# Patient Record
Sex: Male | Born: 1975 | Race: White | Hispanic: No | Marital: Married | Smoking: Never smoker
Health system: Southern US, Community
[De-identification: ages and names within clinical notes are randomized; demographics above are authoritative.]

## PROBLEM LIST (undated history)

## (undated) DIAGNOSIS — I10 Essential (primary) hypertension: Secondary | ICD-10-CM

## (undated) DIAGNOSIS — Z803 Family history of malignant neoplasm of breast: Secondary | ICD-10-CM

## (undated) DIAGNOSIS — C801 Malignant (primary) neoplasm, unspecified: Secondary | ICD-10-CM

## (undated) HISTORY — DX: Malignant (primary) neoplasm, unspecified: C80.1

## (undated) HISTORY — DX: Family history of malignant neoplasm of breast: Z80.3

---

## 2017-03-05 DIAGNOSIS — Z23 Encounter for immunization: Secondary | ICD-10-CM | POA: Diagnosis not present

## 2017-07-24 DIAGNOSIS — M9902 Segmental and somatic dysfunction of thoracic region: Secondary | ICD-10-CM | POA: Diagnosis not present

## 2017-07-24 DIAGNOSIS — M542 Cervicalgia: Secondary | ICD-10-CM | POA: Diagnosis not present

## 2017-07-24 DIAGNOSIS — M9901 Segmental and somatic dysfunction of cervical region: Secondary | ICD-10-CM | POA: Diagnosis not present

## 2017-07-28 DIAGNOSIS — M9902 Segmental and somatic dysfunction of thoracic region: Secondary | ICD-10-CM | POA: Diagnosis not present

## 2017-07-28 DIAGNOSIS — M542 Cervicalgia: Secondary | ICD-10-CM | POA: Diagnosis not present

## 2017-07-28 DIAGNOSIS — M9901 Segmental and somatic dysfunction of cervical region: Secondary | ICD-10-CM | POA: Diagnosis not present

## 2017-07-31 DIAGNOSIS — M542 Cervicalgia: Secondary | ICD-10-CM | POA: Diagnosis not present

## 2017-07-31 DIAGNOSIS — M9902 Segmental and somatic dysfunction of thoracic region: Secondary | ICD-10-CM | POA: Diagnosis not present

## 2017-07-31 DIAGNOSIS — M9901 Segmental and somatic dysfunction of cervical region: Secondary | ICD-10-CM | POA: Diagnosis not present

## 2017-11-05 DIAGNOSIS — Z0000 Encounter for general adult medical examination without abnormal findings: Secondary | ICD-10-CM | POA: Diagnosis not present

## 2017-11-05 DIAGNOSIS — Z23 Encounter for immunization: Secondary | ICD-10-CM | POA: Diagnosis not present

## 2017-11-12 DIAGNOSIS — Z0000 Encounter for general adult medical examination without abnormal findings: Secondary | ICD-10-CM | POA: Diagnosis not present

## 2018-03-16 DIAGNOSIS — Z23 Encounter for immunization: Secondary | ICD-10-CM | POA: Diagnosis not present

## 2018-04-02 DIAGNOSIS — R198 Other specified symptoms and signs involving the digestive system and abdomen: Secondary | ICD-10-CM | POA: Diagnosis not present

## 2018-04-02 DIAGNOSIS — K649 Unspecified hemorrhoids: Secondary | ICD-10-CM | POA: Diagnosis not present

## 2018-04-04 ENCOUNTER — Encounter (HOSPITAL_COMMUNITY): Payer: Self-pay | Admitting: Internal Medicine

## 2018-04-04 ENCOUNTER — Inpatient Hospital Stay (HOSPITAL_COMMUNITY)
Admission: EM | Admit: 2018-04-04 | Discharge: 2018-04-11 | DRG: 330 | Disposition: A | Discharge status: Home / Self Care | Payer: 59 | Attending: General Surgery | Admitting: General Surgery

## 2018-04-04 ENCOUNTER — Other Ambulatory Visit: Payer: Self-pay

## 2018-04-04 DIAGNOSIS — K922 Gastrointestinal hemorrhage, unspecified: Secondary | ICD-10-CM | POA: Diagnosis present

## 2018-04-04 DIAGNOSIS — C187 Malignant neoplasm of sigmoid colon: Secondary | ICD-10-CM | POA: Diagnosis not present

## 2018-04-04 DIAGNOSIS — C188 Malignant neoplasm of overlapping sites of colon: Secondary | ICD-10-CM | POA: Diagnosis not present

## 2018-04-04 DIAGNOSIS — K594 Anal spasm: Secondary | ICD-10-CM | POA: Diagnosis present

## 2018-04-04 DIAGNOSIS — Z79899 Other long term (current) drug therapy: Secondary | ICD-10-CM | POA: Diagnosis not present

## 2018-04-04 DIAGNOSIS — D649 Anemia, unspecified: Secondary | ICD-10-CM | POA: Diagnosis not present

## 2018-04-04 DIAGNOSIS — C186 Malignant neoplasm of descending colon: Secondary | ICD-10-CM | POA: Diagnosis not present

## 2018-04-04 DIAGNOSIS — C19 Malignant neoplasm of rectosigmoid junction: Principal | ICD-10-CM | POA: Diagnosis present

## 2018-04-04 DIAGNOSIS — D490 Neoplasm of unspecified behavior of digestive system: Secondary | ICD-10-CM | POA: Diagnosis not present

## 2018-04-04 DIAGNOSIS — D62 Acute posthemorrhagic anemia: Secondary | ICD-10-CM | POA: Diagnosis not present

## 2018-04-04 DIAGNOSIS — E876 Hypokalemia: Secondary | ICD-10-CM

## 2018-04-04 DIAGNOSIS — F419 Anxiety disorder, unspecified: Secondary | ICD-10-CM | POA: Diagnosis present

## 2018-04-04 DIAGNOSIS — K921 Melena: Secondary | ICD-10-CM | POA: Diagnosis not present

## 2018-04-04 DIAGNOSIS — R55 Syncope and collapse: Secondary | ICD-10-CM | POA: Diagnosis not present

## 2018-04-04 DIAGNOSIS — K625 Hemorrhage of anus and rectum: Secondary | ICD-10-CM | POA: Diagnosis not present

## 2018-04-04 DIAGNOSIS — K648 Other hemorrhoids: Secondary | ICD-10-CM | POA: Diagnosis present

## 2018-04-04 DIAGNOSIS — D123 Benign neoplasm of transverse colon: Secondary | ICD-10-CM | POA: Diagnosis not present

## 2018-04-04 DIAGNOSIS — K5669 Other partial intestinal obstruction: Secondary | ICD-10-CM | POA: Diagnosis not present

## 2018-04-04 LAB — CBC
HCT: 26.4 % — ABNORMAL LOW (ref 39.0–52.0)
Hemoglobin: 8.2 g/dL — ABNORMAL LOW (ref 13.0–17.0)
MCH: 25.7 pg — ABNORMAL LOW (ref 26.0–34.0)
MCHC: 31.1 g/dL (ref 30.0–36.0)
MCV: 82.8 fL (ref 80.0–100.0)
NRBC: 0 % (ref 0.0–0.2)
PLATELETS: 202 10*3/uL (ref 150–400)
RBC: 3.19 MIL/uL — ABNORMAL LOW (ref 4.22–5.81)
RDW: 12.4 % (ref 11.5–15.5)
WBC: 11.8 10*3/uL — ABNORMAL HIGH (ref 4.0–10.5)

## 2018-04-04 LAB — COMPREHENSIVE METABOLIC PANEL
ALK PHOS: 47 U/L (ref 38–126)
ALT: 14 U/L (ref 0–44)
ANION GAP: 9 (ref 5–15)
AST: 18 U/L (ref 15–41)
Albumin: 3.9 g/dL (ref 3.5–5.0)
BILIRUBIN TOTAL: 0.4 mg/dL (ref 0.3–1.2)
BUN: 15 mg/dL (ref 6–20)
CALCIUM: 8.5 mg/dL — AB (ref 8.9–10.3)
CO2: 23 mmol/L (ref 22–32)
Chloride: 103 mmol/L (ref 98–111)
Creatinine, Ser: 1.17 mg/dL (ref 0.61–1.24)
Glucose, Bld: 123 mg/dL — ABNORMAL HIGH (ref 70–99)
Potassium: 3.4 mmol/L — ABNORMAL LOW (ref 3.5–5.1)
Sodium: 135 mmol/L (ref 135–145)
TOTAL PROTEIN: 6.8 g/dL (ref 6.5–8.1)

## 2018-04-04 LAB — ABO/RH: ABO/RH(D): A POS

## 2018-04-04 LAB — I-STAT CHEM 8, ED
BUN: 13 mg/dL (ref 6–20)
CALCIUM ION: 1.13 mmol/L — AB (ref 1.15–1.40)
CHLORIDE: 103 mmol/L (ref 98–111)
CREATININE: 1.2 mg/dL (ref 0.61–1.24)
GLUCOSE: 121 mg/dL — AB (ref 70–99)
HCT: 24 % — ABNORMAL LOW (ref 39.0–52.0)
Hemoglobin: 8.2 g/dL — ABNORMAL LOW (ref 13.0–17.0)
Potassium: 3.5 mmol/L (ref 3.5–5.1)
Sodium: 138 mmol/L (ref 135–145)
TCO2: 23 mmol/L (ref 22–32)

## 2018-04-04 LAB — PREPARE RBC (CROSSMATCH)

## 2018-04-04 LAB — RETICULOCYTES
IMMATURE RETIC FRACT: 29.3 % — AB (ref 2.3–15.9)
RBC.: 3.11 MIL/uL — AB (ref 4.22–5.81)
RETIC CT PCT: 2.7 % (ref 0.4–3.1)
Retic Count, Absolute: 84.3 10*3/uL (ref 19.0–186.0)

## 2018-04-04 LAB — TROPONIN I

## 2018-04-04 MED ORDER — SODIUM CHLORIDE 0.9 % IV SOLN
10.0000 mL/h | Freq: Once | INTRAVENOUS | Status: AC
  Administered 2018-04-04: 10 mL/h via INTRAVENOUS

## 2018-04-04 MED ORDER — SODIUM CHLORIDE 0.9% IV SOLUTION: Freq: Once | INTRAVENOUS | Status: DC

## 2018-04-04 MED ORDER — ACETAMINOPHEN 650 MG RE SUPP: 650.0000 mg | Freq: Four times a day (QID) | RECTAL | Status: DC | PRN

## 2018-04-04 MED ORDER — HYDROCODONE-ACETAMINOPHEN 5-325 MG PO TABS
1.0000 | ORAL_TABLET | ORAL | Status: DC | PRN
  Administered 2018-04-06 (×2): 2 via ORAL
  Filled 2018-04-04 (×2): qty 2

## 2018-04-04 MED ORDER — POTASSIUM CHLORIDE CRYS ER 20 MEQ PO TBCR
40.0000 meq | EXTENDED_RELEASE_TABLET | Freq: Once | ORAL | Status: AC
  Administered 2018-04-04: 40 meq via ORAL
  Filled 2018-04-04: qty 2

## 2018-04-04 MED ORDER — ACETAMINOPHEN 325 MG PO TABS: 650.0000 mg | ORAL_TABLET | Freq: Four times a day (QID) | ORAL | Status: DC | PRN

## 2018-04-04 MED ORDER — ONDANSETRON HCL 4 MG/2ML IJ SOLN: 4.0000 mg | Freq: Four times a day (QID) | INTRAMUSCULAR | Status: DC | PRN

## 2018-04-04 MED ORDER — SODIUM CHLORIDE 0.9 % IV BOLUS
1000.0000 mL | Freq: Once | INTRAVENOUS | Status: AC
  Administered 2018-04-04: 1000 mL via INTRAVENOUS

## 2018-04-04 MED ORDER — ONDANSETRON HCL 4 MG PO TABS: 4.0000 mg | ORAL_TABLET | Freq: Four times a day (QID) | ORAL | Status: DC | PRN

## 2018-04-04 NOTE — ED Provider Notes (Signed)
[facility] DEPT Provider Note   CSN: [id number] Arrival date & time: 04/04/18  1923     History   Chief Complaint Chief Complaint  Patient presents with  . Rectal Bleeding    HPI Charles Rowland is a 42 y.o. male.  HPI Patient has been passing bright red blood per rectum for the past 3 days.  Had a witnessed syncopal episode on the toilet today while passing blood.  Last roughly 30 seconds.  No trauma.  Patient currently is denying any abdominal pain.  States he has episodic lightheadedness especially with standing.  Has never seen a gastroenterologist.  Patient Active Problem List   Diagnosis Date Noted  . Rectal bleed 04/05/2018  . Bright red blood per rectum 04/04/2018  . Lower GI bleed 04/04/2018          Home Medications    Prior to Admission medications   Medication Sig Start Date End Date Taking? Authorizing Provider  hydrocortisone 2.5 % cream Apply 1 application topically as needed for irritation. 04/02/18  Yes [provider]  ibuprofen (ADVIL,MOTRIN) 200 MG tablet Take 400 mg by mouth every 6 (six) hours as needed for moderate pain.   Yes [provider]  Lactobacillus (PROBIOTIC ACIDOPHILUS PO) Take 1 tablet by mouth daily.   Yes [provider]  psyllium (REGULOID) 0.52 g capsule Take 0.52 g by mouth daily.   Yes [provider]    Family History Family History  Problem Relation Age of Onset  . Lung cancer Other   . CAD Neg Hx   . Stroke Neg Hx   . Hypertension Neg Hx     Social History Social History   Tobacco Use  . Smoking status: Never Smoker  . Smokeless tobacco: Never Used  Substance Use Topics  . Alcohol use: Yes    Comment: occasional  . Drug use: Never     Allergies   Patient has no known allergies.   Review of Systems Review of Systems  Constitutional: Positive for fatigue. Negative for chills and fever.  HENT: Negative for trouble swallowing.   Eyes:  Negative for visual disturbance.  Respiratory: Negative for cough, shortness of breath and wheezing.   Cardiovascular: Negative for chest pain, palpitations and leg swelling.  Gastrointestinal: Positive for anal bleeding and blood in stool. Negative for abdominal pain, constipation, diarrhea, nausea and vomiting.  Genitourinary: Negative for dysuria, flank pain and frequency.  Musculoskeletal: Negative for back pain, myalgias, neck pain and neck stiffness.  Skin: Positive for pallor. Negative for rash and wound.  Neurological: Positive for syncope and light-headedness. Negative for weakness, numbness and headaches.  All other systems reviewed and are negative.    Physical Exam Updated Vital Signs BP (!) 149/89 (BP Location: Left Arm)   Pulse (!) 105   Temp 98.5 F (36.9 C) (Oral)   Resp 16   Ht 5\' 10"  (1.778 m)   Wt 88.7 kg   SpO2 95%   BMI 28.06 kg/m   Physical Exam  Constitutional: He is oriented to person, place, and time. He appears well-developed and well-nourished. No distress.  HENT:  Head: Normocephalic and atraumatic.  Mouth/Throat: Oropharynx is clear and moist. No oropharyngeal exudate.  Pale mucous membranes  Eyes: Pupils are equal, round, and reactive to light. EOM are normal.  Neck: Normal range of motion. Neck supple. No JVD present.  Cardiovascular: Normal rate and regular rhythm. Exam reveals no gallop and no friction rub.  No murmur heard. Pulmonary/Chest:  Effort normal and breath sounds normal.  Abdominal: Soft. Bowel sounds are normal. There is no tenderness. There is no rebound and no guarding.  Genitourinary:  Genitourinary Comments: No obvious fissures or bleeding hemorrhoids.  Rectal vault is clear.  Musculoskeletal: Normal range of motion. He exhibits no edema or tenderness.  Lymphadenopathy:    He has no cervical adenopathy.  Neurological: He is alert and oriented to person, place, and time.  Moving all extremities without focal deficit.  Sensation  intact.  Skin: Skin is warm and dry. Capillary refill takes less than 2 seconds. No rash noted. No erythema. There is pallor.  Psychiatric: He has a normal mood and affect. His behavior is normal.  Nursing note and vitals reviewed.    ED Treatments / Results  Labs (all labs ordered are listed, but only abnormal results are displayed) Labs Reviewed  COMPREHENSIVE METABOLIC PANEL - Abnormal; Notable for the following components:      Result Value   Potassium 3.4 (*)    Glucose, Bld 123 (*)    Calcium 8.5 (*)    All other components within normal limits  CBC - Abnormal; Notable for the following components:   WBC 11.8 (*)    RBC 3.19 (*)    Hemoglobin 8.2 (*)    HCT 26.4 (*)    MCH 25.7 (*)    All other components within normal limits  FERRITIN - Abnormal; Notable for the following components:   Ferritin 6 (*)    All other components within normal limits  IRON AND TIBC - Abnormal; Notable for the following components:   Iron 21 (*)    Saturation Ratios 6 (*)    All other components within normal limits  RETICULOCYTES - Abnormal; Notable for the following components:   RBC. 3.11 (*)    Immature Retic Fract 29.3 (*)    All other components within normal limits  COMPREHENSIVE METABOLIC PANEL - Abnormal; Notable for the following components:   Calcium 8.2 (*)    Total Protein 6.0 (*)    Albumin 3.4 (*)    All other components within normal limits  CBC - Abnormal; Notable for the following components:   RBC 3.21 (*)    Hemoglobin 8.6 (*)    HCT 27.2 (*)    All other components within normal limits  CBC - Abnormal; Notable for the following components:   RBC 3.37 (*)    Hemoglobin 9.1 (*)    HCT 28.5 (*)    All other components within normal limits  CBC - Abnormal; Notable for the following components:   RBC 3.46 (*)    Hemoglobin 9.2 (*)    HCT 29.1 (*)    All other components within normal limits  CBC - Abnormal; Notable for the following components:   RBC 3.48 (*)      Hemoglobin 9.4 (*)    HCT 29.5 (*)    All other components within normal limits  CBC - Abnormal; Notable for the following components:   RBC 3.28 (*)    Hemoglobin 8.8 (*)    HCT 27.4 (*)    All other components within normal limits  CBC - Abnormal; Notable for the following components:   RBC 3.52 (*)    Hemoglobin 9.4 (*)    HCT 29.6 (*)    All other components within normal limits  CBC - Abnormal; Notable for the following components:   RBC 3.40 (*)    Hemoglobin 9.1 (*)    HCT  28.9 (*)    All other components within normal limits  CBC - Abnormal; Notable for the following components:   RBC 3.32 (*)    Hemoglobin 8.9 (*)    HCT 28.4 (*)    All other components within normal limits  BASIC METABOLIC PANEL - Abnormal; Notable for the following components:   Glucose, Bld 116 (*)    Calcium 8.8 (*)    All other components within normal limits  I-STAT CHEM 8, ED - Abnormal; Notable for the following components:   Glucose, Bld 121 (*)    Calcium, Ion 1.13 (*)    Hemoglobin 8.2 (*)    HCT 24.0 (*)    All other components within normal limits  MRSA PCR SCREENING  FOLATE  TROPONIN I  VITAMIN B12  TROPONIN I  TROPONIN I  MAGNESIUM  PHOSPHORUS  TSH  HIV ANTIBODY (ROUTINE TESTING W REFLEX)  TROPONIN I  CEA  HEMOGLOBIN A1C  CBC  CREATININE, SERUM  TYPE AND SCREEN  PREPARE RBC (CROSSMATCH)  ABO/RH  SURGICAL PATHOLOGY  SURGICAL PATHOLOGY    EKG EKG Interpretation  Date/Time:  Saturday April 04 2018 20:48:06 EDT Ventricular Rate:  89 PR Interval:    QRS Duration: 111 QT Interval:  370 QTC Calculation: 451 R Axis:   66 Text Interpretation:  Sinus rhythm No previous ECGs available Confirmed by Theotis Burrow [phone]) on 04/05/2018 5:04:09 PM   Radiology Ct Chest W Contrast  Result Date: 04/06/2018 CLINICAL DATA:  42 year old male with newly diagnosed rectal mass at colonoscopy today. Evaluate for metastatic disease. EXAM: CT CHEST, ABDOMEN, AND PELVIS WITH  CONTRAST TECHNIQUE: Multidetector CT imaging of the chest, abdomen and pelvis was performed following the standard protocol during bolus administration of intravenous contrast. CONTRAST:  160mL OMNIPAQUE IOHEXOL 300 MG/ML  SOLN COMPARISON:  None. FINDINGS: CT CHEST FINDINGS Cardiovascular: No significant vascular findings. Normal heart size. No pericardial effusion. Mediastinum/Nodes: No enlarged mediastinal, hilar, or axillary lymph nodes. Thyroid gland, trachea, and esophagus demonstrate no significant findings. Lungs/Pleura: Lungs are clear. No pulmonary nodules or masses. No pleural effusion or pneumothorax. Musculoskeletal: No chest wall mass or suspicious bone lesions identified. CT ABDOMEN PELVIS FINDINGS Hepatobiliary: The liver and gallbladder are unremarkable. No focal hepatic lesions identified. No biliary dilatation. Pancreas: Unremarkable Spleen: Unremarkable Adrenals/Urinary Tract: The kidneys, adrenal glands and bladder are unremarkable except for bilateral renal cysts. Stomach/Bowel: Apparent circumferential wall thickening of the sigmoid colon (series 2: Image [id number]) may represent this patient's known colonic mass. No adjacent enlarged lymph nodes. No other bowel abnormalities are identified. Vascular/Lymphatic: No significant vascular findings are present. No enlarged abdominal or pelvic lymph nodes. Reproductive: Prostate is unremarkable. Other: No ascites, pneumoperitoneum, focal collection or peritoneal/omental nodularity. Musculoskeletal: No acute or suspicious bony abnormalities. IMPRESSION: 1. Apparent circumferential wall thickening of the sigmoid colon which may represent this patient's known colonic mass. No evidence of enlarged lymph nodes or metastatic disease. 2. No other significant abnormalities. Electronically Signed   By: Margarette Canada M.D.   On: 04/06/2018 20:24   Ct Abdomen Pelvis W Contrast  Result Date: 04/06/2018 CLINICAL DATA:  42 year old male with newly diagnosed rectal  mass at colonoscopy today. Evaluate for metastatic disease. EXAM: CT CHEST, ABDOMEN, AND PELVIS WITH CONTRAST TECHNIQUE: Multidetector CT imaging of the chest, abdomen and pelvis was performed following the standard protocol during bolus administration of intravenous contrast. CONTRAST:  124mL OMNIPAQUE IOHEXOL 300 MG/ML  SOLN COMPARISON:  None. FINDINGS: CT CHEST FINDINGS Cardiovascular: No significant vascular findings. Normal heart  size. No pericardial effusion. Mediastinum/Nodes: No enlarged mediastinal, hilar, or axillary lymph nodes. Thyroid gland, trachea, and esophagus demonstrate no significant findings. Lungs/Pleura: Lungs are clear. No pulmonary nodules or masses. No pleural effusion or pneumothorax. Musculoskeletal: No chest wall mass or suspicious bone lesions identified. CT ABDOMEN PELVIS FINDINGS Hepatobiliary: The liver and gallbladder are unremarkable. No focal hepatic lesions identified. No biliary dilatation. Pancreas: Unremarkable Spleen: Unremarkable Adrenals/Urinary Tract: The kidneys, adrenal glands and bladder are unremarkable except for bilateral renal cysts. Stomach/Bowel: Apparent circumferential wall thickening of the sigmoid colon (series 2: Image [id number]) may represent this patient's known colonic mass. No adjacent enlarged lymph nodes. No other bowel abnormalities are identified. Vascular/Lymphatic: No significant vascular findings are present. No enlarged abdominal or pelvic lymph nodes. Reproductive: Prostate is unremarkable. Other: No ascites, pneumoperitoneum, focal collection or peritoneal/omental nodularity. Musculoskeletal: No acute or suspicious bony abnormalities. IMPRESSION: 1. Apparent circumferential wall thickening of the sigmoid colon which may represent this patient's known colonic mass. No evidence of enlarged lymph nodes or metastatic disease. 2. No other significant abnormalities. Electronically Signed   By: Margarette Canada M.D.   On: 04/06/2018 20:24     Procedures Procedures (including critical care time)  Medications Ordered in ED Medications  0.9 %  sodium chloride infusion (Manually program via Guardrails IV Fluids) ( Intravenous MAR Unhold 04/08/18 1443)  ondansetron (ZOFRAN) tablet 4 mg ( Oral MAR Unhold 04/08/18 1443)    Or  ondansetron (ZOFRAN) injection 4 mg ( Intravenous MAR Unhold 04/08/18 1443)  hydrALAZINE (APRESOLINE) injection 10 mg ( Intravenous MAR Unhold 04/08/18 1443)  sodium chloride 0.9 % injection (has no administration in time range)  dextrose 5 % and 0.9 % NaCl with KCl 20 mEq/L infusion ( Intravenous New Bag/Given 04/07/18 2221)  naloxone (NARCAN) injection 0.4 mg (has no administration in time range)    And  sodium chloride flush (NS) 0.9 % injection 9 mL (has no administration in time range)  ondansetron (ZOFRAN) injection 4 mg (has no administration in time range)  diphenhydrAMINE (BENADRYL) injection 12.5 mg (has no administration in time range)    Or  diphenhydrAMINE (BENADRYL) 12.5 MG/5ML elixir 12.5 mg (has no administration in time range)  morphine 2 mg/mL PCA injection ( Intravenous Received 04/08/18 1425)  enoxaparin (LOVENOX) injection 40 mg (has no administration in time range)  alvimopan (ENTEREG) capsule 12 mg (has no administration in time range)  HYDROmorphone (DILAUDID) 1 MG/ML injection (has no administration in time range)  HYDROmorphone (DILAUDID) 1 MG/ML injection (has no administration in time range)  hydrALAZINE (APRESOLINE) 20 MG/ML injection (has no administration in time range)  sodium chloride 0.9 % bolus 1,000 mL (0 mLs Intravenous Stopped 04/04/18 2027)  0.9 %  sodium chloride infusion (0 mL/hr Intravenous Stopped 04/05/18 0224)  potassium chloride SA (K-DUR,KLOR-CON) CR tablet 40 mEq (40 mEq Oral Given 04/04/18 2158)  polyethylene glycol-electrolytes (NuLYTELY/GoLYTELY) solution 4,000 mL (4,000 mLs Oral Given 04/05/18 1650)  ferumoxytol (FERAHEME) 510 mg in sodium chloride 0.9 % 100 mL  IVPB (510 mg Intravenous New Bag/Given 04/05/18 2036)  iohexol (OMNIPAQUE) 300 MG/ML solution 100 mL (100 mLs Intravenous Contrast Given 04/06/18 1813)  ALPRAZolam (XANAX) tablet 0.25 mg (0.25 mg Oral Given 04/06/18 2221)  Chlorhexidine Gluconate Cloth 2 % PADS 6 each (6 each Topical Given 04/07/18 2002)  cefoTEtan (CEFOTAN) 2 g in sodium chloride 0.9 % 100 mL IVPB ( Intravenous Anesthesia Volume Adjustment 04/08/18 1300)  alvimopan (ENTEREG) capsule 12 mg (12 mg Oral Given 04/08/18 0912)  gabapentin (NEURONTIN) capsule  300 mg (300 mg Oral Given 04/08/18 0912)  acetaminophen (TYLENOL) tablet 1,000 mg (1,000 mg Oral Given 04/08/18 0913)  hydrALAZINE (APRESOLINE) injection 10 mg (10 mg Intravenous Given 04/08/18 1404)    CRITICAL CARE Performed by: Julianne Rice Total critical care time: 35 minutes Critical care time was exclusive of separately billable procedures and treating other patients. Critical care was necessary to treat or prevent imminent or life-threatening deterioration. Critical care was time spent personally by me on the following activities: development of treatment plan with patient and/or surrogate as well as nursing, discussions with consultants, evaluation of patient's response to treatment, examination of patient, obtaining history from patient or surrogate, ordering and performing treatments and interventions, ordering and review of laboratory studies, ordering and review of radiographic studies, pulse oximetry and re-evaluation of patient's condition. Initial Impression / Assessment and Plan / ED Course  I have reviewed the triage vital signs and the nursing notes.  Pertinent labs & imaging results that were available during my care of the patient were reviewed by me and considered in my medical decision making (see chart for details).     Patient states that when he had his hemoglobin checked 3 days ago it was 11.  It is 8.2 today.  Will consult gastroenterology and have  hospitalist admit.  Go ahead and transfuse 2 units of packed red blood cells given he is symptomatic.    Final Clinical Impressions(s) / ED Diagnoses   Final diagnoses:  Lower GI bleed    ED Discharge Orders    None       Julianne Rice, MD 04/08/18 1550

## 2018-04-04 NOTE — H&P (Signed)
Charles Rowland DZH:[id number] DOB: [birth year] DOA: 04/04/2018     PCP: Dineen Kid, MD   Outpatient Specialists:  NONE   Patient arrived to ER on 04/04/18 at 1923  Patient coming from: home Lives   With family    Chief Complaint:  Chief Complaint  Patient presents with  . Rectal Bleeding    HPI: Charles Rowland is a 42 y.o. male with no significant medical history      Presented with BRBPR for the past 3 days he have had a  Ws seen by PCP 3 days ago hg was 11 now 8.2 He woke up this AM had bloody BM at 4 AM had another in 20 min slightly better then 30 min later he went again and he had an episode of Syncope today while on the commode. He was diaphoretic no chest pain no shortness of breath. That was his last BM around 5:30 AM  Even yesterday he has been feeling dizzy and somewhat worse shortness of breath with dyspnea.  HE took Ibuprofen today 400 mg MG twice due to a headache He has been taking Metamucil No abdominal pain  While in ER: HG 8.2  The following Work up has been ordered so far:  Orders Placed This Encounter  Procedures  . Comprehensive metabolic panel  . CBC  . Diet NPO time specified  . Place X2 Large Bore IV's  . Initiate Carrier Fluid Protocol  . Complete patient signature process for consent form  . Practitioner attestation of consent  . Consult to gastroenterology  . Consult to hospitalist  . Consult to gastroenterology  . Pulse oximetry, continuous  . POC occult blood, ED  . I-stat chem 8, ed  . ED EKG  . Type and screen [place]  . Prepare RBC  . ABO/Rh    Following Medications were ordered in ER: Medications  0.9 %  sodium chloride infusion (has no administration in time range)  sodium chloride 0.9 % bolus 1,000 mL (1,000 mLs Intravenous New Bag/Given 04/04/18 1956)    Significant initial  Findings: Abnormal Labs Reviewed  COMPREHENSIVE METABOLIC PANEL - Abnormal; Notable for the following components:   Result Value   Potassium 3.4 (*)    Glucose, Bld 123 (*)    Calcium 8.5 (*)    All other components within normal limits  CBC - Abnormal; Notable for the following components:   WBC 11.8 (*)    RBC 3.19 (*)    Hemoglobin 8.2 (*)    HCT 26.4 (*)    MCH 25.7 (*)    All other components within normal limits  I-STAT CHEM 8, ED - Abnormal; Notable for the following components:   Glucose, Bld 121 (*)    Calcium, Ion 1.13 (*)    Hemoglobin 8.2 (*)    HCT 24.0 (*)    All other components within normal limits     Na 138 K 3.5 BUN 15 Cr    stable,    Lab Results  Component Value Date   CREATININE 1.20 04/04/2018   CREATININE 1.17 04/04/2018      WBC  11.8  HG/HCT  Down   from baseline see below    Component Value Date/Time   HGB 8.2 (L) 04/04/2018 1959   HCT 24.0 (L) 04/04/2018 1959    Troponin (Point of Care Test) No results for input(s): TROPIPOC in the last 72 hours.      UA  not ordered   ECG:  Personally reviewed by me showing: HR : 89 Rhythm:  NSR,  I  no evidence of ischemic changes QTC 451      ED Triage Vitals  Enc Vitals Group     BP 04/04/18 1938 (!) 129/95     Pulse Rate 04/04/18 1938 100     Resp 04/04/18 1938 19     Temp --      Temp src --      SpO2 04/04/18 1936 100 %     Weight --      Height --      Head Circumference --      Peak Flow --      Pain Score 04/04/18 1939 2     Pain Loc --      Pain Edu? --      Excl. in [place]? --   TMAX(24)@       Latest  Blood pressure (!) 149/89, pulse 85, resp. rate 12, SpO2 100 %.  ER Provider Called:     Dr.Brahambhatt They Recommend NPO post midnight, serial CBC Will see in AM   Hospitalist was called for admission for Lower Gi bleed   Review of Systems:    Pertinent positives include:  blood in stool, shortness of breath at rest.   dyspnea on exertion,  Constitutional:  No weight loss, night sweats, Fevers, chills, fatigue, weight loss  HEENT:  No headaches, Difficulty  swallowing,Tooth/dental problems,Sore throat,  No sneezing, itching, ear ache, nasal congestion, post nasal drip,  Cardio-vascular:  No chest pain, Orthopnea, PND, anasarca, dizziness, palpitations.no Bilateral lower extremity swelling  GI:  No heartburn, indigestion, abdominal pain, nausea, vomiting, diarrhea, change in bowel habits, loss of appetite, melena,hematemesis Resp:  no  No excess mucus, no productive cough, No non-productive cough, No coughing up of blood.No change in color of mucus.No wheezing. Skin:  no rash or lesions. No jaundice GU:  no dysuria, change in color of urine, no urgency or frequency. No straining to urinate.  No flank pain.  Musculoskeletal:  No joint pain or no joint swelling. No decreased range of motion. No back pain.  Psych:  No change in mood or affect. No depression or anxiety. No memory loss.  Neuro: no localizing neurological complaints, no tingling, no weakness, no double vision, no gait abnormality, no slurred speech, no confusion  All systems reviewed and apart from [place] all are negative  Past Medical History:  History reviewed. No pertinent past medical history.    History reviewed. No pertinent surgical history.  Social History:  Ambulatory   Independently      reports that he has never smoked. He has never used smokeless tobacco. He reports that he drinks alcohol. His drug history is not on file.     Family History:   Family History  Problem Relation Age of Onset  . Lung cancer Other   . CAD Neg Hx   . Stroke Neg Hx   . Hypertension Neg Hx     Allergies: No Known Allergies   Prior to Admission medications   Medication Sig Start Date End Date Taking? Authorizing Provider  hydrocortisone 2.5 % cream Apply 1 application topically as needed for irritation. 04/02/18  Yes [provider]  ibuprofen (ADVIL,MOTRIN) 200 MG tablet Take 400 mg by mouth every 6 (six) hours as needed for moderate pain.   Yes [provider]  Lactobacillus (PROBIOTIC ACIDOPHILUS PO) Take 1 tablet by mouth daily.   Yes [provider]  psyllium (  REGULOID) 0.52 g capsule Take 0.52 g by mouth daily.   Yes [provider]   Physical Exam: Blood pressure (!) 149/89, pulse 85, resp. rate 12, SpO2 100 %. 1. General:  in No Acute distress  well  -appearing 2. Psychological: Alert and   Oriented 3. Head/ENT:     Dry Mucous Membranes                          Head Non traumatic, neck supple                          Normal   Dentition 4. SKIN:  pale decreased Skin turgor,  Skin clean Dry and intact no rash 5. Heart: Regular rate and rhythm no  Murmur, no Rub or gallop 6. Lungs:  Clear to auscultation bilaterally, no wheezes or crackles   7. Abdomen: Soft,  non-tender, Non distended bowel sounds present 8. Lower extremities: no clubbing, cyanosis, or  edema 9. Neurologically Grossly intact, moving all 4 extremities equally  10. MSK: Normal range of motion   LABS:     Recent Labs  Lab 04/04/18 1955 04/04/18 1959  WBC 11.8*  --   HGB 8.2* 8.2*  HCT 26.4* 24.0*  MCV 82.8  --   PLT 202  --    Basic Metabolic Panel: Recent Labs  Lab 04/04/18 1955 04/04/18 1959  NA 135 138  K 3.4* 3.5  CL 103 103  CO2 23  --   GLUCOSE 123* 121*  BUN 15 13  CREATININE 1.17 1.20  CALCIUM 8.5*  --       Recent Labs  Lab 04/04/18 1955  AST 18  ALT 14  ALKPHOS 47  BILITOT 0.4  PROT 6.8  ALBUMIN 3.9   No results for input(s): LIPASE, AMYLASE in the last 168 hours. No results for input(s): AMMONIA in the last 168 hours.    HbA1C: No results for input(s): HGBA1C in the last 72 hours. CBG: No results for input(s): GLUCAP in the last 168 hours.    Urine analysis: No results found for: COLORURINE, APPEARANCEUR, LABSPEC, PHURINE, GLUCOSEU, HGBUR, BILIRUBINUR, KETONESUR, PROTEINUR, UROBILINOGEN, NITRITE, LEUKOCYTESUR     Cultures: No results found for: [place], [place], [place], REPTSTATUS     Radiological Exams on Admission: No results found.  Chart has been reviewed    Assessment/Plan  42 y.o. male with no significant medical history   Admitted for  Lower GI bleed  Present on Admission: . Bright red blood per rectum/Lower GI bleed - - Suspect Lower Gi source No hx of PUD,  melena,  BUN elevation to  suggest otherwise  - Admit  For further management given:  hemodynamic instability, gross rectal bleeding, rebleeding   -  most likely Diverticular source -  painless bleeding making colitis less likely        -   hemodynamic instability present      - Admit to stepdown given above    -  ER  Provider spoke to gastroenterology ( [facility],  ) they will see patient in a.m. appreciate their consult   - serial CBC.    - Monitor for any recurrence,  evidence of hemodynamic instability or significant blood loss -  type and screen,  - Transfuse as needed for hemoglobin below 7 or <9 if evidence of significant  bleeding  - Establish at least 2 PIV and fluid resuscitate   - clear  liquids for tonight keep nothing by mouth post midnight,  -  monitor for Recurrent significant  Bleeding of red blood and hemodynamic instability in which case Bleeding scan and IR consult would be indicated.    Symptomatic anemia - transfuse 2 units PRBC  Other plan as per orders.  DVT prophylaxis:  SCD     Code Status:  FULL CODE as per patient   I had personally discussed CODE STATUS with patient and family  Family Communication:   Family    at  Bedside  plan of care was discussed with  Wife,  Disposition Plan:         To home once workup is complete and patient is stable                                  Consults called: [facility]   Admission status:    inpatient     Expect 2 midnight stay secondary to severity of patient's current illness including    Severe lab abnormalities including  Low hg   That are currently affecting medical management.  I expect  patient to be hospitalized for  2 midnights requiring inpatient medical care.  Patient is at high risk for adverse outcome (such as loss of life or disability) if not treated.  Indication for inpatient stay as follows:      inability to maintain oral hydration    Need fo  intervention Need for  IV fluids, blood transfusion and careful monitoring   Level of care  SDU tele indefinitely please discontinue once patient no longer qualifies        Charles Rowland 04/04/2018, 9:53 PM    [facility]  Pager [phone]   after 2 AM please page floor coverage PA If 7AM-7PM, please contact the day team taking care of the patient  [email or web address]  Password [email or web address]

## 2018-04-04 NOTE — ED Notes (Signed)
Bed: WA02 Expected date:  Expected time:  Means of arrival:  Comments: Rectal bleeding, syncope

## 2018-04-04 NOTE — ED Triage Notes (Signed)
Pt to ED by [facility] with complaints of Rectal Bleeding since last Thursday. Patient has had 6 bowel movements, 4 of which were blood diarrhea and one solid with blood. Pt was diagnosed with external hemorrhoid over the summer, but was saw by his NP yesterday and did a rectal exam.  Pt since has had continued bleeding and had a witnessed syncope episode that lasted 30 seconds today. Last bowel movement with the syncope episode was at 1730. Per [facility] pt was tachy on arrival, but has subsided with 200 ml NS. Pt is A&O x4

## 2018-04-04 NOTE — ED Notes (Signed)
Pt BP dropped, placed patient in Trendelenburg

## 2018-04-05 ENCOUNTER — Encounter (HOSPITAL_COMMUNITY): Payer: Self-pay

## 2018-04-05 DIAGNOSIS — K625 Hemorrhage of anus and rectum: Secondary | ICD-10-CM | POA: Diagnosis present

## 2018-04-05 LAB — CBC
HCT: 27.2 % — ABNORMAL LOW (ref 39.0–52.0)
HEMATOCRIT: 28.5 % — AB (ref 39.0–52.0)
HEMATOCRIT: 29.1 % — AB (ref 39.0–52.0)
HEMATOCRIT: 29.5 % — AB (ref 39.0–52.0)
HEMOGLOBIN: 8.6 g/dL — AB (ref 13.0–17.0)
HEMOGLOBIN: 9.1 g/dL — AB (ref 13.0–17.0)
HEMOGLOBIN: 9.2 g/dL — AB (ref 13.0–17.0)
HEMOGLOBIN: 9.4 g/dL — AB (ref 13.0–17.0)
MCH: 26.8 pg (ref 26.0–34.0)
MCH: 27 pg (ref 26.0–34.0)
MCH: 26.6 pg (ref 26.0–34.0)
MCH: 27 pg (ref 26.0–34.0)
MCHC: 31.6 g/dL (ref 30.0–36.0)
MCHC: 31.9 g/dL (ref 30.0–36.0)
MCHC: 31.6 g/dL (ref 30.0–36.0)
MCHC: 31.9 g/dL (ref 30.0–36.0)
MCV: 84.7 fL (ref 80.0–100.0)
MCV: 84.6 fL (ref 80.0–100.0)
MCV: 84.1 fL (ref 80.0–100.0)
MCV: 84.8 fL (ref 80.0–100.0)
NRBC: 0 % (ref 0.0–0.2)
NRBC: 0 % (ref 0.0–0.2)
NRBC: 0 % (ref 0.0–0.2)
NRBC: 0 % (ref 0.0–0.2)
PLATELETS: 154 10*3/uL (ref 150–400)
Platelets: 177 10*3/uL (ref 150–400)
Platelets: 197 10*3/uL (ref 150–400)
Platelets: 189 10*3/uL (ref 150–400)
RBC: 3.21 MIL/uL — ABNORMAL LOW (ref 4.22–5.81)
RBC: 3.37 MIL/uL — AB (ref 4.22–5.81)
RBC: 3.46 MIL/uL — ABNORMAL LOW (ref 4.22–5.81)
RBC: 3.48 MIL/uL — AB (ref 4.22–5.81)
RDW: 13 % (ref 11.5–15.5)
RDW: 13 % (ref 11.5–15.5)
RDW: 13 % (ref 11.5–15.5)
RDW: 13.2 % (ref 11.5–15.5)
WBC: 5 10*3/uL (ref 4.0–10.5)
WBC: 4.6 10*3/uL (ref 4.0–10.5)
WBC: 5.2 10*3/uL (ref 4.0–10.5)
WBC: 5.4 10*3/uL (ref 4.0–10.5)

## 2018-04-05 LAB — IRON AND TIBC
IRON: 21 ug/dL — AB (ref 45–182)
Saturation Ratios: 6 % — ABNORMAL LOW (ref 17.9–39.5)
TIBC: 367 ug/dL (ref 250–450)
UIBC: 346 ug/dL

## 2018-04-05 LAB — COMPREHENSIVE METABOLIC PANEL
ALBUMIN: 3.4 g/dL — AB (ref 3.5–5.0)
ALK PHOS: 42 U/L (ref 38–126)
ALT: 13 U/L (ref 0–44)
AST: 15 U/L (ref 15–41)
Anion gap: 5 (ref 5–15)
BILIRUBIN TOTAL: 0.6 mg/dL (ref 0.3–1.2)
BUN: 14 mg/dL (ref 6–20)
CALCIUM: 8.2 mg/dL — AB (ref 8.9–10.3)
CO2: 23 mmol/L (ref 22–32)
Chloride: 109 mmol/L (ref 98–111)
Creatinine, Ser: 1.09 mg/dL (ref 0.61–1.24)
GLUCOSE: 95 mg/dL (ref 70–99)
POTASSIUM: 3.8 mmol/L (ref 3.5–5.1)
Sodium: 137 mmol/L (ref 135–145)
Total Protein: 6 g/dL — ABNORMAL LOW (ref 6.5–8.1)

## 2018-04-05 LAB — FOLATE: Folate: 15.1 ng/mL (ref >5.9)

## 2018-04-05 LAB — PHOSPHORUS: PHOSPHORUS: 2.8 mg/dL (ref 2.5–4.6)

## 2018-04-05 LAB — TROPONIN I: Troponin I: <0.03 ng/mL (ref <0.03)

## 2018-04-05 LAB — VITAMIN B12: VITAMIN B 12: 225 pg/mL (ref 180–914)

## 2018-04-05 LAB — FERRITIN: Ferritin: 6 ng/mL — ABNORMAL LOW (ref 24–336)

## 2018-04-05 LAB — MAGNESIUM: MAGNESIUM: 2 mg/dL (ref 1.7–2.4)

## 2018-04-05 LAB — TSH: TSH: 3.129 u[IU]/mL (ref 0.350–4.500)

## 2018-04-05 LAB — HIV ANTIBODY (ROUTINE TESTING W REFLEX): HIV SCREEN 4TH GENERATION: NONREACTIVE

## 2018-04-05 MED ORDER — HYDRALAZINE HCL 20 MG/ML IJ SOLN
10.0000 mg | Freq: Four times a day (QID) | INTRAMUSCULAR | Status: DC | PRN
  Administered 2018-04-05 – 2018-04-07 (×3): 10 mg via INTRAVENOUS
  Filled 2018-04-05 (×3): qty 1

## 2018-04-05 MED ORDER — SODIUM CHLORIDE 0.9 % IV SOLN
INTRAVENOUS | Status: DC
  Administered 2018-04-05 – 2018-04-06 (×3): via INTRAVENOUS

## 2018-04-05 MED ORDER — SODIUM CHLORIDE 0.9 % IV SOLN
510.0000 mg | Freq: Once | INTRAVENOUS | Status: AC
  Administered 2018-04-05: 510 mg via INTRAVENOUS
  Filled 2018-04-05: qty 17

## 2018-04-05 MED ORDER — PEG 3350-KCL-NA BICARB-NACL 420 G PO SOLR: 4000.0000 mL | Freq: Once | ORAL | Status: DC

## 2018-04-05 MED ORDER — PEG 3350-KCL-NA BICARB-NACL 420 G PO SOLR
4000.0000 mL | Freq: Once | ORAL | Status: AC
  Administered 2018-04-05: 4000 mL via ORAL

## 2018-04-05 NOTE — ED Notes (Signed)
Patient denies any needs at this time and continues to rest.

## 2018-04-05 NOTE — Consult Note (Signed)
Referring Provider:  Dr. Lita Mains Primary Care Physician:  Via, Lennette Bihari, MD Primary Gastroenterologist: Althia Forts  Reason for Consultation: Rectal bleeding  HPI: Charles Rowland is a 42 y.o. male with no significant past medical history presented to the emergency room with chief complaint of rectal bleeding with syncopal episode.  GI is consulted for further evaluation.  Patient has been having intermittent rectal bleeding since 2014 when he was diagnosed with internal hemorrhoids.   Around 4 days ago he started noticing large amount of bright red blood per rectum.  Yesterday he had an episode of syncope after bloody bowel movement.  He was found to have a drop in hemoglobin from 11 to 8.6.  Patient was complaining of generalized abdominal discomfort but denied any abdominal pain.  Denies any nausea vomiting.  Denies any weight loss.  Appetite is good.No previous colonoscopy.  No family history of colon cancer.  He has been having intermittent loose stools for last few years.  History reviewed. No pertinent past medical history.  History reviewed. No pertinent surgical history.  Prior to Admission medications   Medication Sig Start Date End Date Taking? Authorizing Provider  hydrocortisone 2.5 % cream Apply 1 application topically as needed for irritation. 04/02/18  Yes [provider]  ibuprofen (ADVIL,MOTRIN) 200 MG tablet Take 400 mg by mouth every 6 (six) hours as needed for moderate pain.   Yes [provider]  Lactobacillus (PROBIOTIC ACIDOPHILUS PO) Take 1 tablet by mouth daily.   Yes [provider]  psyllium (REGULOID) 0.52 g capsule Take 0.52 g by mouth daily.   Yes [provider]    Scheduled Meds: . sodium chloride   Intravenous Once   Continuous Infusions: PRN Meds:.acetaminophen **OR** acetaminophen, HYDROcodone-acetaminophen, ondansetron **OR** ondansetron (ZOFRAN) IV  Allergies as of 04/04/2018  . (No Known Allergies)    Family  History  Problem Relation Age of Onset  . Lung cancer Other   . CAD Neg Hx   . Stroke Neg Hx   . Hypertension Neg Hx     Social History   Socioeconomic History  . Marital status: Married    Spouse name: Not on file  . Number of children: Not on file  . Years of education: Not on file  . Highest education level: Not on file  Occupational History  . Not on file  Social Needs  . Financial resource strain: Not on file  . Food insecurity:    Worry: Not on file    Inability: Not on file  . Transportation needs:    Medical: Not on file    Non-medical: Not on file  Tobacco Use  . Smoking status: Never Smoker  . Smokeless tobacco: Never Used  Substance and Sexual Activity  . Alcohol use: Yes    Comment: occasional  . Drug use: Not on file  . Sexual activity: Not on file  Lifestyle  . Physical activity:    Days per week: Not on file    Minutes per session: Not on file  . Stress: Not on file  Relationships  . Social connections:    Talks on phone: Not on file    Gets together: Not on file    Attends religious service: Not on file    Active member of club or organization: Not on file    Attends meetings of clubs or organizations: Not on file    Relationship status: Not on file  . Intimate partner violence:    Fear of current or ex  partner: Not on file    Emotionally abused: Not on file    Physically abused: Not on file    Forced sexual activity: Not on file  Other Topics Concern  . Not on file  Social History Narrative  . Not on file    Review of Systems: Review of Systems  Constitutional: Negative for chills, fever, malaise/fatigue and weight loss.  HENT: Negative for hearing loss and tinnitus.   Eyes: Negative for blurred vision and double vision.  Respiratory: Negative for cough and hemoptysis.   Cardiovascular: Negative for chest pain and palpitations.  Gastrointestinal: Positive for blood in stool. Negative for abdominal pain, heartburn, nausea and vomiting.   Genitourinary: Negative for dysuria and urgency.  Musculoskeletal: Negative for myalgias and neck pain.  Skin: Negative for itching and rash.  Neurological: Positive for dizziness. Negative for speech change and seizures.  Endo/Heme/Allergies: Does not bruise/bleed easily.  Psychiatric/Behavioral: Negative for hallucinations and suicidal ideas.    Physical Exam: Vital signs: Vitals:   04/05/18 0700 04/05/18 0730  BP: 126/73 (!) 132/91  Pulse: 74 86  Resp: 12 15  Temp:    SpO2: 97% 96%     Physical Exam  Constitutional: He is oriented to person, place, and time. He appears well-developed and well-nourished. No distress.  HENT:  Head: Normocephalic and atraumatic.  Mouth/Throat: Oropharynx is clear and moist. No oropharyngeal exudate.  Eyes: EOM are normal. No scleral icterus.  Neck: Normal range of motion. Neck supple.  Cardiovascular: Normal rate and regular rhythm.  Pulmonary/Chest: Effort normal and breath sounds normal.  Abdominal: Soft. Bowel sounds are normal. He exhibits no distension. There is no tenderness. There is no rebound.  Musculoskeletal: Normal range of motion. He exhibits no edema.  Neurological: He is alert and oriented to person, place, and time.  Skin: Skin is warm. No erythema.  Psychiatric: He has a normal mood and affect. Judgment and thought content normal.  Nursing note and vitals reviewed.   GI:  Lab Results: Recent Labs    04/04/18 1955 04/04/18 1959 04/05/18 0613 04/05/18 1004  WBC 11.8*  --  5.0 4.6  HGB 8.2* 8.2* 8.6* 9.1*  HCT 26.4* 24.0* 27.2* 28.5*  PLT 202  --  154 177   BMET Recent Labs    04/04/18 1955 04/04/18 1959 04/05/18 0613  NA 135 138 137  K 3.4* 3.5 3.8  CL 103 103 109  CO2 23  --  23  GLUCOSE 123* 121* 95  BUN 15 13 14   CREATININE 1.17 1.20 1.09  CALCIUM 8.5*  --  8.2*   LFT Recent Labs    04/05/18 0613  PROT 6.0*  ALBUMIN 3.4*  AST 15  ALT 13  ALKPHOS 42  BILITOT 0.6   PT/INR No results for  input(s): LABPROT, INR in the last 72 hours.   Studies/Results: No results found.  Impression/Plan: -Rectal bleeding.  Most likely hemorrhoidal.  Patient with intermittent loose stools for 2 years.  Possibility of inflammatory bowel disease cannot be ruled out. -Acute blood loss anemia.  Recommendations ---------------------------- -Colonoscopy for tomorrow. -Continue supportive care for now.  Okay to have clear liquids diet today.  N.p.o. past midnight.  Risks (bleeding, infection, bowel perforation that could require surgery, sedation-related changes in cardiopulmonary systems), benefits (identification and possible treatment of source of symptoms, exclusion of certain causes of symptoms), and alternatives (watchful waiting, radiographic imaging studies, empiric medical treatment)  were explained to patient in detail and patient wishes to proceed.  LOS: 1 day   Otis Brace  MD, FACP 04/05/2018, 10:24 AM  Contact #  [phone]

## 2018-04-05 NOTE — Progress Notes (Signed)
PROGRESS NOTE    Charles Rowland  ZJI:[id number] DOB: [birth year] DOA: 04/04/2018 PCP: Dineen Kid, MD     Brief Narrative:  42 year old man without past medical history of significance admitted from home on 11/2 due to rectal bleeding and a syncopal episode while on the commode.  His hemoglobin had dropped from 11-8.2 in the space of 3 days.  Admission was requested for further evaluation and management.   Assessment & Plan:   Active Problems:   Bright red blood per rectum   Lower GI bleed   Rectal bleed   Rectal bleeding -Etiology remains unclear. -No family history of colon cancer or inflammatory bowel disease. -He does have a history of internal hemorrhoids. 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 -Seen by GI today who was planned for colonoscopy in a.m. -Was transfused 2 units of PRBCs yesterday. -Will receive a dose of IV Feraheme today to replete iron stores and will need to continue oral iron supplementation after discharge. -Further recommendations after colonoscopy.   DVT prophylaxis: SCDs Code Status: Full code Family Communication: Wife at bedside updated on plan of care and all questions answered Disposition Plan: Colonoscopy in a.m.  Consultants:   GI  Procedures:   None  Antimicrobials:  Anti-infectives (From admission, onward)   None       Subjective: Lying in bed, no current complaints, no bleeding since this a.m. in the emergency department.  Objective: Vitals:   04/05/18 1200 04/05/18 1300 04/05/18 1400 04/05/18 1518  BP: (!) 145/89 (!) 155/98 (!) 145/92 (!) 145/95  Pulse: 84 91 96 83  Resp: 18 18 13    Temp:    98.5 F (36.9 C)  TempSrc:    Oral  SpO2: 98% 98% 97% 99%    Intake/Output Summary (Last 24 hours) at 04/05/2018 1706 Last data filed at 04/05/2018 1023 Gross per 24 hour  Intake 2185 ml  Output 700 ml  Net 1485 ml   There were no vitals filed for this  visit.  Examination:  General exam: Alert, awake, oriented x 3 Respiratory system: Clear to auscultation. Respiratory effort normal. Cardiovascular system:RRR. No murmurs, rubs, gallops. Gastrointestinal system: Abdomen is nondistended, soft and nontender. No organomegaly or masses felt. Normal bowel sounds heard. Central nervous system: Alert and oriented. No focal neurological deficits. Extremities: No C/C/E, +pedal pulses Skin: No rashes, lesions or ulcers Psychiatry: Judgement and insight appear normal. Mood & affect appropriate.     Data Reviewed: I have personally reviewed following labs and imaging studies  CBC: Recent Labs  Lab 04/04/18 1955 04/04/18 1959 04/05/18 0613 04/05/18 1004 04/05/18 1635  WBC 11.8*  --  5.0 4.6 5.2  HGB 8.2* 8.2* 8.6* 9.1* 9.2*  HCT 26.4* 24.0* 27.2* 28.5* 29.1*  MCV 82.8  --  84.7 84.6 84.1  PLT 202  --  154 177 175   Basic Metabolic Panel: Recent Labs  Lab 04/04/18 1955 04/04/18 1959 04/05/18 0613  NA 135 138 137  K 3.4* 3.5 3.8  CL 103 103 109  CO2 23  --  23  GLUCOSE 123* 121* 95  BUN 15 13 14   CREATININE 1.17 1.20 1.09  CALCIUM 8.5*  --  8.2*  MG  --   --  2.0  PHOS  --   --  2.8   GFR: CrCl cannot be calculated (Unknown ideal weight.). Liver Function Tests: Recent Labs  Lab 04/04/18 1955 04/05/18 0613  AST 18 15  ALT 14  13  ALKPHOS 47 42  BILITOT 0.4 0.6  PROT 6.8 6.0*  ALBUMIN 3.9 3.4*   No results for input(s): LIPASE, AMYLASE in the last 168 hours. No results for input(s): AMMONIA in the last 168 hours. Coagulation Profile: No results for input(s): INR, PROTIME in the last 168 hours. Cardiac Enzymes: Recent Labs  Lab 04/04/18 1950 04/05/18 0008 04/05/18 0613 04/05/18 1200  TROPONINI <0.03 <0.03 <0.03 <0.03   BNP (last 3 results) No results for input(s): PROBNP in the last 8760 hours. HbA1C: No results for input(s): HGBA1C in the last 72 hours. CBG: No results for input(s): GLUCAP in the last 168  hours. Lipid Profile: No results for input(s): CHOL, HDL, LDLCALC, TRIG, CHOLHDL, LDLDIRECT in the last 72 hours. Thyroid Function Tests: Recent Labs    04/05/18 0613  TSH 3.129   Anemia Panel: Recent Labs    04/04/18 1950 04/05/18 1201  VITAMINB12  --  225  FOLATE  --  15.1  FERRITIN  --  6*  TIBC  --  367  IRON  --  21*  RETICCTPCT 2.7  --    Urine analysis: No results found for: COLORURINE, APPEARANCEUR, LABSPEC, PHURINE, GLUCOSEU, HGBUR, BILIRUBINUR, KETONESUR, PROTEINUR, UROBILINOGEN, NITRITE, LEUKOCYTESUR Sepsis Labs: @LABRCNTIP (procalcitonin:4,lacticidven:4)  )No results found for this or any previous visit (from the past 240 hour(s)).       Radiology Studies: No results found.      Scheduled Meds: . sodium chloride   Intravenous Once   Continuous Infusions: . ferumoxytol       LOS: 1 day    Time spent: 25 minutes.     Lelon Frohlich, MD [facility] Pager [phone]  If 7PM-7AM, please contact night-coverage [email or web address] Password [email or web address] 04/05/2018, 5:06 PM

## 2018-04-05 NOTE — H&P (View-Only) (Signed)
Referring Provider:  Dr. Lita Mains Primary Care Physician:  Via, Lennette Bihari, MD Primary Gastroenterologist: Althia Forts  Reason for Consultation: Rectal bleeding  HPI: Charles Rowland is a 42 y.o. male with no significant past medical history presented to the emergency room with chief complaint of rectal bleeding with syncopal episode.  GI is consulted for further evaluation.  Patient has been having intermittent rectal bleeding since 2014 when he was diagnosed with internal hemorrhoids.   Around 4 days ago he started noticing large amount of bright red blood per rectum.  Yesterday he had an episode of syncope after bloody bowel movement.  He was found to have a drop in hemoglobin from 11 to 8.6.  Patient was complaining of generalized abdominal discomfort but denied any abdominal pain.  Denies any nausea vomiting.  Denies any weight loss.  Appetite is good.No previous colonoscopy.  No family history of colon cancer.  He has been having intermittent loose stools for last few years.  History reviewed. No pertinent past medical history.  History reviewed. No pertinent surgical history.  Prior to Admission medications   Medication Sig Start Date End Date Taking? Authorizing Provider  hydrocortisone 2.5 % cream Apply 1 application topically as needed for irritation. 04/02/18  Yes [provider]  ibuprofen (ADVIL,MOTRIN) 200 MG tablet Take 400 mg by mouth every 6 (six) hours as needed for moderate pain.   Yes [provider]  Lactobacillus (PROBIOTIC ACIDOPHILUS PO) Take 1 tablet by mouth daily.   Yes [provider]  psyllium (REGULOID) 0.52 g capsule Take 0.52 g by mouth daily.   Yes [provider]    Scheduled Meds: . sodium chloride   Intravenous Once   Continuous Infusions: PRN Meds:.acetaminophen **OR** acetaminophen, HYDROcodone-acetaminophen, ondansetron **OR** ondansetron (ZOFRAN) IV  Allergies as of 04/04/2018  . (No Known Allergies)    Family  History  Problem Relation Age of Onset  . Lung cancer Other   . CAD Neg Hx   . Stroke Neg Hx   . Hypertension Neg Hx     Social History   Socioeconomic History  . Marital status: Married    Spouse name: Not on file  . Number of children: Not on file  . Years of education: Not on file  . Highest education level: Not on file  Occupational History  . Not on file  Social Needs  . Financial resource strain: Not on file  . Food insecurity:    Worry: Not on file    Inability: Not on file  . Transportation needs:    Medical: Not on file    Non-medical: Not on file  Tobacco Use  . Smoking status: Never Smoker  . Smokeless tobacco: Never Used  Substance and Sexual Activity  . Alcohol use: Yes    Comment: occasional  . Drug use: Not on file  . Sexual activity: Not on file  Lifestyle  . Physical activity:    Days per week: Not on file    Minutes per session: Not on file  . Stress: Not on file  Relationships  . Social connections:    Talks on phone: Not on file    Gets together: Not on file    Attends religious service: Not on file    Active member of club or organization: Not on file    Attends meetings of clubs or organizations: Not on file    Relationship status: Not on file  . Intimate partner violence:    Fear of current or ex  partner: Not on file    Emotionally abused: Not on file    Physically abused: Not on file    Forced sexual activity: Not on file  Other Topics Concern  . Not on file  Social History Narrative  . Not on file    Review of Systems: Review of Systems  Constitutional: Negative for chills, fever, malaise/fatigue and weight loss.  HENT: Negative for hearing loss and tinnitus.   Eyes: Negative for blurred vision and double vision.  Respiratory: Negative for cough and hemoptysis.   Cardiovascular: Negative for chest pain and palpitations.  Gastrointestinal: Positive for blood in stool. Negative for abdominal pain, heartburn, nausea and vomiting.   Genitourinary: Negative for dysuria and urgency.  Musculoskeletal: Negative for myalgias and neck pain.  Skin: Negative for itching and rash.  Neurological: Positive for dizziness. Negative for speech change and seizures.  Endo/Heme/Allergies: Does not bruise/bleed easily.  Psychiatric/Behavioral: Negative for hallucinations and suicidal ideas.    Physical Exam: Vital signs: Vitals:   04/05/18 0700 04/05/18 0730  BP: 126/73 (!) 132/91  Pulse: 74 86  Resp: 12 15  Temp:    SpO2: 97% 96%     Physical Exam  Constitutional: He is oriented to person, place, and time. He appears well-developed and well-nourished. No distress.  HENT:  Head: Normocephalic and atraumatic.  Mouth/Throat: Oropharynx is clear and moist. No oropharyngeal exudate.  Eyes: EOM are normal. No scleral icterus.  Neck: Normal range of motion. Neck supple.  Cardiovascular: Normal rate and regular rhythm.  Pulmonary/Chest: Effort normal and breath sounds normal.  Abdominal: Soft. Bowel sounds are normal. He exhibits no distension. There is no tenderness. There is no rebound.  Musculoskeletal: Normal range of motion. He exhibits no edema.  Neurological: He is alert and oriented to person, place, and time.  Skin: Skin is warm. No erythema.  Psychiatric: He has a normal mood and affect. Judgment and thought content normal.  Nursing note and vitals reviewed.   GI:  Lab Results: Recent Labs    04/04/18 1955 04/04/18 1959 04/05/18 0613 04/05/18 1004  WBC 11.8*  --  5.0 4.6  HGB 8.2* 8.2* 8.6* 9.1*  HCT 26.4* 24.0* 27.2* 28.5*  PLT 202  --  154 177   BMET Recent Labs    04/04/18 1955 04/04/18 1959 04/05/18 0613  NA 135 138 137  K 3.4* 3.5 3.8  CL 103 103 109  CO2 23  --  23  GLUCOSE 123* 121* 95  BUN 15 13 14   CREATININE 1.17 1.20 1.09  CALCIUM 8.5*  --  8.2*   LFT Recent Labs    04/05/18 0613  PROT 6.0*  ALBUMIN 3.4*  AST 15  ALT 13  ALKPHOS 42  BILITOT 0.6   PT/INR No results for  input(s): LABPROT, INR in the last 72 hours.   Studies/Results: No results found.  Impression/Plan: -Rectal bleeding.  Most likely hemorrhoidal.  Patient with intermittent loose stools for 2 years.  Possibility of inflammatory bowel disease cannot be ruled out. -Acute blood loss anemia.  Recommendations ---------------------------- -Colonoscopy for tomorrow. -Continue supportive care for now.  Okay to have clear liquids diet today.  N.p.o. past midnight.  Risks (bleeding, infection, bowel perforation that could require surgery, sedation-related changes in cardiopulmonary systems), benefits (identification and possible treatment of source of symptoms, exclusion of certain causes of symptoms), and alternatives (watchful waiting, radiographic imaging studies, empiric medical treatment)  were explained to patient in detail and patient wishes to proceed.  LOS: 1 day   Otis Brace  MD, FACP 04/05/2018, 10:24 AM  Contact #  [phone]

## 2018-04-05 NOTE — ED Notes (Signed)
He remains awake, alert and in no distress.

## 2018-04-05 NOTE — ED Notes (Signed)
He arouses easily from sleep. He is comfortable and in no distress. His wife remains at his bedside. They enquire as to the arrival time of the gastroenterologist; and I apologize that I cannot give them a precise time.

## 2018-04-05 NOTE — ED Notes (Signed)
Bed: WA15 Expected date:  Expected time:  Means of arrival:  Comments: 

## 2018-04-06 ENCOUNTER — Inpatient Hospital Stay (HOSPITAL_COMMUNITY): Payer: 59 | Admitting: Certified Registered Nurse Anesthetist

## 2018-04-06 ENCOUNTER — Encounter (HOSPITAL_COMMUNITY)
Admission: EM | Disposition: A | Discharge status: Home / Self Care | Payer: Self-pay | Source: Home / Self Care | Attending: General Surgery

## 2018-04-06 ENCOUNTER — Inpatient Hospital Stay (HOSPITAL_COMMUNITY): Payer: 59

## 2018-04-06 ENCOUNTER — Encounter (HOSPITAL_COMMUNITY): Payer: Self-pay | Admitting: *Deleted

## 2018-04-06 HISTORY — PX: BIOPSY: SHX5522

## 2018-04-06 HISTORY — PX: SUBMUCOSAL INJECTION: SHX5543

## 2018-04-06 HISTORY — PX: COLONOSCOPY WITH PROPOFOL: SHX5780

## 2018-04-06 HISTORY — PX: POLYPECTOMY: SHX5525

## 2018-04-06 LAB — BPAM RBC
BLOOD PRODUCT EXPIRATION DATE: 201911272359
Blood Product Expiration Date: 201911272359
ISSUE DATE / TIME: 201911022359
ISSUE DATE / TIME: 201911022108
UNIT TYPE AND RH: 6200
UNIT TYPE AND RH: 6200

## 2018-04-06 LAB — TYPE AND SCREEN
ABO/RH(D): A POS
ANTIBODY SCREEN: NEGATIVE
UNIT DIVISION: 0
Unit division: 0

## 2018-04-06 LAB — CBC
HEMATOCRIT: 27.4 % — AB (ref 39.0–52.0)
HEMATOCRIT: 29.6 % — AB (ref 39.0–52.0)
Hemoglobin: 8.8 g/dL — ABNORMAL LOW (ref 13.0–17.0)
Hemoglobin: 9.4 g/dL — ABNORMAL LOW (ref 13.0–17.0)
MCH: 26.7 pg (ref 26.0–34.0)
MCH: 26.8 pg (ref 26.0–34.0)
MCHC: 31.8 g/dL (ref 30.0–36.0)
MCHC: 32.1 g/dL (ref 30.0–36.0)
MCV: 83.5 fL (ref 80.0–100.0)
MCV: 84.1 fL (ref 80.0–100.0)
NRBC: 0 % (ref 0.0–0.2)
NRBC: 0 % (ref 0.0–0.2)
PLATELETS: 198 10*3/uL (ref 150–400)
Platelets: 189 10*3/uL (ref 150–400)
RBC: 3.28 MIL/uL — ABNORMAL LOW (ref 4.22–5.81)
RBC: 3.52 MIL/uL — ABNORMAL LOW (ref 4.22–5.81)
RDW: 12.9 % (ref 11.5–15.5)
RDW: 13.2 % (ref 11.5–15.5)
WBC: 5.5 10*3/uL (ref 4.0–10.5)
WBC: 5.9 10*3/uL (ref 4.0–10.5)

## 2018-04-06 SURGERY — COLONOSCOPY WITH PROPOFOL
Anesthesia: Monitor Anesthesia Care

## 2018-04-06 MED ORDER — SODIUM CHLORIDE 0.9 % IV SOLN: INTRAVENOUS | Status: DC

## 2018-04-06 MED ORDER — IOHEXOL 300 MG/ML  SOLN
100.0000 mL | Freq: Once | INTRAMUSCULAR | Status: AC | PRN
  Administered 2018-04-06: 100 mL via INTRAVENOUS

## 2018-04-06 MED ORDER — SPOT INK MARKER SYRINGE KIT
PACK | SUBMUCOSAL | Status: AC
  Filled 2018-04-06: qty 5

## 2018-04-06 MED ORDER — SPOT INK MARKER SYRINGE KIT
PACK | SUBMUCOSAL | Status: DC | PRN
  Administered 2018-04-06: 4.5 mL via SUBMUCOSAL

## 2018-04-06 MED ORDER — PROPOFOL 10 MG/ML IV BOLUS
INTRAVENOUS | Status: AC
  Filled 2018-04-06: qty 40

## 2018-04-06 MED ORDER — IOHEXOL 300 MG/ML  SOLN: 15.0000 mL | Freq: Once | INTRAMUSCULAR | Status: DC | PRN

## 2018-04-06 MED ORDER — LACTATED RINGERS IV SOLN
INTRAVENOUS | Status: DC | PRN
  Administered 2018-04-06: 12:00:00 via INTRAVENOUS

## 2018-04-06 MED ORDER — PROPOFOL 10 MG/ML IV BOLUS
INTRAVENOUS | Status: DC | PRN
  Administered 2018-04-06: 30 mg via INTRAVENOUS
  Administered 2018-04-06: 20 mg via INTRAVENOUS
  Administered 2018-04-06: 30 mg via INTRAVENOUS
  Administered 2018-04-06: 50 mg via INTRAVENOUS

## 2018-04-06 MED ORDER — SODIUM CHLORIDE 0.9 % IJ SOLN
INTRAMUSCULAR | Status: AC
  Filled 2018-04-06: qty 50

## 2018-04-06 MED ORDER — ALPRAZOLAM 0.25 MG PO TABS
0.2500 mg | ORAL_TABLET | Freq: Once | ORAL | Status: AC
  Administered 2018-04-06: 0.25 mg via ORAL
  Filled 2018-04-06: qty 1

## 2018-04-06 MED ORDER — PROPOFOL 500 MG/50ML IV EMUL
INTRAVENOUS | Status: DC | PRN
  Administered 2018-04-06: 100 ug/kg/min via INTRAVENOUS

## 2018-04-06 MED ORDER — PROPOFOL 10 MG/ML IV BOLUS
INTRAVENOUS | Status: AC
  Filled 2018-04-06: qty 20

## 2018-04-06 SURGICAL SUPPLY — 22 items

## 2018-04-06 NOTE — Consult Note (Signed)
Reason for Consult:colon mass Referring Physician: Dr. Elvina Mattes Charles Rowland is an 42 y.o. male.  HPI:  The patient is a 42 year old white male who presents with rectal bleeding for the last couple weeks.  He denies any rectal pain.  He denies any nausea or vomiting.  He denies any weight loss.  He has no family history of colon cancer.  A colonoscopy was performed today that showed a mass in the upper rectum between 20 and 25 cm from the anal verge as well as a small polyp in the transverse colon.  Both of these were biopsied.  The lesion in the upper rectum is very worrisome for cancer.  It did not appear to be obstructing.  History reviewed. No pertinent past medical history.  History reviewed. No pertinent surgical history.  Family History  Problem Relation Age of Onset  . Lung cancer Other   . CAD Neg Hx   . Stroke Neg Hx   . Hypertension Neg Hx     Social History:  reports that he has never smoked. He has never used smokeless tobacco. He reports that he drinks alcohol. He reports that he does not use drugs.  Allergies: No Known Allergies  Medications: I have reviewed the patient's current medications.  Results for orders placed or performed during the hospital encounter of 04/04/18 (from the past 48 hour(s))  Reticulocytes     Status: Abnormal   Collection Time: 04/04/18  7:50 PM  Result Value Ref Range   Retic Ct Pct 2.7 0.4 - 3.1 %   RBC. 3.11 (L) 4.22 - 5.81 MIL/uL   Retic Count, Absolute 84.3 19.0 - 186.0 K/uL   Immature Retic Fract 29.3 (H) 2.3 - 15.9 %    Comment: Performed at [facility], [place] [address], [place], [place] [address]  Troponin I     Status: None   Collection Time: 04/04/18  7:50 PM  Result Value Ref Range   Troponin I <0.03 <0.03 ng/mL    Comment: Performed at [facility], [place] [address], [place], [place] [address]  ABO/Rh     Status: None   Collection Time: 04/04/18  7:52 PM  Result Value Ref Range   ABO/RH(D)      A POS Performed at [facility], [place] [address], [place], [place] [address]   Comprehensive metabolic panel     Status: Abnormal   Collection Time: 04/04/18  7:55 PM  Result Value Ref Range   Sodium 135 135 - 145 mmol/L   Potassium 3.4 (L) 3.5 - 5.1 mmol/L   Chloride 103 98 - 111 mmol/L   CO2 23 22 - 32 mmol/L   Glucose, Bld 123 (H) 70 - 99 mg/dL   BUN 15 6 - 20 mg/dL   Creatinine, Ser 1.17 0.61 - 1.24 mg/dL   Calcium 8.5 (L) 8.9 - 10.3 mg/dL   Total Protein 6.8 6.5 - 8.1 g/dL   Albumin 3.9 3.5 - 5.0 g/dL   AST 18 15 - 41 U/L   ALT 14 0 - 44 U/L   Alkaline Phosphatase 47 38 - 126 U/L   Total Bilirubin 0.4 0.3 - 1.2 mg/dL   GFR calc non Af Amer >60 >60 mL/min   GFR calc Af Amer >60 >60 mL/min    Comment: (NOTE) The eGFR has been calculated using the CKD EPI equation. This calculation has not been validated in all clinical situations. eGFR's persistently <60 mL/min signify possible Chronic Kidney Disease.  Anion gap 9 5 - 15    Comment: Performed at [facility], [place] [address] [place], [place] [address]  CBC     Status: Abnormal   Collection Time: 04/04/18  7:55 PM  Result Value Ref Range   WBC 11.8 (H) 4.0 - 10.5 K/uL   RBC 3.19 (L) 4.22 - 5.81 MIL/uL   Hemoglobin 8.2 (L) 13.0 - 17.0 g/dL   HCT 26.4 (L) 39.0 - 52.0 %   MCV 82.8 80.0 - 100.0 fL   MCH 25.7 (L) 26.0 - 34.0 pg   MCHC 31.1 30.0 - 36.0 g/dL   RDW 12.4 11.5 - 15.5 %   Platelets 202 150 - 400 K/uL   nRBC 0.0 0.0 - 0.2 %    Comment: Performed at [facility], [place] [address] [place], [place] [address]  Type and screen [place]     Status: None   Collection Time: 04/04/18  7:55 PM  Result Value Ref Range   ABO/RH(D) A POS    Antibody Screen NEG    Sample Expiration 04/07/2018    Unit Number [id number]    Blood Component Type RED CELLS,LR    Unit division 00    Status of Unit ISSUED,FINAL    Transfusion  Status OK TO TRANSFUSE    Crossmatch Result Compatible    Unit Number [id number]    Blood Component Type RED CELLS,LR    Unit division 00    Status of Unit ISSUED,FINAL    Transfusion Status OK TO TRANSFUSE    Crossmatch Result      Compatible Performed at [facility], [place] [address], [place],  [address]   HIV antibody (Routine Testing)     Status: None   Collection Time: 04/04/18  7:56 PM  Result Value Ref Range   HIV Screen 4th Generation wRfx Non Reactive Non Reactive    Comment: (NOTE) Performed At: [facility] [place], [place] [id number] Rush Farmer MD HU:[id number]   I-stat chem 8, ed     Status: Abnormal   Collection Time: 04/04/18  7:59 PM  Result Value Ref Range   Sodium 138 135 - 145 mmol/L   Potassium 3.5 3.5 - 5.1 mmol/L   Chloride 103 98 - 111 mmol/L   BUN 13 6 - 20 mg/dL   Creatinine, Ser 1.20 0.61 - 1.24 mg/dL   Glucose, Bld 121 (H) 70 - 99 mg/dL   Calcium, Ion 1.13 (L) 1.15 - 1.40 mmol/L   TCO2 23 22 - 32 mmol/L   Hemoglobin 8.2 (L) 13.0 - 17.0 g/dL   HCT 24.0 (L) 39.0 - 52.0 %  Prepare RBC     Status: None   Collection Time: 04/04/18  8:08 PM  Result Value Ref Range   Order Confirmation      ORDER PROCESSED BY BLOOD BANK Performed at [facility], [place] [address] [place], [place] [address]   Troponin I (q 6hr x 3)     Status: None   Collection Time: 04/05/18 12:08 AM  Result Value Ref Range   Troponin I <0.03 <0.03 ng/mL    Comment: Performed at [facility], [place] [address], [place], [place] [address]  Troponin I (q 6hr x 3)     Status: None   Collection Time: 04/05/18  6:13 AM  Result Value Ref Range   Troponin I <0.03 <0.03 ng/mL    Comment: Performed at [facility], [place] [address] [place], [place] [address]  Magnesium     Status: None   Collection Time: 04/05/18  6:13 AM  Result Value Ref Range   Magnesium 2.0 1.7 - 2.4 mg/dL     Comment: Performed at [facility], [place] [address], [place], [place] [address]  Phosphorus     Status: None   Collection Time: 04/05/18  6:13 AM  Result Value Ref Range   Phosphorus 2.8 2.5 - 4.6 mg/dL    Comment: Performed at [facility], [place] [address], [place], [place] [address]  TSH     Status: None   Collection Time: 04/05/18  6:13 AM  Result Value Ref Range   TSH 3.129 0.350 - 4.500 uIU/mL    Comment: Performed by a 3rd Generation assay with a functional sensitivity of <=0.01 uIU/mL. Performed at [facility], [place] [address] [place], [place] [address]   Comprehensive metabolic panel     Status: Abnormal   Collection Time: 04/05/18  6:13 AM  Result Value Ref Range   Sodium 137 135 - 145 mmol/L   Potassium 3.8 3.5 - 5.1 mmol/L   Chloride 109 98 - 111 mmol/L   CO2 23 22 - 32 mmol/L   Glucose, Bld 95 70 - 99 mg/dL   BUN 14 6 - 20 mg/dL   Creatinine, Ser 1.09 0.61 - 1.24 mg/dL   Calcium 8.2 (L) 8.9 - 10.3 mg/dL   Total Protein 6.0 (L) 6.5 - 8.1 g/dL   Albumin 3.4 (L) 3.5 - 5.0 g/dL   AST 15 15 - 41 U/L   ALT 13 0 - 44 U/L   Alkaline Phosphatase 42 38 - 126 U/L   Total Bilirubin 0.6 0.3 - 1.2 mg/dL   GFR calc non Af Amer >60 >60 mL/min   GFR calc Af Amer >60 >60 mL/min    Comment: (NOTE) The eGFR has been calculated using the CKD EPI equation. This calculation has not been validated in all clinical situations. eGFR's persistently <60 mL/min signify possible Chronic Kidney Disease.    Anion gap 5 5 - 15    Comment: Performed at [facility], [place] [address] [place], [place] [address]  CBC     Status: Abnormal   Collection Time: 04/05/18  6:13 AM  Result Value Ref Range   WBC 5.0 4.0 - 10.5 K/uL   RBC 3.21 (L) 4.22 - 5.81 MIL/uL   Hemoglobin 8.6 (L) 13.0 - 17.0 g/dL   HCT 27.2 (L) 39.0 - 52.0 %   MCV 84.7 80.0 - 100.0 fL   MCH 26.8 26.0 - 34.0 pg   MCHC 31.6 30.0 - 36.0 g/dL   RDW 13.0 11.5 -  15.5 %   Platelets 154 150 - 400 K/uL   nRBC 0.0 0.0 - 0.2 %    Comment: Performed at [facility], [place] [address], [place], [place] [address]  CBC     Status: Abnormal   Collection Time: 04/05/18 10:04 AM  Result Value Ref Range   WBC 4.6 4.0 - 10.5 K/uL   RBC 3.37 (L) 4.22 - 5.81 MIL/uL   Hemoglobin 9.1 (L) 13.0 - 17.0 g/dL   HCT 28.5 (L) 39.0 - 52.0 %   MCV 84.6 80.0 - 100.0 fL   MCH 27.0 26.0 - 34.0 pg   MCHC 31.9 30.0 - 36.0 g/dL   RDW 13.0 11.5 - 15.5 %   Platelets 177 150 - 400 K/uL  nRBC 0.0 0.0 - 0.2 %    Comment: Performed at [facility], [place] [address] [place], [place] [address]  Troponin I (q 6hr x 3)     Status: None   Collection Time: 04/05/18 12:00 PM  Result Value Ref Range   Troponin I <0.03 <0.03 ng/mL    Comment: Performed at [facility], [place] [address] [place], [place] [address]  Ferritin     Status: Abnormal   Collection Time: 04/05/18 12:01 PM  Result Value Ref Range   Ferritin 6 (L) 24 - 336 ng/mL    Comment: Performed at [facility], [place] [address] [place], [place] [address]  Folate     Status: None   Collection Time: 04/05/18 12:01 PM  Result Value Ref Range   Folate 15.1 >5.9 ng/mL    Comment: Performed at [facility], [place] [address], [place], [place] [address]  Iron and TIBC     Status: Abnormal   Collection Time: 04/05/18 12:01 PM  Result Value Ref Range   Iron 21 (L) 45 - 182 ug/dL   TIBC 367 250 - 450 ug/dL   Saturation Ratios 6 (L) 17.9 - 39.5 %   UIBC 346 ug/dL    Comment: Performed at [facility], [place] [address], [place], [place] [address]  Vitamin B12     Status: None   Collection Time: 04/05/18 12:01 PM  Result Value Ref Range   Vitamin B-12 225 180 - 914 pg/mL    Comment: (NOTE) This assay is not validated for testing neonatal or myeloproliferative syndrome specimens for Vitamin B12 levels. Performed at [facility], [place] [address] [place], [place] [address]   CBC     Status: Abnormal   Collection Time: 04/05/18  4:35 PM  Result Value Ref Range   WBC 5.2 4.0 - 10.5 K/uL   RBC 3.46 (L) 4.22 - 5.81 MIL/uL   Hemoglobin 9.2 (L) 13.0 - 17.0 g/dL   HCT 29.1 (L) 39.0 - 52.0 %   MCV 84.1 80.0 - 100.0 fL   MCH 26.6 26.0 - 34.0 pg   MCHC 31.6 30.0 - 36.0 g/dL   RDW 13.0 11.5 - 15.5 %   Platelets 197 150 - 400 K/uL   nRBC 0.0 0.0 - 0.2 %    Comment: Performed at [facility], [place] [address] [place], [place] [address]  CBC     Status: Abnormal   Collection Time: 04/05/18  6:19 PM  Result Value Ref Range   WBC 5.4 4.0 - 10.5 K/uL   RBC 3.48 (L) 4.22 - 5.81 MIL/uL   Hemoglobin 9.4 (L) 13.0 - 17.0 g/dL   HCT 29.5 (L) 39.0 - 52.0 %   MCV 84.8 80.0 - 100.0 fL   MCH 27.0 26.0 - 34.0 pg   MCHC 31.9 30.0 - 36.0 g/dL   RDW 13.2 11.5 - 15.5 %   Platelets 189 150 - 400 K/uL   nRBC 0.0 0.0 - 0.2 %    Comment: Performed at [facility], [place] [address], [place], [place] [address]  CBC     Status: Abnormal   Collection Time: 04/06/18 12:45 AM  Result Value Ref Range   WBC 5.9 4.0 - 10.5 K/uL   RBC 3.28 (L) 4.22 - 5.81 MIL/uL   Hemoglobin 8.8 (L) 13.0 - 17.0 g/dL   HCT 27.4 (L) 39.0 - 52.0 %   MCV 83.5 80.0 -  100.0 fL   MCH 26.8 26.0 - 34.0 pg   MCHC 32.1 30.0 - 36.0 g/dL   RDW 12.9 11.5 - 15.5 %   Platelets 189 150 - 400 K/uL   nRBC 0.0 0.0 - 0.2 %    Comment: Performed at [facility], [place] [address] [place], [place] [address]  CBC     Status: Abnormal   Collection Time: 04/06/18 10:07 AM  Result Value Ref Range   WBC 5.5 4.0 - 10.5 K/uL   RBC 3.52 (L) 4.22 - 5.81 MIL/uL   Hemoglobin 9.4 (L) 13.0 - 17.0 g/dL   HCT 29.6 (L) 39.0 - 52.0 %   MCV 84.1 80.0 - 100.0 fL   MCH 26.7 26.0 - 34.0 pg   MCHC 31.8 30.0 - 36.0 g/dL   RDW 13.2 11.5 - 15.5 %   Platelets 198 150 - 400 K/uL   nRBC 0.0 0.0 - 0.2 %    Comment:  Performed at [facility], [place] [address], [place], [place] [address]    No results found.  Review of Systems  Constitutional: Negative.   HENT: Negative.   Eyes: Negative.   Respiratory: Negative.   Cardiovascular: Negative.   Gastrointestinal: Positive for blood in stool.  Genitourinary: Negative.   Musculoskeletal: Positive for back pain.  Skin: Negative.   Neurological: Positive for dizziness.  Endo/Heme/Allergies: Negative.   Psychiatric/Behavioral: Negative.    Blood pressure (!) 139/99, pulse 90, temperature 97.8 F (36.6 C), temperature source Oral, resp. rate 20, SpO2 99 %. Physical Exam  Constitutional: He is oriented to person, place, and time. He appears well-developed and well-nourished. No distress.  HENT:  Head: Normocephalic and atraumatic.  Mouth/Throat: No oropharyngeal exudate.  Eyes: Pupils are equal, round, and reactive to light. Conjunctivae and EOM are normal.  Neck: Normal range of motion. Neck supple.  Cardiovascular: Normal rate, regular rhythm and normal heart sounds.  Respiratory: Effort normal and breath sounds normal. No stridor. No respiratory distress.  GI: Soft. Bowel sounds are normal. There is no tenderness.  Musculoskeletal: Normal range of motion. He exhibits no edema or tenderness.  Neurological: He is alert and oriented to person, place, and time. Coordination normal.  Skin: Skin is warm and dry. No rash noted.  Psychiatric: He has a normal mood and affect. His behavior is normal. Thought content normal.    Assessment/Plan:  the patient appears to have a moderate sized mass in the upper rectum that has an appearance that is worrisome for colon cancer.  It does not appear to be obstructing.  At this point we will complete his workup with a CEA, a CT scan of the abdomen and pelvis.  We would like to know the results of the transverse colon lesion as well prior to operating.  I suspect that he will require a low anterior  resection to remove the lesion in the upper rectum.  I have discussed with him in detail the risks and benefits of the operation as well as some of the technical aspects including the risk of leak and he understands and wishes to proceed.  We will obtain the results of these preoperative studies and then begin surgical planning for later this week  Autumn Messing III 04/06/2018, 3:33 PM

## 2018-04-06 NOTE — Progress Notes (Signed)
PT Cancellation Note  Patient Details Name: Charles Rowland MRN: [id number] DOB: 10-29-1975   Cancelled Treatment:    Reason Eval/Treat Not Completed: PT screened, no needs identified, will sign off. Spoke with pt who denied need for PT services at this time. Will sign off at pt's request. Please reorder if mobility needs change. Thanks.    Weston Anna, PT Acute Rehabilitation Services Pager: [phone] Office: [phone]

## 2018-04-06 NOTE — Progress Notes (Signed)
PROGRESS NOTE    Charles Rowland  MOQ:[id number] DOB: [birth year] DOA: 04/04/2018 PCP: Dineen Kid, MD     Brief Narrative:  42 year old man without past medical history of significance admitted from home on 11/2 due to rectal bleeding and a syncopal episode while on the commode.  His hemoglobin had dropped from 11-8.2 in the space of 3 days.  Admission was requested for further evaluation and management.   Assessment & Plan:   Active Problems:   Bright red blood per rectum   Lower GI bleed   Rectal bleed   Rectal bleeding -Etiology remains unclear. -No family history of colon cancer or inflammatory bowel disease. -He does have a history of internal hemorrhoids. -His ferritin is 6 which points to iron deficiency anemia presumably from chronic blood loss. -Colonoscopy has been planned for today. -Was transfused 2 units of PRBCs on 11/3. -Received a dose of IV Feraheme on 11/3.  Should be on iron supplementation upon discharge. -Further recommendations after colonoscopy.   DVT prophylaxis: SCDs Code Status: Full code Family Communication: Wife at bedside updated on plan of care and all questions answered Disposition Plan: Colonoscopy today, further recommendations to follow  Consultants:   GI  Procedures:   Colonoscopy scheduled for 11/4.  Antimicrobials:  Anti-infectives (From admission, onward)   None       Subjective: Lying in bed, no complaints, only noticed blood with his first bowel movement with colonoscopy prep, subsequent bowel movements were brown and then watery.  Denies abdominal pain.  Objective: Vitals:   04/05/18 2341 04/05/18 2342 04/06/18 0434 04/06/18 0807  BP: (!) 164/122 (!) 159/103 (!) 155/101 (!) 152/106  Pulse: (!) 113 98 89 91  Resp:   16   Temp:   98.7 F (37.1 C)   TempSrc:   Oral   SpO2:   96%     Intake/Output Summary (Last 24 hours) at 04/06/2018 1035 Last data filed at 04/06/2018 1000 Gross per 24 hour  Intake 2008.04 ml    Output 0 ml  Net 2008.04 ml   There were no vitals filed for this visit.  Examination:  General exam: Alert, awake, oriented x 3 Respiratory system: Clear to auscultation. Respiratory effort normal. Cardiovascular system:RRR. No murmurs, rubs, gallops. Gastrointestinal system: Abdomen is nondistended, soft and nontender. No organomegaly or masses felt. Normal bowel sounds heard. Central nervous system: Alert and oriented. No focal neurological deficits. Extremities: No C/C/E, +pedal pulses Skin: No rashes, lesions or ulcers Psychiatry: Judgement and insight appear normal. Mood & affect appropriate.      Data Reviewed: I have personally reviewed following labs and imaging studies  CBC: Recent Labs  Lab 04/05/18 1004 04/05/18 1635 04/05/18 1819 04/06/18 0045 04/06/18 1007  WBC 4.6 5.2 5.4 5.9 5.5  HGB 9.1* 9.2* 9.4* 8.8* 9.4*  HCT 28.5* 29.1* 29.5* 27.4* 29.6*  MCV 84.6 84.1 84.8 83.5 84.1  PLT 177 197 189 189 354   Basic Metabolic Panel: Recent Labs  Lab 04/04/18 1955 04/04/18 1959 04/05/18 0613  NA 135 138 137  K 3.4* 3.5 3.8  CL 103 103 109  CO2 23  --  23  GLUCOSE 123* 121* 95  BUN 15 13 14   CREATININE 1.17 1.20 1.09  CALCIUM 8.5*  --  8.2*  MG  --   --  2.0  PHOS  --   --  2.8   GFR: CrCl cannot be calculated (Unknown ideal weight.). Liver Function Tests: Recent Labs  Lab 04/04/18 1955 04/05/18 0613  AST  18 15  ALT 14 13  ALKPHOS 47 42  BILITOT 0.4 0.6  PROT 6.8 6.0*  ALBUMIN 3.9 3.4*   No results for input(s): LIPASE, AMYLASE in the last 168 hours. No results for input(s): AMMONIA in the last 168 hours. Coagulation Profile: No results for input(s): INR, PROTIME in the last 168 hours. Cardiac Enzymes: Recent Labs  Lab 04/04/18 1950 04/05/18 0008 04/05/18 0613 04/05/18 1200  TROPONINI <0.03 <0.03 <0.03 <0.03   BNP (last 3 results) No results for input(s): PROBNP in the last 8760 hours. HbA1C: No results for input(s): HGBA1C in the  last 72 hours. CBG: No results for input(s): GLUCAP in the last 168 hours. Lipid Profile: No results for input(s): CHOL, HDL, LDLCALC, TRIG, CHOLHDL, LDLDIRECT in the last 72 hours. Thyroid Function Tests: Recent Labs    04/05/18 0613  TSH 3.129   Anemia Panel: Recent Labs    04/04/18 1950 04/05/18 1201  VITAMINB12  --  225  FOLATE  --  15.1  FERRITIN  --  6*  TIBC  --  367  IRON  --  21*  RETICCTPCT 2.7  --    Urine analysis: No results found for: COLORURINE, APPEARANCEUR, LABSPEC, PHURINE, GLUCOSEU, HGBUR, BILIRUBINUR, KETONESUR, PROTEINUR, UROBILINOGEN, NITRITE, LEUKOCYTESUR Sepsis Labs: @LABRCNTIP (procalcitonin:4,lacticidven:4)  )No results found for this or any previous visit (from the past 240 hour(s)).       Radiology Studies: No results found.      Scheduled Meds: . sodium chloride   Intravenous Once   Continuous Infusions: . sodium chloride 75 mL/hr at 04/06/18 0637     LOS: 2 days    Time spent: 25 minutes.     Lelon Frohlich, MD [facility] Pager [phone]  If 7PM-7AM, please contact night-coverage [email or web address] Password [email or web address] 04/06/2018, 10:35 AM

## 2018-04-06 NOTE — Op Note (Signed)
[facility] Patient Name: Charles Rowland Procedure Date: 04/06/2018 MRN: [id number] Attending MD: Ronnette Juniper , MD Date of Birth: 17-Nov-1975 CSN: [id number] Age: 42 Admit Type: Inpatient Procedure:                Colonoscopy Indications:              This is the patient's first colonoscopy, Rectal                            bleeding Providers:                Ronnette Juniper, MD, Elmer Ramp. Tilden Dome, RN, William Dalton, Technician Referring MD:              Medicines:                Monitored Anesthesia Care Complications:            No immediate complications. Estimated blood loss:                            Minimal. Estimated Blood Loss:     Estimated blood loss was minimal. Procedure:                Pre-Anesthesia Assessment:                           - Prior to the procedure, a History and Physical                            was performed, and patient medications and                            allergies were reviewed. The patient's tolerance of                            previous anesthesia was also reviewed. The risks                            and benefits of the procedure and the sedation                            options and risks were discussed with the patient.                            All questions were answered, and informed consent                            was obtained. Prior Anticoagulants: The patient has                            taken no previous anticoagulant or antiplatelet                            agents. ASA Grade Assessment: I - A  normal, healthy                            patient. After reviewing the risks and benefits,                            the patient was deemed in satisfactory condition to                            undergo the procedure.                           After obtaining informed consent, the colonoscope                            was passed under direct vision. Throughout the    procedure, the patient's blood pressure, pulse, and                            oxygen saturations were monitored continuously. The                            PCF-H190DL ([id number] Olympus peds colonoscope was                            introduced through the anus and advanced to the the                            terminal ileum. The colonoscopy was performed                            without difficulty. The patient tolerated the                            procedure well. The quality of the bowel                            preparation was adequate to identify polyps 6 mm                            and larger in size. Findings:      The perianal and digital rectal examinations were normal.      Non-bleeding internal hemorrhoids were found during retroflexion. The       hemorrhoids were small.      A frond-like/villous, infiltrative and polypoid partially obstructing       large mass was found from 20 to 25 cm proximal to the anus. The mass was       partially circumferential (involving two-thirds of the lumen       circumference). The mass measured five cm in length. No bleeding was       present. This was biopsied with a hot snare for histology. Area was       tattooed with an injection of 5 mL of Niger ink at the proximal and       distal margins.      A 6 mm polyp  was found in the transverse colon. The polyp was sessile.       The polyp was removed with a hot snare. Resection and retrieval were       complete.      The terminal ileum appeared normal. Impression:               - Non-bleeding internal hemorrhoids.                           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 Biopsied. Tattooed.                           - One 6 mm polyp in the transverse colon, removed                            with a hot snare. Resected and retrieved.                           - The examined portion of the ileum was normal. Moderate  Sedation:      Patient did not receive moderate sedation for this procedure, but       instead received monitored anesthesia care. Recommendation:           - Await pathology results.                           - Refer to a surgeon today.                           - Perform a CT scan (computed tomography) of chest                            with contrast, abdomen with contrast and pelvis                            with contrast at appointment to be scheduled.                           - CEA level.                           - Repeat colonoscopy in 1 year post surgery,                            depeding on clinical course. Procedure Code(s):        --- Professional ---                           [phone], Colonoscopy, flexible; with removal of                            tumor(s), polyp(s), or other lesion(s) by snare  technique                           [id number], Colonoscopy, flexible; with directed                            submucosal injection(s), any substance Diagnosis Code(s):        --- Professional ---                           C18.8, Malignant neoplasm of overlapping sites of                            colon                           K56.690, Other partial intestinal obstruction                           D12.3, Benign neoplasm of transverse colon (hepatic                            flexure or splenic flexure)                           K64.8, Other hemorrhoids                           K62.5, Hemorrhage of anus and rectum CPT copyright 2018 [employer]. All rights reserved. The codes documented in this report are preliminary and upon coder review may  be revised to meet current compliance requirements. Ronnette Juniper, MD 04/06/2018 12:43:29 PM This report has been signed electronically. Number of Addenda: 0

## 2018-04-06 NOTE — Brief Op Note (Signed)
04/04/2018 - 04/06/2018  12:43 PM  PATIENT:  Carloyn Manner  42 y.o. male  PRE-OPERATIVE DIAGNOSIS:  Rectal bleeding  POST-OPERATIVE DIAGNOSIS:  transverse polyp, colon mass at 20-25cm  PROCEDURE:  Procedure(s): COLONOSCOPY WITH PROPOFOL (N/A) POLYPECTOMY BIOPSY SCLEROTHERAPY  SURGEON:  Surgeon(s) and Role:    Ronnette Juniper, MD - Primary  PHYSICIAN ASSISTANT:   ASSISTANTS:Karen Hinson, RN,Guillaume  Leighton Roach , Tech  ANESTHESIA:   MAC  EBL:  minimal   BLOOD ADMINISTERED:none  DRAINS: none   LOCAL MEDICATIONS USED:  NONE  SPECIMEN:  Biopsy / Limited Resection  DISPOSITION OF SPECIMEN:  PATHOLOGY  COUNTS:  YES  TOURNIQUET:  * No tourniquets in log *  DICTATION: .Dragon Dictation  PLAN OF CARE: Admit to inpatient   PATIENT DISPOSITION:  PACU - hemodynamically stable.   Delay start of Pharmacological VTE agent (>24hrs) due to surgical blood loss or risk of bleeding: no

## 2018-04-06 NOTE — Anesthesia Postprocedure Evaluation (Signed)
Anesthesia Post Note  Patient: Charles Rowland  Procedure(s) Performed: COLONOSCOPY WITH PROPOFOL (N/A ) POLYPECTOMY BIOPSY SCLEROTHERAPY     Patient location during evaluation: Endoscopy Anesthesia Type: MAC Level of consciousness: awake and alert Pain management: pain level controlled Vital Signs Assessment: post-procedure vital signs reviewed and stable Respiratory status: spontaneous breathing, nonlabored ventilation, respiratory function stable and patient connected to nasal cannula oxygen Cardiovascular status: stable and blood pressure returned to baseline Postop Assessment: no apparent nausea or vomiting Anesthetic complications: no Comments: No antiemetics given due to MAC procedure, and no patient complaint of nausea/vomiting.     Last Vitals:  Vitals:   04/06/18 1241 04/06/18 1250  BP: 117/79 132/69  Pulse: 95 83  Resp: 12 18  Temp: 36.6 C   SpO2: 100% 100%    Last Pain:  Vitals:   04/06/18 1250  TempSrc:   PainSc: 0-No pain                 Catalina Gravel

## 2018-04-06 NOTE — Transfer of Care (Signed)
Immediate Anesthesia Transfer of Care Note  Patient: Charles Rowland  Procedure(s) Performed: COLONOSCOPY WITH PROPOFOL (N/A ) POLYPECTOMY BIOPSY SCLEROTHERAPY  Patient Location: Endoscopy Unit  Anesthesia Type:MAC  Level of Consciousness: drowsy and patient cooperative  Airway & Oxygen Therapy: Patient Spontanous Breathing and Patient connected to face mask oxygen  Post-op Assessment: Report given to RN and Post -op Vital signs reviewed and stable  Post vital signs: Reviewed and stable  Last Vitals:  Vitals Value Taken Time  BP 117/79 04/06/2018 12:42 PM  Temp    Pulse 92 04/06/2018 12:43 PM  Resp 17 04/06/2018 12:43 PM  SpO2 100 % 04/06/2018 12:43 PM  Vitals shown include unvalidated device data.  Last Pain:  Vitals:   04/06/18 1126  TempSrc: Oral  PainSc: 0-No pain         Complications: No apparent anesthesia complications

## 2018-04-06 NOTE — H&P (View-Only) (Signed)
Reason for Consult:colon mass Referring Physician: Dr. Elvina Mattes Fassnacht is an 42 y.o. male.  HPI:  The patient is a 42 year old white male who presents with rectal bleeding for the last couple weeks.  He denies any rectal pain.  He denies any nausea or vomiting.  He denies any weight loss.  He has no family history of colon cancer.  A colonoscopy was performed today that showed a mass in the upper rectum between 20 and 25 cm from the anal verge as well as a small polyp in the transverse colon.  Both of these were biopsied.  The lesion in the upper rectum is very worrisome for cancer.  It did not appear to be obstructing.  History reviewed. No pertinent past medical history.  History reviewed. No pertinent surgical history.  Family History  Problem Relation Age of Onset  . Lung cancer Other   . CAD Neg Hx   . Stroke Neg Hx   . Hypertension Neg Hx     Social History:  reports that he has never smoked. He has never used smokeless tobacco. He reports that he drinks alcohol. He reports that he does not use drugs.  Allergies: No Known Allergies  Medications: I have reviewed the patient's current medications.  Results for orders placed or performed during the hospital encounter of 04/04/18 (from the past 48 hour(s))  Reticulocytes     Status: Abnormal   Collection Time: 04/04/18  7:50 PM  Result Value Ref Range   Retic Ct Pct 2.7 0.4 - 3.1 %   RBC. 3.11 (L) 4.22 - 5.81 MIL/uL   Retic Count, Absolute 84.3 19.0 - 186.0 K/uL   Immature Retic Fract 29.3 (H) 2.3 - 15.9 %    Comment: Performed at [facility], [place] [address], [place], [place] [address]  Troponin I     Status: None   Collection Time: 04/04/18  7:50 PM  Result Value Ref Range   Troponin I <0.03 <0.03 ng/mL    Comment: Performed at [facility], [place] [address], [place], [place] [address]  ABO/Rh     Status: None   Collection Time: 04/04/18  7:52 PM  Result Value Ref Range   ABO/RH(D)      A POS Performed at [facility], [place] [address], [place], [place] [address]   Comprehensive metabolic panel     Status: Abnormal   Collection Time: 04/04/18  7:55 PM  Result Value Ref Range   Sodium 135 135 - 145 mmol/L   Potassium 3.4 (L) 3.5 - 5.1 mmol/L   Chloride 103 98 - 111 mmol/L   CO2 23 22 - 32 mmol/L   Glucose, Bld 123 (H) 70 - 99 mg/dL   BUN 15 6 - 20 mg/dL   Creatinine, Ser 1.17 0.61 - 1.24 mg/dL   Calcium 8.5 (L) 8.9 - 10.3 mg/dL   Total Protein 6.8 6.5 - 8.1 g/dL   Albumin 3.9 3.5 - 5.0 g/dL   AST 18 15 - 41 U/L   ALT 14 0 - 44 U/L   Alkaline Phosphatase 47 38 - 126 U/L   Total Bilirubin 0.4 0.3 - 1.2 mg/dL   GFR calc non Af Amer >60 >60 mL/min   GFR calc Af Amer >60 >60 mL/min    Comment: (NOTE) The eGFR has been calculated using the CKD EPI equation. This calculation has not been validated in all clinical situations. eGFR's persistently <60 mL/min signify possible Chronic Kidney Disease.  Anion gap 9 5 - 15    Comment: Performed at [facility], [place] [address], [place], [place] [address]  CBC     Status: Abnormal   Collection Time: 04/04/18  7:55 PM  Result Value Ref Range   WBC 11.8 (H) 4.0 - 10.5 K/uL   RBC 3.19 (L) 4.22 - 5.81 MIL/uL   Hemoglobin 8.2 (L) 13.0 - 17.0 g/dL   HCT 26.4 (L) 39.0 - 52.0 %   MCV 82.8 80.0 - 100.0 fL   MCH 25.7 (L) 26.0 - 34.0 pg   MCHC 31.1 30.0 - 36.0 g/dL   RDW 12.4 11.5 - 15.5 %   Platelets 202 150 - 400 K/uL   nRBC 0.0 0.0 - 0.2 %    Comment: Performed at [facility], [place] [address], [place], [place] [address]  Type and screen [place]     Status: None   Collection Time: 04/04/18  7:55 PM  Result Value Ref Range   ABO/RH(D) A POS    Antibody Screen NEG    Sample Expiration 04/07/2018    Unit Number [id number]    Blood Component Type RED CELLS,LR    Unit division 00    Status of Unit ISSUED,FINAL    Transfusion  Status OK TO TRANSFUSE    Crossmatch Result Compatible    Unit Number [id number]    Blood Component Type RED CELLS,LR    Unit division 00    Status of Unit ISSUED,FINAL    Transfusion Status OK TO TRANSFUSE    Crossmatch Result      Compatible Performed at [facility], [place] [address], [place], [place] [address]   HIV antibody (Routine Testing)     Status: None   Collection Time: 04/04/18  7:56 PM  Result Value Ref Range   HIV Screen 4th Generation wRfx Non Reactive Non Reactive    Comment: (NOTE) Performed At: [facility] [place], [place] [id number] Rush Farmer MD OV:[id number]   I-stat chem 8, ed     Status: Abnormal   Collection Time: 04/04/18  7:59 PM  Result Value Ref Range   Sodium 138 135 - 145 mmol/L   Potassium 3.5 3.5 - 5.1 mmol/L   Chloride 103 98 - 111 mmol/L   BUN 13 6 - 20 mg/dL   Creatinine, Ser 1.20 0.61 - 1.24 mg/dL   Glucose, Bld 121 (H) 70 - 99 mg/dL   Calcium, Ion 1.13 (L) 1.15 - 1.40 mmol/L   TCO2 23 22 - 32 mmol/L   Hemoglobin 8.2 (L) 13.0 - 17.0 g/dL   HCT 24.0 (L) 39.0 - 52.0 %  Prepare RBC     Status: None   Collection Time: 04/04/18  8:08 PM  Result Value Ref Range   Order Confirmation      ORDER PROCESSED BY BLOOD BANK Performed at [facility], [place] [address] [place], [place] [address]   Troponin I (q 6hr x 3)     Status: None   Collection Time: 04/05/18 12:08 AM  Result Value Ref Range   Troponin I <0.03 <0.03 ng/mL    Comment: Performed at [facility], [place] [address], [place], [place] [address]  Troponin I (q 6hr x 3)     Status: None   Collection Time: 04/05/18  6:13 AM  Result Value Ref Range   Troponin I <0.03 <0.03 ng/mL    Comment: Performed at [facility], [place] [address], [place], [place] [address]  Magnesium     Status: None   Collection Time: 04/05/18  6:13 AM  Result Value Ref Range   Magnesium 2.0 1.7 - 2.4 mg/dL     Comment: Performed at [facility], [place] [address], [place], [place] [address]  Phosphorus     Status: None   Collection Time: 04/05/18  6:13 AM  Result Value Ref Range   Phosphorus 2.8 2.5 - 4.6 mg/dL    Comment: Performed at [facility], [place] [address] [place], [place] [address]  TSH     Status: None   Collection Time: 04/05/18  6:13 AM  Result Value Ref Range   TSH 3.129 0.350 - 4.500 uIU/mL    Comment: Performed by a 3rd Generation assay with a functional sensitivity of <=0.01 uIU/mL. Performed at [facility], [place] [address], [place], [place] [address]   Comprehensive metabolic panel     Status: Abnormal   Collection Time: 04/05/18  6:13 AM  Result Value Ref Range   Sodium 137 135 - 145 mmol/L   Potassium 3.8 3.5 - 5.1 mmol/L   Chloride 109 98 - 111 mmol/L   CO2 23 22 - 32 mmol/L   Glucose, Bld 95 70 - 99 mg/dL   BUN 14 6 - 20 mg/dL   Creatinine, Ser 1.09 0.61 - 1.24 mg/dL   Calcium 8.2 (L) 8.9 - 10.3 mg/dL   Total Protein 6.0 (L) 6.5 - 8.1 g/dL   Albumin 3.4 (L) 3.5 - 5.0 g/dL   AST 15 15 - 41 U/L   ALT 13 0 - 44 U/L   Alkaline Phosphatase 42 38 - 126 U/L   Total Bilirubin 0.6 0.3 - 1.2 mg/dL   GFR calc non Af Amer >60 >60 mL/min   GFR calc Af Amer >60 >60 mL/min    Comment: (NOTE) The eGFR has been calculated using the CKD EPI equation. This calculation has not been validated in all clinical situations. eGFR's persistently <60 mL/min signify possible Chronic Kidney Disease.    Anion gap 5 5 - 15    Comment: Performed at [facility], [place] [address], [place], [place] [address]  CBC     Status: Abnormal   Collection Time: 04/05/18  6:13 AM  Result Value Ref Range   WBC 5.0 4.0 - 10.5 K/uL   RBC 3.21 (L) 4.22 - 5.81 MIL/uL   Hemoglobin 8.6 (L) 13.0 - 17.0 g/dL   HCT 27.2 (L) 39.0 - 52.0 %   MCV 84.7 80.0 - 100.0 fL   MCH 26.8 26.0 - 34.0 pg   MCHC 31.6 30.0 - 36.0 g/dL   RDW 13.0 11.5 -  15.5 %   Platelets 154 150 - 400 K/uL   nRBC 0.0 0.0 - 0.2 %    Comment: Performed at [facility], [place] [address] [place], [place] [address]  CBC     Status: Abnormal   Collection Time: 04/05/18 10:04 AM  Result Value Ref Range   WBC 4.6 4.0 - 10.5 K/uL   RBC 3.37 (L) 4.22 - 5.81 MIL/uL   Hemoglobin 9.1 (L) 13.0 - 17.0 g/dL   HCT 28.5 (L) 39.0 - 52.0 %   MCV 84.6 80.0 - 100.0 fL   MCH 27.0 26.0 - 34.0 pg   MCHC 31.9 30.0 - 36.0 g/dL   RDW 13.0 11.5 - 15.5 %   Platelets 177 150 - 400 K/uL  nRBC 0.0 0.0 - 0.2 %    Comment: Performed at [facility], [place] [address], [place], [place] [address]  Troponin I (q 6hr x 3)     Status: None   Collection Time: 04/05/18 12:00 PM  Result Value Ref Range   Troponin I <0.03 <0.03 ng/mL    Comment: Performed at [facility], [place] [address], [place], [place] [address]  Ferritin     Status: Abnormal   Collection Time: 04/05/18 12:01 PM  Result Value Ref Range   Ferritin 6 (L) 24 - 336 ng/mL    Comment: Performed at [facility], [place] [address], [place], [place] [address]  Folate     Status: None   Collection Time: 04/05/18 12:01 PM  Result Value Ref Range   Folate 15.1 >5.9 ng/mL    Comment: Performed at [facility], [place] [address] [place], [place] [address]  Iron and TIBC     Status: Abnormal   Collection Time: 04/05/18 12:01 PM  Result Value Ref Range   Iron 21 (L) 45 - 182 ug/dL   TIBC 367 250 - 450 ug/dL   Saturation Ratios 6 (L) 17.9 - 39.5 %   UIBC 346 ug/dL    Comment: Performed at [facility], [place] [address], [place], [place] [address]  Vitamin B12     Status: None   Collection Time: 04/05/18 12:01 PM  Result Value Ref Range   Vitamin B-12 225 180 - 914 pg/mL    Comment: (NOTE) This assay is not validated for testing neonatal or myeloproliferative syndrome specimens for Vitamin B12 levels. Performed at [facility], [place] [address] [place], [place] [address]   CBC     Status: Abnormal   Collection Time: 04/05/18  4:35 PM  Result Value Ref Range   WBC 5.2 4.0 - 10.5 K/uL   RBC 3.46 (L) 4.22 - 5.81 MIL/uL   Hemoglobin 9.2 (L) 13.0 - 17.0 g/dL   HCT 29.1 (L) 39.0 - 52.0 %   MCV 84.1 80.0 - 100.0 fL   MCH 26.6 26.0 - 34.0 pg   MCHC 31.6 30.0 - 36.0 g/dL   RDW 13.0 11.5 - 15.5 %   Platelets 197 150 - 400 K/uL   nRBC 0.0 0.0 - 0.2 %    Comment: Performed at [facility], [place] [address] [place], [place] [address]  CBC     Status: Abnormal   Collection Time: 04/05/18  6:19 PM  Result Value Ref Range   WBC 5.4 4.0 - 10.5 K/uL   RBC 3.48 (L) 4.22 - 5.81 MIL/uL   Hemoglobin 9.4 (L) 13.0 - 17.0 g/dL   HCT 29.5 (L) 39.0 - 52.0 %   MCV 84.8 80.0 - 100.0 fL   MCH 27.0 26.0 - 34.0 pg   MCHC 31.9 30.0 - 36.0 g/dL   RDW 13.2 11.5 - 15.5 %   Platelets 189 150 - 400 K/uL   nRBC 0.0 0.0 - 0.2 %    Comment: Performed at [facility], [place] [address], [place], [place] [address]  CBC     Status: Abnormal   Collection Time: 04/06/18 12:45 AM  Result Value Ref Range   WBC 5.9 4.0 - 10.5 K/uL   RBC 3.28 (L) 4.22 - 5.81 MIL/uL   Hemoglobin 8.8 (L) 13.0 - 17.0 g/dL   HCT 27.4 (L) 39.0 - 52.0 %   MCV 83.5 80.0 -  100.0 fL   MCH 26.8 26.0 - 34.0 pg   MCHC 32.1 30.0 - 36.0 g/dL   RDW 12.9 11.5 - 15.5 %   Platelets 189 150 - 400 K/uL   nRBC 0.0 0.0 - 0.2 %    Comment: Performed at [facility], [place] [address], [place], [place] [address]  CBC     Status: Abnormal   Collection Time: 04/06/18 10:07 AM  Result Value Ref Range   WBC 5.5 4.0 - 10.5 K/uL   RBC 3.52 (L) 4.22 - 5.81 MIL/uL   Hemoglobin 9.4 (L) 13.0 - 17.0 g/dL   HCT 29.6 (L) 39.0 - 52.0 %   MCV 84.1 80.0 - 100.0 fL   MCH 26.7 26.0 - 34.0 pg   MCHC 31.8 30.0 - 36.0 g/dL   RDW 13.2 11.5 - 15.5 %   Platelets 198 150 - 400 K/uL   nRBC 0.0 0.0 - 0.2 %    Comment:  Performed at [facility], [place] [address], [place], [place] [address]    No results found.  Review of Systems  Constitutional: Negative.   HENT: Negative.   Eyes: Negative.   Respiratory: Negative.   Cardiovascular: Negative.   Gastrointestinal: Positive for blood in stool.  Genitourinary: Negative.   Musculoskeletal: Positive for back pain.  Skin: Negative.   Neurological: Positive for dizziness.  Endo/Heme/Allergies: Negative.   Psychiatric/Behavioral: Negative.    Blood pressure (!) 139/99, pulse 90, temperature 97.8 F (36.6 C), temperature source Oral, resp. rate 20, SpO2 99 %. Physical Exam  Constitutional: He is oriented to person, place, and time. He appears well-developed and well-nourished. No distress.  HENT:  Head: Normocephalic and atraumatic.  Mouth/Throat: No oropharyngeal exudate.  Eyes: Pupils are equal, round, and reactive to light. Conjunctivae and EOM are normal.  Neck: Normal range of motion. Neck supple.  Cardiovascular: Normal rate, regular rhythm and normal heart sounds.  Respiratory: Effort normal and breath sounds normal. No stridor. No respiratory distress.  GI: Soft. Bowel sounds are normal. There is no tenderness.  Musculoskeletal: Normal range of motion. He exhibits no edema or tenderness.  Neurological: He is alert and oriented to person, place, and time. Coordination normal.  Skin: Skin is warm and dry. No rash noted.  Psychiatric: He has a normal mood and affect. His behavior is normal. Thought content normal.    Assessment/Plan:  the patient appears to have a moderate sized mass in the upper rectum that has an appearance that is worrisome for colon cancer.  It does not appear to be obstructing.  At this point we will complete his workup with a CEA, a CT scan of the abdomen and pelvis.  We would like to know the results of the transverse colon lesion as well prior to operating.  I suspect that he will require a low anterior  resection to remove the lesion in the upper rectum.  I have discussed with him in detail the risks and benefits of the operation as well as some of the technical aspects including the risk of leak and he understands and wishes to proceed.  We will obtain the results of these preoperative studies and then begin surgical planning for later this week  Autumn Messing III 04/06/2018, 3:33 PM

## 2018-04-06 NOTE — Anesthesia Preprocedure Evaluation (Addendum)
Anesthesia Evaluation  Patient identified by MRN, date of birth, ID band Patient awake    Reviewed: Allergy & Precautions, NPO status , Patient's Chart, lab work & pertinent test results  History of Anesthesia Complications Negative for: history of anesthetic complications  Airway Mallampati: I  TM Distance: >3 FB Neck ROM: Full    Dental  (+) Dental Advisory Given, Teeth Intact   Pulmonary neg pulmonary ROS,    breath sounds clear to auscultation       Cardiovascular negative cardio ROS   Rhythm:Regular Rate:Normal     Neuro/Psych negative neurological ROS  negative psych ROS   GI/Hepatic Neg liver ROS,  GIB    Endo/Other  negative endocrine ROS  Renal/GU negative Renal ROS     Musculoskeletal negative musculoskeletal ROS (+)   Abdominal   Peds  Hematology  (+) anemia ,   Anesthesia Other Findings   Reproductive/Obstetrics                            Anesthesia Physical Anesthesia Plan  ASA: I  Anesthesia Plan: MAC   Post-op Pain Management:    Induction: Intravenous  PONV Risk Score and Plan: 1 and Propofol infusion and Treatment may vary due to age or medical condition  Airway Management Planned: Nasal Cannula and Natural Airway  Additional Equipment: None  Intra-op Plan:   Post-operative Plan:   Informed Consent: I have reviewed the patients History and Physical, chart, labs and discussed the procedure including the risks, benefits and alternatives for the proposed anesthesia with the patient or authorized representative who has indicated his/her understanding and acceptance.     Plan Discussed with: CRNA and Anesthesiologist  Anesthesia Plan Comments:        Anesthesia Quick Evaluation

## 2018-04-06 NOTE — Interval H&P Note (Signed)
History and Physical Interval Note: 41/male with rectal bleeding, anemia from blood loss and syncope, history of intermittent loose stools for 2 years,  for a colonoscopy today.  04/06/2018 11:33 AM  Carloyn Manner  has presented today for colonoscopy, with the diagnosis of Rectal bleeding  The various methods of treatment have been discussed with the patient and family. After consideration of risks, benefits and other options for treatment, the patient has consented to  Procedure(s): COLONOSCOPY WITH PROPOFOL (N/A) as a surgical intervention .  The patient's history has been reviewed, patient examined, no change in status, stable for surgery.  I have reviewed the patient's chart and labs.  Questions were answered to the patient's satisfaction.     Ronnette Juniper

## 2018-04-06 NOTE — Progress Notes (Signed)
Pt wife requests Dr. only give updates and scan results when she is present.

## 2018-04-06 NOTE — Op Note (Addendum)
Colonoscopy showed a frond-like/villous, infiltrative and polypoid partially obstructing large mass highly suspicious for malignancy, about 5 cm in size, 20 to 25 cm proximal to the anus.  The mass was partially circumferential involving two thirds of the luminal circumference.  Biopsies were taken with hot snare and the area was tattooed with 5 mL of [place] ink at the proximal and distal margins.  A 6 mm polyp was noted in transverse colon, removed by snare polypectomy. Small internal hemorrhoids were noted.   Recommendations: Clear liquid diet. Await pathology results. Surgical evaluation for resection of mass. CT of chest/abdomen/pelvis with contrast to rule out metastases. CEA level.   Ronnette Juniper, MD.

## 2018-04-07 DIAGNOSIS — K625 Hemorrhage of anus and rectum: Secondary | ICD-10-CM

## 2018-04-07 DIAGNOSIS — K922 Gastrointestinal hemorrhage, unspecified: Secondary | ICD-10-CM

## 2018-04-07 DIAGNOSIS — C187 Malignant neoplasm of sigmoid colon: Secondary | ICD-10-CM

## 2018-04-07 LAB — CBC
HEMATOCRIT: 28.9 % — AB (ref 39.0–52.0)
HEMOGLOBIN: 9.1 g/dL — AB (ref 13.0–17.0)
MCH: 26.8 pg (ref 26.0–34.0)
MCHC: 31.5 g/dL (ref 30.0–36.0)
MCV: 85 fL (ref 80.0–100.0)
NRBC: 0 % (ref 0.0–0.2)
Platelets: 208 10*3/uL (ref 150–400)
RBC: 3.4 MIL/uL — ABNORMAL LOW (ref 4.22–5.81)
RDW: 13.3 % (ref 11.5–15.5)
WBC: 8.1 10*3/uL (ref 4.0–10.5)

## 2018-04-07 LAB — MRSA PCR SCREENING: MRSA by PCR: NEGATIVE

## 2018-04-07 MED ORDER — GABAPENTIN 300 MG PO CAPS
300.0000 mg | ORAL_CAPSULE | ORAL | Status: AC
  Administered 2018-04-08: 300 mg via ORAL
  Filled 2018-04-07: qty 1

## 2018-04-07 MED ORDER — CHLORHEXIDINE GLUCONATE CLOTH 2 % EX PADS
6.0000 | MEDICATED_PAD | Freq: Once | CUTANEOUS | Status: AC
  Administered 2018-04-07: 6 via TOPICAL

## 2018-04-07 MED ORDER — CHLORHEXIDINE GLUCONATE CLOTH 2 % EX PADS: 6.0000 | MEDICATED_PAD | Freq: Once | CUTANEOUS | Status: DC

## 2018-04-07 MED ORDER — ACETAMINOPHEN 500 MG PO TABS
1000.0000 mg | ORAL_TABLET | ORAL | Status: AC
  Administered 2018-04-08: 1000 mg via ORAL
  Filled 2018-04-07: qty 2

## 2018-04-07 MED ORDER — ALVIMOPAN 12 MG PO CAPS: 12.0000 mg | ORAL_CAPSULE | ORAL | Status: DC

## 2018-04-07 MED ORDER — KCL IN DEXTROSE-NACL 20-5-0.9 MEQ/L-%-% IV SOLN
INTRAVENOUS | Status: DC
  Administered 2018-04-07 – 2018-04-11 (×6): via INTRAVENOUS
  Filled 2018-04-07 (×10): qty 1000

## 2018-04-07 MED ORDER — SODIUM CHLORIDE 0.9 % IV SOLN
2.0000 g | INTRAVENOUS | Status: AC
  Administered 2018-04-08: 2 g via INTRAVENOUS
  Filled 2018-04-07 (×2): qty 2

## 2018-04-07 MED ORDER — ALVIMOPAN 12 MG PO CAPS
12.0000 mg | ORAL_CAPSULE | ORAL | Status: AC
  Administered 2018-04-08: 12 mg via ORAL
  Filled 2018-04-07: qty 1

## 2018-04-07 NOTE — Progress Notes (Signed)
PROGRESS NOTE    Charles Rowland  GGY:[id number] DOB: [birth year] DOA: 04/04/2018 PCP: Dineen Kid, MD      Brief Narrative:  [name] is a 42 y.o. M with no significant PMHx who presents with several days of fairly heavy bright red blood per rectum.  In ER, Hgb was down to 8.2 g/dL from 11 three days prior, and patient had bright red bloody BM.  Admitted and planned for colonoscopy.     Assessment & Plan:  Suspected colorectal cancer Biopsy pending.  5cm lesion, about 20 cm from anal verge, complete resection likely, neo-adj not needed.  CT C/A/P shows no mets.  CEA pending. -General surgery are involved, plan to take to OR tomorrow pending biopsy results -Gastroenterology are involved, appreciate expert cares -Follow CEA    Acute blood loss anemia Transfused 2 units.  No further bloody BM.  Tolerated diet today. Hgb stable from yesterday, 9.1 g/dL.     MDM and disposition: The below labs and imaging reports were reviewed and summarized above.  Medication management as above.  The patient was admitted with acute blood loss anemia. He has what is likely colorectal cancer.  Biopsy results are pending, regardless of if this is villous adenoma or adenoCA, will need partial colectomy.  Complete staging after that.        DVT prophylaxis: SCDs Code Status: FULL Family Communication: None present    Consultants:   [facility]  Gastroenterology  Procedures:   Colonoscopy Colonoscopy showed a frond-like/villous, infiltrative and polypoid partially obstructing large mass highly suspicious for malignancy, about 5 cm in size, 20 to 25 cm proximal to the anus.  The mass was partially circumferential involving two thirds of the luminal circumference.  Biopsies were taken with hot snare and the area was tattooed with 5 mL of [place] ink at the proximal and distal margins.  A 6 mm polyp was noted in transverse colon, removed by snare polypectomy. Small internal  hemorrhoids were noted.   Recommendations: Clear liquid diet. Await pathology results. Surgical evaluation for resection of mass. CT of chest/abdomen/pelvis with contrast to rule out metastases. CEA level.   Antimicrobials:   None    Subjective: Feeling well.  No hematochezia.  No confusion.  Mental state is much better than it was.  Some crampy lower abdominal pain, mild headache, minimal.  Objective: Vitals:   04/06/18 1505 04/06/18 1848 04/06/18 2220 04/07/18 0644  BP: (!) 139/99 (!) 145/101 (!) 141/86 112/74  Pulse: 90 (!) 115 (!) 121 87  Resp: 20  18 18   Temp: 97.8 F (36.6 C)  99.2 F (37.3 C) 98 F (36.7 C)  TempSrc: Oral  Oral Oral  SpO2: 99%  96% 98%    Intake/Output Summary (Last 24 hours) at 04/07/2018 1258 Last data filed at 04/07/2018 0600 Gross per 24 hour  Intake 1730.05 ml  Output -  Net 1730.05 ml   There were no vitals filed for this visit.  Examination: General appearance:  adult male, alert and in no acute distress.   HEENT: Anicteric, conjunctiva pink, lids and lashes normal. No nasal deformity, discharge, epistaxis.  Lips moist.   Skin: Warm and dry.  no jaundice.  No suspicious rashes or lesions. Cardiac: RRR, nl S1-S2, no murmurs appreciated.  Capillary refill is brisk.    Respiratory: Normal respiratory rate and rhythm.  CTAB without rales or wheezes. Abdomen: Abdomen soft.  No TTP. No ascites, distension, hepatosplenomegaly.   MSK: No deformities or effusions. Neuro: Awake and alert.  EOMI, moves all extremities. Speech fluent.    Psych: Sensorium intact and responding to questions, attention normal. Affect normal.  Judgment and insight appear normal.    Data Reviewed: I have personally reviewed following labs and imaging studies:  CBC: Recent Labs  Lab 04/05/18 1635 04/05/18 1819 04/06/18 0045 04/06/18 1007 04/07/18 0334  WBC 5.2 5.4 5.9 5.5 8.1  HGB 9.2* 9.4* 8.8* 9.4* 9.1*  HCT 29.1* 29.5* 27.4* 29.6* 28.9*  MCV 84.1  84.8 83.5 84.1 85.0  PLT 197 189 189 198 371   Basic Metabolic Panel: Recent Labs  Lab 04/04/18 1955 04/04/18 1959 04/05/18 0613  NA 135 138 137  K 3.4* 3.5 3.8  CL 103 103 109  CO2 23  --  23  GLUCOSE 123* 121* 95  BUN 15 13 14   CREATININE 1.17 1.20 1.09  CALCIUM 8.5*  --  8.2*  MG  --   --  2.0  PHOS  --   --  2.8   GFR: CrCl cannot be calculated (Unknown ideal weight.). Liver Function Tests: Recent Labs  Lab 04/04/18 1955 04/05/18 0613  AST 18 15  ALT 14 13  ALKPHOS 47 42  BILITOT 0.4 0.6  PROT 6.8 6.0*  ALBUMIN 3.9 3.4*   No results for input(s): LIPASE, AMYLASE in the last 168 hours. No results for input(s): AMMONIA in the last 168 hours. Coagulation Profile: No results for input(s): INR, PROTIME in the last 168 hours. Cardiac Enzymes: Recent Labs  Lab 04/04/18 1950 04/05/18 0008 04/05/18 0613 04/05/18 1200  TROPONINI <0.03 <0.03 <0.03 <0.03   BNP (last 3 results) No results for input(s): PROBNP in the last 8760 hours. HbA1C: No results for input(s): HGBA1C in the last 72 hours. CBG: No results for input(s): GLUCAP in the last 168 hours. Lipid Profile: No results for input(s): CHOL, HDL, LDLCALC, TRIG, CHOLHDL, LDLDIRECT in the last 72 hours. Thyroid Function Tests: Recent Labs    04/05/18 0613  TSH 3.129   Anemia Panel: Recent Labs    04/04/18 1950 04/05/18 1201  VITAMINB12  --  225  FOLATE  --  15.1  FERRITIN  --  6*  TIBC  --  367  IRON  --  21*  RETICCTPCT 2.7  --    Urine analysis: No results found for: COLORURINE, APPEARANCEUR, LABSPEC, PHURINE, GLUCOSEU, HGBUR, BILIRUBINUR, KETONESUR, PROTEINUR, UROBILINOGEN, NITRITE, LEUKOCYTESUR Sepsis Labs: @LABRCNTIP (procalcitonin:4,lacticacidven:4)  )No results found for this or any previous visit (from the past 240 hour(s)).       Radiology Studies: Ct Chest W Contrast  Result Date: 04/06/2018 CLINICAL DATA:  42 year old male with newly diagnosed rectal mass at colonoscopy today.  Evaluate for metastatic disease. EXAM: CT CHEST, ABDOMEN, AND PELVIS WITH CONTRAST TECHNIQUE: Multidetector CT imaging of the chest, abdomen and pelvis was performed following the standard protocol during bolus administration of intravenous contrast. CONTRAST:  157mL OMNIPAQUE IOHEXOL 300 MG/ML  SOLN COMPARISON:  None. FINDINGS: CT CHEST FINDINGS Cardiovascular: No significant vascular findings. Normal heart size. No pericardial effusion. Mediastinum/Nodes: No enlarged mediastinal, hilar, or axillary lymph nodes. Thyroid gland, trachea, and esophagus demonstrate no significant findings. Lungs/Pleura: Lungs are clear. No pulmonary nodules or masses. No pleural effusion or pneumothorax. Musculoskeletal: No chest wall mass or suspicious bone lesions identified. CT ABDOMEN PELVIS FINDINGS Hepatobiliary: The liver and gallbladder are unremarkable. No focal hepatic lesions identified. No biliary dilatation. Pancreas: Unremarkable Spleen: Unremarkable Adrenals/Urinary Tract: The kidneys, adrenal glands and bladder are unremarkable except for bilateral renal cysts. Stomach/Bowel: Apparent circumferential wall  thickening of the sigmoid colon (series 2: Image [id number]) may represent this patient's known colonic mass. No adjacent enlarged lymph nodes. No other bowel abnormalities are identified. Vascular/Lymphatic: No significant vascular findings are present. No enlarged abdominal or pelvic lymph nodes. Reproductive: Prostate is unremarkable. Other: No ascites, pneumoperitoneum, focal collection or peritoneal/omental nodularity. Musculoskeletal: No acute or suspicious bony abnormalities. IMPRESSION: 1. Apparent circumferential wall thickening of the sigmoid colon which may represent this patient's known colonic mass. No evidence of enlarged lymph nodes or metastatic disease. 2. No other significant abnormalities. Electronically Signed   By: Margarette Canada M.D.   On: 04/06/2018 20:24   Ct Abdomen Pelvis W Contrast  Result  Date: 04/06/2018 CLINICAL DATA:  42 year old male with newly diagnosed rectal mass at colonoscopy today. Evaluate for metastatic disease. EXAM: CT CHEST, ABDOMEN, AND PELVIS WITH CONTRAST TECHNIQUE: Multidetector CT imaging of the chest, abdomen and pelvis was performed following the standard protocol during bolus administration of intravenous contrast. CONTRAST:  167mL OMNIPAQUE IOHEXOL 300 MG/ML  SOLN COMPARISON:  None. FINDINGS: CT CHEST FINDINGS Cardiovascular: No significant vascular findings. Normal heart size. No pericardial effusion. Mediastinum/Nodes: No enlarged mediastinal, hilar, or axillary lymph nodes. Thyroid gland, trachea, and esophagus demonstrate no significant findings. Lungs/Pleura: Lungs are clear. No pulmonary nodules or masses. No pleural effusion or pneumothorax. Musculoskeletal: No chest wall mass or suspicious bone lesions identified. CT ABDOMEN PELVIS FINDINGS Hepatobiliary: The liver and gallbladder are unremarkable. No focal hepatic lesions identified. No biliary dilatation. Pancreas: Unremarkable Spleen: Unremarkable Adrenals/Urinary Tract: The kidneys, adrenal glands and bladder are unremarkable except for bilateral renal cysts. Stomach/Bowel: Apparent circumferential wall thickening of the sigmoid colon (series 2: Image [id number]) may represent this patient's known colonic mass. No adjacent enlarged lymph nodes. No other bowel abnormalities are identified. Vascular/Lymphatic: No significant vascular findings are present. No enlarged abdominal or pelvic lymph nodes. Reproductive: Prostate is unremarkable. Other: No ascites, pneumoperitoneum, focal collection or peritoneal/omental nodularity. Musculoskeletal: No acute or suspicious bony abnormalities. IMPRESSION: 1. Apparent circumferential wall thickening of the sigmoid colon which may represent this patient's known colonic mass. No evidence of enlarged lymph nodes or metastatic disease. 2. No other significant abnormalities.  Electronically Signed   By: Margarette Canada M.D.   On: 04/06/2018 20:24        Scheduled Meds: . sodium chloride   Intravenous Once   Continuous Infusions: . sodium chloride 75 mL/hr at 04/06/18 2354     LOS: 3 days    Time spent: 25 minutes    Edwin Dada, MD [facility] 04/07/2018, 12:58 PM     Pager [phone] --- please page though AMION:  [email or web address] Password [email or web address] If 7PM-7AM, please contact night-coverage

## 2018-04-07 NOTE — Progress Notes (Addendum)
[facility] Progress Note  1 Day Post-Op  Subjective: Patient doing well overall this morning. He reports some persistent anxiety related to his current condition and pending pathology. He reports having some "spasming rectal pain" yesterday that is improved today. He also reports some gas pains and bloating that have improved since yesterday. Otherwise, he reports no pain/discomfort. No NV. His appetite is good and he is tolerating PO clear liquid diet without issue. He has not had a BM since yesterday and has not noticed any rectal bleeding. He denies syncopal episodes, dizziness, chest pain or SOB.  Wife was with him in the room. Intermittently emotional about situation.  Objective: Vital signs in last 24 hours: Temp:  [97.8 F (36.6 C)-99.2 F (37.3 C)] 98 F (36.7 C) (11/05 0644) Pulse Rate:  [83-121] 87 (11/05 0644) Resp:  [12-21] 18 (11/05 0644) BP: (112-183)/(69-107) 112/74 (11/05 0644) SpO2:  [96 %-100 %] 98 % (11/05 0644) Last BM Date: 04/06/18  Intake/Output from previous day: 11/04 0701 - 11/05 0700 In: 2622.4 [P.O.:530; I.V.:2092.4] Out: -  Intake/Output this shift: No intake/output data recorded.  PE: Gen:  Alert, NAD, pleasant, sitting up comfortably in bed. Card:  Regular rate and rhythm Pulm:  Normal effort, CTA, no wheezing  Abd: Soft, non-tender, non-distended, bowel sounds present in all 4 quadrants Skin: warm and dry, no rashes  Psych: A&Ox3   Lab Results:  Recent Labs    04/06/18 1007 04/07/18 0334  WBC 5.5 8.1  HGB 9.4* 9.1*  HCT 29.6* 28.9*  PLT 198 208   BMET Recent Labs    04/04/18 1955 04/04/18 1959 04/05/18 0613  NA 135 138 137  K 3.4* 3.5 3.8  CL 103 103 109  CO2 23  --  23  GLUCOSE 123* 121* 95  BUN 15 13 14   CREATININE 1.17 1.20 1.09  CALCIUM 8.5*  --  8.2*   PT/INR No results for input(s): LABPROT, INR in the last 72 hours. CMP     Component Value Date/Time   NA 137 04/05/2018 0613   K 3.8 04/05/2018 0613    CL 109 04/05/2018 0613   CO2 23 04/05/2018 0613   GLUCOSE 95 04/05/2018 0613   BUN 14 04/05/2018 0613   CREATININE 1.09 04/05/2018 0613   CALCIUM 8.2 (L) 04/05/2018 0613   PROT 6.0 (L) 04/05/2018 0613   ALBUMIN 3.4 (L) 04/05/2018 0613   AST 15 04/05/2018 0613   ALT 13 04/05/2018 0613   ALKPHOS 42 04/05/2018 0613   BILITOT 0.6 04/05/2018 0613   GFRNONAA >60 04/05/2018 0613   GFRAA >60 04/05/2018 0613   Lipase  No results found for: LIPASE     Studies/Results: Ct Chest W Contrast  Result Date: 04/06/2018 CLINICAL DATA:  42 year old male with newly diagnosed rectal mass at colonoscopy today. Evaluate for metastatic disease. EXAM: CT CHEST, ABDOMEN, AND PELVIS WITH CONTRAST TECHNIQUE: Multidetector CT imaging of the chest, abdomen and pelvis was performed following the standard protocol during bolus administration of intravenous contrast. CONTRAST:  128mL OMNIPAQUE IOHEXOL 300 MG/ML  SOLN COMPARISON:  None. FINDINGS: CT CHEST FINDINGS Cardiovascular: No significant vascular findings. Normal heart size. No pericardial effusion. Mediastinum/Nodes: No enlarged mediastinal, hilar, or axillary lymph nodes. Thyroid gland, trachea, and esophagus demonstrate no significant findings. Lungs/Pleura: Lungs are clear. No pulmonary nodules or masses. No pleural effusion or pneumothorax. Musculoskeletal: No chest wall mass or suspicious bone lesions identified. CT ABDOMEN PELVIS FINDINGS Hepatobiliary: The liver and gallbladder are unremarkable. No focal hepatic lesions identified.  No biliary dilatation. Pancreas: Unremarkable Spleen: Unremarkable Adrenals/Urinary Tract: The kidneys, adrenal glands and bladder are unremarkable except for bilateral renal cysts. Stomach/Bowel: Apparent circumferential wall thickening of the sigmoid colon (series 2: Image [id number]) may represent this patient's known colonic mass. No adjacent enlarged lymph nodes. No other bowel abnormalities are identified. Vascular/Lymphatic:  No significant vascular findings are present. No enlarged abdominal or pelvic lymph nodes. Reproductive: Prostate is unremarkable. Other: No ascites, pneumoperitoneum, focal collection or peritoneal/omental nodularity. Musculoskeletal: No acute or suspicious bony abnormalities. IMPRESSION: 1. Apparent circumferential wall thickening of the sigmoid colon which may represent this patient's known colonic mass. No evidence of enlarged lymph nodes or metastatic disease. 2. No other significant abnormalities. Electronically Signed   By: Margarette Canada M.D.   On: 04/06/2018 20:24   Ct Abdomen Pelvis W Contrast  Result Date: 04/06/2018 CLINICAL DATA:  42 year old male with newly diagnosed rectal mass at colonoscopy today. Evaluate for metastatic disease. EXAM: CT CHEST, ABDOMEN, AND PELVIS WITH CONTRAST TECHNIQUE: Multidetector CT imaging of the chest, abdomen and pelvis was performed following the standard protocol during bolus administration of intravenous contrast. CONTRAST:  120mL OMNIPAQUE IOHEXOL 300 MG/ML  SOLN COMPARISON:  None. FINDINGS: CT CHEST FINDINGS Cardiovascular: No significant vascular findings. Normal heart size. No pericardial effusion. Mediastinum/Nodes: No enlarged mediastinal, hilar, or axillary lymph nodes. Thyroid gland, trachea, and esophagus demonstrate no significant findings. Lungs/Pleura: Lungs are clear. No pulmonary nodules or masses. No pleural effusion or pneumothorax. Musculoskeletal: No chest wall mass or suspicious bone lesions identified. CT ABDOMEN PELVIS FINDINGS Hepatobiliary: The liver and gallbladder are unremarkable. No focal hepatic lesions identified. No biliary dilatation. Pancreas: Unremarkable Spleen: Unremarkable Adrenals/Urinary Tract: The kidneys, adrenal glands and bladder are unremarkable except for bilateral renal cysts. Stomach/Bowel: Apparent circumferential wall thickening of the sigmoid colon (series 2: Image [id number]) may represent this patient's known colonic  mass. No adjacent enlarged lymph nodes. No other bowel abnormalities are identified. Vascular/Lymphatic: No significant vascular findings are present. No enlarged abdominal or pelvic lymph nodes. Reproductive: Prostate is unremarkable. Other: No ascites, pneumoperitoneum, focal collection or peritoneal/omental nodularity. Musculoskeletal: No acute or suspicious bony abnormalities. IMPRESSION: 1. Apparent circumferential wall thickening of the sigmoid colon which may represent this patient's known colonic mass. No evidence of enlarged lymph nodes or metastatic disease. 2. No other significant abnormalities. Electronically Signed   By: Margarette Canada M.D.   On: 04/06/2018 20:24    Anti-infectives: Anti-infectives (From admission, onward)   None       Assessment/Plan 1. Hx rectal bleeding / Rectal mass / Transverse colon polyp S/p colonoscopy on 11/4 which shows partially obstructing rectal mass approx. 5cm in size, as well as a 4mm polyp in the transverse colon. Biopsies taken. Awaiting results of pathology.  -  Patient seems well overall. He is anxiously awaiting his pathology results, but otherwise in no distress or discomfort.  - Patient reports intermittent rectal spasms, gas pains and bloating that seem to have resolved/improved since yesterday. This was likely due to his colonoscopy. I advised him to let us know should this pain return or worsen. -Labs are stable. Hgb and Hct remain low, but stable. (Hgb 9.1, HCT 28.9)  -  Patient scheduled for laparoscopic low anterior resection with ERAS tomorrow (04/08/18) with Dr. Marlou Starks.  -  Continue clear liquid diet. NPO 3hrs prior to procedure. No additional bowel prep needed at this time. -  Continue PO xanax PRN for anxiety    LOS: 3 days    Vita Barley, PA-S

## 2018-04-07 NOTE — Progress Notes (Signed)
Subjective: Was seen and examined at bedside. Discussed about CAT scan findings showing no evidence of metastases.  Objective: Vital signs in last 24 hours: Temp:  [97.9 F (36.6 C)-99.2 F (37.3 C)] 97.9 F (36.6 C) (11/05 1346) Pulse Rate:  [87-121] 106 (11/05 1400) Resp:  [18] 18 (11/05 1346) BP: (112-147)/(74-102) 139/91 (11/05 1400) SpO2:  [96 %-98 %] 98 % (11/05 1346) Weight change:  Last BM Date: 04/06/18  PE:not in distress GENERAL:mild pallor ABDOMEN:soft, nondistended, nontender, normoactive bowel sounds EXTREMITIES:no deformity  Lab Results: Results for orders placed or performed during the hospital encounter of 04/04/18 (from the past 48 hour(s))  CBC     Status: Abnormal   Collection Time: 04/05/18  4:35 PM  Result Value Ref Range   WBC 5.2 4.0 - 10.5 K/uL   RBC 3.46 (L) 4.22 - 5.81 MIL/uL   Hemoglobin 9.2 (L) 13.0 - 17.0 g/dL   HCT 29.1 (L) 39.0 - 52.0 %   MCV 84.1 80.0 - 100.0 fL   MCH 26.6 26.0 - 34.0 pg   MCHC 31.6 30.0 - 36.0 g/dL   RDW 13.0 11.5 - 15.5 %   Platelets 197 150 - 400 K/uL   nRBC 0.0 0.0 - 0.2 %    Comment: Performed at [facility], [place] [address], [place], [place] [address]  CBC     Status: Abnormal   Collection Time: 04/05/18  6:19 PM  Result Value Ref Range   WBC 5.4 4.0 - 10.5 K/uL   RBC 3.48 (L) 4.22 - 5.81 MIL/uL   Hemoglobin 9.4 (L) 13.0 - 17.0 g/dL   HCT 29.5 (L) 39.0 - 52.0 %   MCV 84.8 80.0 - 100.0 fL   MCH 27.0 26.0 - 34.0 pg   MCHC 31.9 30.0 - 36.0 g/dL   RDW 13.2 11.5 - 15.5 %   Platelets 189 150 - 400 K/uL   nRBC 0.0 0.0 - 0.2 %    Comment: Performed at [facility], [place] [address], [place], [place] [address]  CBC     Status: Abnormal   Collection Time: 04/06/18 12:45 AM  Result Value Ref Range   WBC 5.9 4.0 - 10.5 K/uL   RBC 3.28 (L) 4.22 - 5.81 MIL/uL   Hemoglobin 8.8 (L) 13.0 - 17.0 g/dL   HCT 27.4 (L) 39.0 - 52.0 %   MCV 83.5 80.0 - 100.0 fL   MCH 26.8 26.0 - 34.0 pg    MCHC 32.1 30.0 - 36.0 g/dL   RDW 12.9 11.5 - 15.5 %   Platelets 189 150 - 400 K/uL   nRBC 0.0 0.0 - 0.2 %    Comment: Performed at [facility], [place] [address] [place], [place] [address]  CBC     Status: Abnormal   Collection Time: 04/06/18 10:07 AM  Result Value Ref Range   WBC 5.5 4.0 - 10.5 K/uL   RBC 3.52 (L) 4.22 - 5.81 MIL/uL   Hemoglobin 9.4 (L) 13.0 - 17.0 g/dL   HCT 29.6 (L) 39.0 - 52.0 %   MCV 84.1 80.0 - 100.0 fL   MCH 26.7 26.0 - 34.0 pg   MCHC 31.8 30.0 - 36.0 g/dL   RDW 13.2 11.5 - 15.5 %   Platelets 198 150 - 400 K/uL   nRBC 0.0 0.0 - 0.2 %    Comment: Performed at [facility], [place] [address], [place], [place] [address]  CBC     Status: Abnormal  Collection Time: 04/07/18  3:34 AM  Result Value Ref Range   WBC 8.1 4.0 - 10.5 K/uL   RBC 3.40 (L) 4.22 - 5.81 MIL/uL   Hemoglobin 9.1 (L) 13.0 - 17.0 g/dL   HCT 28.9 (L) 39.0 - 52.0 %   MCV 85.0 80.0 - 100.0 fL   MCH 26.8 26.0 - 34.0 pg   MCHC 31.5 30.0 - 36.0 g/dL   RDW 13.3 11.5 - 15.5 %   Platelets 208 150 - 400 K/uL   nRBC 0.0 0.0 - 0.2 %    Comment: Performed at [facility], [place] [address] [place], [place] [address]    Studies/Results: Ct Chest W Contrast  Result Date: 04/06/2018 CLINICAL DATA:  42 year old male with newly diagnosed rectal mass at colonoscopy today. Evaluate for metastatic disease. EXAM: CT CHEST, ABDOMEN, AND PELVIS WITH CONTRAST TECHNIQUE: Multidetector CT imaging of the chest, abdomen and pelvis was performed following the standard protocol during bolus administration of intravenous contrast. CONTRAST:  165mL OMNIPAQUE IOHEXOL 300 MG/ML  SOLN COMPARISON:  None. FINDINGS: CT CHEST FINDINGS Cardiovascular: No significant vascular findings. Normal heart size. No pericardial effusion. Mediastinum/Nodes: No enlarged mediastinal, hilar, or axillary lymph nodes. Thyroid gland, trachea, and esophagus demonstrate no significant findings.  Lungs/Pleura: Lungs are clear. No pulmonary nodules or masses. No pleural effusion or pneumothorax. Musculoskeletal: No chest wall mass or suspicious bone lesions identified. CT ABDOMEN PELVIS FINDINGS Hepatobiliary: The liver and gallbladder are unremarkable. No focal hepatic lesions identified. No biliary dilatation. Pancreas: Unremarkable Spleen: Unremarkable Adrenals/Urinary Tract: The kidneys, adrenal glands and bladder are unremarkable except for bilateral renal cysts. Stomach/Bowel: Apparent circumferential wall thickening of the sigmoid colon (series 2: Image [id number]) may represent this patient's known colonic mass. No adjacent enlarged lymph nodes. No other bowel abnormalities are identified. Vascular/Lymphatic: No significant vascular findings are present. No enlarged abdominal or pelvic lymph nodes. Reproductive: Prostate is unremarkable. Other: No ascites, pneumoperitoneum, focal collection or peritoneal/omental nodularity. Musculoskeletal: No acute or suspicious bony abnormalities. IMPRESSION: 1. Apparent circumferential wall thickening of the sigmoid colon which may represent this patient's known colonic mass. No evidence of enlarged lymph nodes or metastatic disease. 2. No other significant abnormalities. Electronically Signed   By: Margarette Canada M.D.   On: 04/06/2018 20:24   Ct Abdomen Pelvis W Contrast  Result Date: 04/06/2018 CLINICAL DATA:  42 year old male with newly diagnosed rectal mass at colonoscopy today. Evaluate for metastatic disease. EXAM: CT CHEST, ABDOMEN, AND PELVIS WITH CONTRAST TECHNIQUE: Multidetector CT imaging of the chest, abdomen and pelvis was performed following the standard protocol during bolus administration of intravenous contrast. CONTRAST:  144mL OMNIPAQUE IOHEXOL 300 MG/ML  SOLN COMPARISON:  None. FINDINGS: CT CHEST FINDINGS Cardiovascular: No significant vascular findings. Normal heart size. No pericardial effusion. Mediastinum/Nodes: No enlarged mediastinal, hilar,  or axillary lymph nodes. Thyroid gland, trachea, and esophagus demonstrate no significant findings. Lungs/Pleura: Lungs are clear. No pulmonary nodules or masses. No pleural effusion or pneumothorax. Musculoskeletal: No chest wall mass or suspicious bone lesions identified. CT ABDOMEN PELVIS FINDINGS Hepatobiliary: The liver and gallbladder are unremarkable. No focal hepatic lesions identified. No biliary dilatation. Pancreas: Unremarkable Spleen: Unremarkable Adrenals/Urinary Tract: The kidneys, adrenal glands and bladder are unremarkable except for bilateral renal cysts. Stomach/Bowel: Apparent circumferential wall thickening of the sigmoid colon (series 2: Image [id number]) may represent this patient's known colonic mass. No adjacent enlarged lymph nodes. No other bowel abnormalities are identified. Vascular/Lymphatic: No significant vascular findings are present. No enlarged abdominal or pelvic lymph nodes.  Reproductive: Prostate is unremarkable. Other: No ascites, pneumoperitoneum, focal collection or peritoneal/omental nodularity. Musculoskeletal: No acute or suspicious bony abnormalities. IMPRESSION: 1. Apparent circumferential wall thickening of the sigmoid colon which may represent this patient's known colonic mass. No evidence of enlarged lymph nodes or metastatic disease. 2. No other significant abnormalities. Electronically Signed   By: Margarette Canada M.D.   On: 04/06/2018 20:24    Medications: I have reviewed the patient's current medications.  Assessment: Sigmoid mass-biopsies compatible with adenocarcinoma No evidence of metastases  Plan: Sigmoid colectomy planned for tomorrow morning. Recommend oncology evaluation thereafter. Post surgery, patient needs surveillance colonoscopy in 1 year, then 3 years and then every 5 years thereafter.   Ronnette Juniper 04/07/2018, 4:03 PM   Pager [phone] If no answer or after 5 PM call [phone]

## 2018-04-08 ENCOUNTER — Inpatient Hospital Stay (HOSPITAL_COMMUNITY): Payer: 59 | Admitting: Certified Registered Nurse Anesthetist

## 2018-04-08 ENCOUNTER — Encounter (HOSPITAL_COMMUNITY): Payer: Self-pay | Admitting: Gastroenterology

## 2018-04-08 ENCOUNTER — Encounter (HOSPITAL_COMMUNITY)
Admission: EM | Disposition: A | Discharge status: Home / Self Care | Payer: Self-pay | Source: Home / Self Care | Attending: General Surgery

## 2018-04-08 HISTORY — PX: LAPAROSCOPIC SMALL BOWEL RESECTION: SHX5929

## 2018-04-08 LAB — BASIC METABOLIC PANEL
ANION GAP: 7 (ref 5–15)
BUN: 9 mg/dL (ref 6–20)
CALCIUM: 8.8 mg/dL — AB (ref 8.9–10.3)
CO2: 25 mmol/L (ref 22–32)
Chloride: 108 mmol/L (ref 98–111)
Creatinine, Ser: 1.16 mg/dL (ref 0.61–1.24)
GLUCOSE: 116 mg/dL — AB (ref 70–99)
POTASSIUM: 3.6 mmol/L (ref 3.5–5.1)
SODIUM: 140 mmol/L (ref 135–145)

## 2018-04-08 LAB — CBC
HCT: 28.4 % — ABNORMAL LOW (ref 39.0–52.0)
Hemoglobin: 8.9 g/dL — ABNORMAL LOW (ref 13.0–17.0)
MCH: 26.8 pg (ref 26.0–34.0)
MCHC: 31.3 g/dL (ref 30.0–36.0)
MCV: 85.5 fL (ref 80.0–100.0)
PLATELETS: 231 10*3/uL (ref 150–400)
RBC: 3.32 MIL/uL — AB (ref 4.22–5.81)
RDW: 13.6 % (ref 11.5–15.5)
WBC: 4.5 10*3/uL (ref 4.0–10.5)
nRBC: 0 % (ref 0.0–0.2)

## 2018-04-08 LAB — CEA: CEA: 2.7 ng/mL (ref 0.0–4.7)

## 2018-04-08 LAB — HEMOGLOBIN A1C
HEMOGLOBIN A1C: 4.9 % (ref 4.8–5.6)
Mean Plasma Glucose: 93.93 mg/dL

## 2018-04-08 SURGERY — EXCISION, SMALL INTESTINE, LAPAROSCOPIC
Anesthesia: General | Site: Abdomen

## 2018-04-08 MED ORDER — HYDRALAZINE HCL 20 MG/ML IJ SOLN
INTRAMUSCULAR | Status: AC
  Filled 2018-04-08: qty 1

## 2018-04-08 MED ORDER — ONDANSETRON HCL 4 MG/2ML IJ SOLN
INTRAMUSCULAR | Status: DC | PRN
  Administered 2018-04-08: 4 mg via INTRAVENOUS

## 2018-04-08 MED ORDER — HYDRALAZINE HCL 20 MG/ML IJ SOLN
10.0000 mg | Freq: Once | INTRAMUSCULAR | Status: AC
  Administered 2018-04-08: 10 mg via INTRAVENOUS

## 2018-04-08 MED ORDER — HYDROMORPHONE HCL 1 MG/ML IJ SOLN
0.2500 mg | INTRAMUSCULAR | Status: DC | PRN
  Administered 2018-04-08 (×4): 0.5 mg via INTRAVENOUS

## 2018-04-08 MED ORDER — HYDROMORPHONE HCL 1 MG/ML IJ SOLN
INTRAMUSCULAR | Status: AC
  Filled 2018-04-08: qty 1

## 2018-04-08 MED ORDER — 0.9 % SODIUM CHLORIDE (POUR BTL) OPTIME
TOPICAL | Status: DC | PRN
  Administered 2018-04-08: 2000 mL

## 2018-04-08 MED ORDER — MIDAZOLAM HCL 2 MG/2ML IJ SOLN
INTRAMUSCULAR | Status: AC
  Filled 2018-04-08: qty 2

## 2018-04-08 MED ORDER — PROPOFOL 10 MG/ML IV BOLUS
INTRAVENOUS | Status: DC | PRN
  Administered 2018-04-08: 180 mg via INTRAVENOUS

## 2018-04-08 MED ORDER — DEXAMETHASONE SODIUM PHOSPHATE 10 MG/ML IJ SOLN
INTRAMUSCULAR | Status: DC | PRN
  Administered 2018-04-08: 10 mg via INTRAVENOUS

## 2018-04-08 MED ORDER — BUPIVACAINE-EPINEPHRINE 0.25% -1:200000 IJ SOLN
INTRAMUSCULAR | Status: DC | PRN
  Administered 2018-04-08: 20 mL

## 2018-04-08 MED ORDER — KETAMINE HCL 10 MG/ML IJ SOLN
INTRAMUSCULAR | Status: DC | PRN
  Administered 2018-04-08 (×2): 30 mg via INTRAVENOUS

## 2018-04-08 MED ORDER — ALVIMOPAN 12 MG PO CAPS
12.0000 mg | ORAL_CAPSULE | Freq: Two times a day (BID) | ORAL | Status: DC
  Administered 2018-04-09 – 2018-04-10 (×4): 12 mg via ORAL
  Filled 2018-04-08 (×4): qty 1

## 2018-04-08 MED ORDER — BUPIVACAINE-EPINEPHRINE (PF) 0.25% -1:200000 IJ SOLN
INTRAMUSCULAR | Status: AC
  Filled 2018-04-08: qty 30

## 2018-04-08 MED ORDER — ONDANSETRON HCL 4 MG/2ML IJ SOLN: 4.0000 mg | Freq: Four times a day (QID) | INTRAMUSCULAR | Status: DC | PRN

## 2018-04-08 MED ORDER — MIDAZOLAM HCL 5 MG/5ML IJ SOLN
INTRAMUSCULAR | Status: DC | PRN
  Administered 2018-04-08: 2 mg via INTRAVENOUS

## 2018-04-08 MED ORDER — LACTATED RINGERS IV SOLN
INTRAVENOUS | Status: DC | PRN
  Administered 2018-04-08: 11:00:00 via INTRAVENOUS

## 2018-04-08 MED ORDER — PHENYLEPHRINE 40 MCG/ML (10ML) SYRINGE FOR IV PUSH (FOR BLOOD PRESSURE SUPPORT)
PREFILLED_SYRINGE | INTRAVENOUS | Status: AC
  Filled 2018-04-08: qty 20

## 2018-04-08 MED ORDER — LIDOCAINE 2% (20 MG/ML) 5 ML SYRINGE
INTRAMUSCULAR | Status: AC
  Filled 2018-04-08: qty 5

## 2018-04-08 MED ORDER — PHENYLEPHRINE 40 MCG/ML (10ML) SYRINGE FOR IV PUSH (FOR BLOOD PRESSURE SUPPORT)
PREFILLED_SYRINGE | INTRAVENOUS | Status: DC | PRN
  Administered 2018-04-08: 80 ug via INTRAVENOUS

## 2018-04-08 MED ORDER — LACTATED RINGERS IV SOLN
INTRAVENOUS | Status: DC
  Administered 2018-04-08 (×2): via INTRAVENOUS

## 2018-04-08 MED ORDER — FENTANYL CITRATE (PF) 100 MCG/2ML IJ SOLN
INTRAMUSCULAR | Status: AC
  Filled 2018-04-08: qty 2

## 2018-04-08 MED ORDER — MORPHINE SULFATE 2 MG/ML IV SOLN
INTRAVENOUS | Status: DC
  Administered 2018-04-08: 2 mg via INTRAVENOUS
  Administered 2018-04-08: 13.5 mg via INTRAVENOUS
  Administered 2018-04-08: 12 mg via INTRAVENOUS
  Administered 2018-04-09: 0 mg via INTRAVENOUS
  Administered 2018-04-09 (×2): 1.5 mg via INTRAVENOUS
  Filled 2018-04-08: qty 30

## 2018-04-08 MED ORDER — SODIUM CHLORIDE 0.9% FLUSH: 9.0000 mL | INTRAVENOUS | Status: DC | PRN

## 2018-04-08 MED ORDER — DIPHENHYDRAMINE HCL 12.5 MG/5ML PO ELIX
12.5000 mg | ORAL_SOLUTION | Freq: Four times a day (QID) | ORAL | Status: DC | PRN
  Filled 2018-04-08: qty 5

## 2018-04-08 MED ORDER — LIDOCAINE 2% (20 MG/ML) 5 ML SYRINGE
INTRAMUSCULAR | Status: DC | PRN
  Administered 2018-04-08: 1.5 mg/kg/h via INTRAVENOUS

## 2018-04-08 MED ORDER — NALOXONE HCL 0.4 MG/ML IJ SOLN: 0.4000 mg | INTRAMUSCULAR | Status: DC | PRN

## 2018-04-08 MED ORDER — LIDOCAINE 2% (20 MG/ML) 5 ML SYRINGE
INTRAMUSCULAR | Status: DC | PRN
  Administered 2018-04-08: 60 mg via INTRAVENOUS

## 2018-04-08 MED ORDER — FENTANYL CITRATE (PF) 250 MCG/5ML IJ SOLN
INTRAMUSCULAR | Status: DC | PRN
  Administered 2018-04-08 (×7): 50 ug via INTRAVENOUS

## 2018-04-08 MED ORDER — KETAMINE HCL 10 MG/ML IJ SOLN
INTRAMUSCULAR | Status: AC
  Filled 2018-04-08: qty 1

## 2018-04-08 MED ORDER — FENTANYL CITRATE (PF) 250 MCG/5ML IJ SOLN
INTRAMUSCULAR | Status: AC
  Filled 2018-04-08: qty 5

## 2018-04-08 MED ORDER — ROCURONIUM BROMIDE 10 MG/ML (PF) SYRINGE
PREFILLED_SYRINGE | INTRAVENOUS | Status: DC | PRN
  Administered 2018-04-08: 10 mg via INTRAVENOUS
  Administered 2018-04-08: 70 mg via INTRAVENOUS
  Administered 2018-04-08: 20 mg via INTRAVENOUS

## 2018-04-08 MED ORDER — DIPHENHYDRAMINE HCL 50 MG/ML IJ SOLN: 12.5000 mg | Freq: Four times a day (QID) | INTRAMUSCULAR | Status: DC | PRN

## 2018-04-08 MED ORDER — PROPOFOL 10 MG/ML IV BOLUS
INTRAVENOUS | Status: AC
  Filled 2018-04-08: qty 20

## 2018-04-08 MED ORDER — SUGAMMADEX SODIUM 200 MG/2ML IV SOLN
INTRAVENOUS | Status: DC | PRN
  Administered 2018-04-08: 300 mg via INTRAVENOUS

## 2018-04-08 MED ORDER — ENOXAPARIN SODIUM 40 MG/0.4ML ~~LOC~~ SOLN
40.0000 mg | SUBCUTANEOUS | Status: DC
  Administered 2018-04-09 – 2018-04-11 (×3): 40 mg via SUBCUTANEOUS
  Filled 2018-04-08 (×3): qty 0.4

## 2018-04-08 MED ORDER — SUGAMMADEX SODIUM 200 MG/2ML IV SOLN
INTRAVENOUS | Status: AC
  Filled 2018-04-08: qty 4

## 2018-04-08 SURGICAL SUPPLY — 57 items
APPLIER CLIP 5 13 M/L LIGAMAX5 (MISCELLANEOUS)
APPLIER CLIP ROT 10 11.4 M/L (STAPLE)
BLADE EXTENDED COATED 6.5IN (ELECTRODE) IMPLANT
CABLE HIGH FREQUENCY MONO STRZ (ELECTRODE) ×2 IMPLANT
CELLS DAT CNTRL 66122 CELL SVR (MISCELLANEOUS) IMPLANT
CLIP APPLIE 5 13 M/L LIGAMAX5 (MISCELLANEOUS) IMPLANT
CLIP APPLIE ROT 10 11.4 M/L (STAPLE) IMPLANT
COUNTER NEEDLE 20 DBL MAG RED (NEEDLE) ×1 IMPLANT
COVER MAYO STAND STRL (DRAPES) ×3 IMPLANT
COVER WAND RF STERILE (DRAPES) ×1 IMPLANT
DECANTER SPIKE VIAL GLASS SM (MISCELLANEOUS) ×1 IMPLANT
DERMABOND ADVANCED (GAUZE/BANDAGES/DRESSINGS)
DERMABOND ADVANCED .7 DNX12 (GAUZE/BANDAGES/DRESSINGS) IMPLANT
DRAPE LAPAROSCOPIC ABDOMINAL (DRAPES) ×2 IMPLANT
DRAPE UTILITY XL STRL (DRAPES) ×1 IMPLANT
DRSG OPSITE POSTOP 4X10 (GAUZE/BANDAGES/DRESSINGS) IMPLANT
DRSG OPSITE POSTOP 4X6 (GAUZE/BANDAGES/DRESSINGS) IMPLANT
DRSG OPSITE POSTOP 4X8 (GAUZE/BANDAGES/DRESSINGS) ×1 IMPLANT
ELECT PENCIL ROCKER SW 15FT (MISCELLANEOUS) ×3 IMPLANT
ELECT REM PT RETURN 15FT ADLT (MISCELLANEOUS) ×2 IMPLANT
GAUZE SPONGE 2X2 8PLY STRL LF (GAUZE/BANDAGES/DRESSINGS) IMPLANT
GAUZE SPONGE 4X4 12PLY STRL (GAUZE/BANDAGES/DRESSINGS) IMPLANT
GLOVE BIO SURGEON STRL SZ7.5 (GLOVE) ×4 IMPLANT
GOWN STRL REUS W/TWL XL LVL3 (GOWN DISPOSABLE) ×12 IMPLANT
LEGGING LITHOTOMY PAIR STRL (DRAPES) ×2 IMPLANT
LIGASURE IMPACT 36 18CM CVD LR (INSTRUMENTS) IMPLANT
LUBRICANT JELLY K Y 4OZ (MISCELLANEOUS) ×1 IMPLANT
PACK COLON (CUSTOM PROCEDURE TRAY) ×2 IMPLANT
RELOAD PROXIMATE 75MM BLUE (ENDOMECHANICALS) ×2 IMPLANT
RELOAD STAPLE 75 3.8 BLU REG (ENDOMECHANICALS) IMPLANT
RETRACTOR WND ALEXIS 18 MED (MISCELLANEOUS) IMPLANT
RTRCTR WOUND ALEXIS 18CM MED (MISCELLANEOUS)
SCISSORS LAP 5X35 DISP (ENDOMECHANICALS) ×2 IMPLANT
SET IRRIG TUBING LAPAROSCOPIC (IRRIGATION / IRRIGATOR) ×2 IMPLANT
SHEARS HARMONIC ACE PLUS 36CM (ENDOMECHANICALS) ×2 IMPLANT
SLEEVE XCEL OPT CAN 5 100 (ENDOMECHANICALS) ×4 IMPLANT
SPONGE GAUZE 2X2 STER 10/PKG (GAUZE/BANDAGES/DRESSINGS) ×1
STAPLER PROXIMATE 75MM BLUE (STAPLE) ×1 IMPLANT
STAPLER VISISTAT 35W (STAPLE) IMPLANT
SUT PDS AB 1 CTX 36 (SUTURE) IMPLANT
SUT PDS AB 1 TP1 96 (SUTURE) ×2 IMPLANT
SUT PROLENE 2 0 SH DA (SUTURE) ×2 IMPLANT
SUT SILK 2 0 (SUTURE) ×1
SUT SILK 2 0 SH CR/8 (SUTURE) ×3 IMPLANT
SUT SILK 2-0 18XBRD TIE 12 (SUTURE) ×1 IMPLANT
SUT SILK 3 0 (SUTURE) ×1
SUT SILK 3 0 SH CR/8 (SUTURE) ×2 IMPLANT
SUT SILK 3-0 18XBRD TIE 12 (SUTURE) ×1 IMPLANT
SUT VIC AB 2-0 SH 27 (SUTURE) ×1
SUT VIC AB 2-0 SH 27X BRD (SUTURE) IMPLANT
TAPE CLOTH SURG 4X10 WHT LF (GAUZE/BANDAGES/DRESSINGS) ×1 IMPLANT
TOWEL OR NON WOVEN STRL DISP B (DISPOSABLE) ×1 IMPLANT
TRAY FOLEY MTR SLVR 16FR STAT (SET/KITS/TRAYS/PACK) ×1 IMPLANT
TROCAR BLADELESS OPT 5 100 (ENDOMECHANICALS) ×2 IMPLANT
TROCAR XCEL NON-BLD 11X100MML (ENDOMECHANICALS) IMPLANT
TUBING CONNECTING 10 (TUBING) ×2 IMPLANT
TUBING INSUF HEATED (TUBING) ×2 IMPLANT

## 2018-04-08 NOTE — Anesthesia Procedure Notes (Signed)
Procedure Name: Intubation Date/Time: 04/08/2018 10:50 AM Performed by: Mitzie Na, CRNA Pre-anesthesia Checklist: Patient identified, Emergency Drugs available, Suction available, Patient being monitored and Timeout performed Patient Re-evaluated:Patient Re-evaluated prior to induction Oxygen Delivery Method: Circle system utilized Preoxygenation: Pre-oxygenation with 100% oxygen Induction Type: IV induction Ventilation: Mask ventilation without difficulty and Oral airway inserted - appropriate to patient size Laryngoscope Size: Mac and 4 Grade View: Grade I Tube type: Oral Tube size: 7.5 mm Number of attempts: 1 Airway Equipment and Method: Stylet Placement Confirmation: ETT inserted through vocal cords under direct vision,  positive ETCO2 and breath sounds checked- equal and bilateral Secured at: 24 cm Tube secured with: Tape Dental Injury: Teeth and Oropharynx as per pre-operative assessment

## 2018-04-08 NOTE — Interval H&P Note (Signed)
History and Physical Interval Note:  04/08/2018 9:56 AM  Charles Rowland  has presented today for surgery, with the diagnosis of RECTAL MASS  The various methods of treatment have been discussed with the patient and family. After consideration of risks, benefits and other options for treatment, the patient has consented to  Procedure(s): LAPAROSCOPIC ASSISTED LOW ANTERIOR RESECTION (N/A) as a surgical intervention .  The patient's history has been reviewed, patient examined, no change in status, stable for surgery.  I have reviewed the patient's chart and labs.  Questions were answered to the patient's satisfaction.     Autumn Messing III

## 2018-04-08 NOTE — Op Note (Addendum)
04/04/2018 - 04/08/2018  12:42 PM  PATIENT:  Charles Rowland  42 y.o. male  PRE-OPERATIVE DIAGNOSIS:  RECTOSIGMOID CANCER  POST-OPERATIVE DIAGNOSIS:  RECTOSIGMOID CANCER  PROCEDURE:  Procedure(s): LAPAROSCOPIC ASSISTED SIGMOID COLECTOMY Rigid sigmoidoscopy  SURGEON:  Surgeon(s) and Role:    * Jovita Kussmaul, MD - Primary    * Coralie Keens, MD - Assisting  PHYSICIAN ASSISTANT:   ASSISTANTS: Dr. Ninfa Linden   ANESTHESIA:   general  EBL:  150 mL   BLOOD ADMINISTERED:none  DRAINS: none   LOCAL MEDICATIONS USED:  MARCAINE     SPECIMEN:  Source of Specimen:  sigmoid colon and donut margins  DISPOSITION OF SPECIMEN:  PATHOLOGY  COUNTS:  YES  TOURNIQUET:  * No tourniquets in log *  DICTATION: .Dragon Dictation   After informed consent was obtained the patient was brought to the operating room and placed in the supine position on the operating table.  After adequate induction of general anesthesia the patient was moved into lithotomy position and all pressure points were padded.  The abdomen was then prepped with ChloraPrep, allowed to dry, and draped in usual sterile Rowland and the perineum was prepped with Betadine.  A site was chosen in the right upper quadrant to access the abdominal cavity.  This area was infiltrated with quarter percent Marcaine.  A small stab incision was made with a 15 blade knife.  A 5 mm Optiview port and camera were used to bluntly dissected the layers of the abdominal wall under direct vision until access was gained to the abdominal cavity.  The abdomen was then insufflated with carbon dioxide without difficulty.  The patient was placed in Trendelenburg position and rotated slightly with the left side up.  2 more 5 mm ports were placed on the right side of the abdomen under direct vision.  The cancer was readily identified at the distal sigmoid colon.  The left and sigmoid colon were then mobilized by incising the retroperitoneal attachment along the  white line of T[place]t using the harmonic scalpel.  There were no other abnormalities noted on general inspection of the rest of the abdominal cavity.  Once there appeared to be good mobilization then we made a small lower midline incision with a 10 blade knife.  The incision was carried through the skin and subcutaneous tissue sharply with electrocautery until the linea alba was identified.  The linea alba was also incised with the electrocautery.  The preperitoneal space was probed bluntly with a hemostat until the peritoneum was opened and access was gained to the abdominal cavity.  The rest of the incision was opened under direct vision.  An [place] wound protector was deployed.  The sigmoid colon with the cancer was easily brought out of the abdominal cavity through the incision.  A site was chosen well above and below the area of the palpable cancer where the colon and rectum appeared normal.  The distal margin was taken down onto the rectum.  The rectum and colon were then divided at each of these points with firing of a GIA-75 stapler.  The mesentery to the sigmoid colon was then taken down sharply with the harmonic scalpel.  The main vessel supplying this area were suture-ligated with 2-0 silk stitches.  The ureter was identified and was well away from the dissection area.  Once this was accomplished the rectosigmoid colon with the cancer was removed and was marked with a stitch on the distal staple line.  It was sent to pathology for  further evaluation.  Next the staple line on the proximal segment was removed sharply with electrocautery.  The lumen was sized with the EEA sizers and I chose a 29 mm stapler.  A 2-0 Prolene pursestring stitch was placed around the edge of the proximal colon.  The anvil was placed in the lumen and the pursestring stitch was cinched down around the stem of the anvil.  Next Dr. Ninfa Linden went below and performed a rigid sigmoidoscopy.  He was able to bring a 29 EEA stapler up  through the rectum.  It reached the distal staple line easily.  The spike was brought out along the anterior wall of the rectum.  The spike and anvil were attached to each other and the stapling device was closed into the green zone.  A minute was allowed to pass and then the stapler was fired.  The stapler was then removed without difficulty.  The staple line was reinforced with multiple interrupted 2-0 silk stitches.  The pelvis was then filled with saline.  A soft bowel clamp was placed above the anastomosis.  The rectum was then insufflated and there was good distention of the rectosigmoid area.  There were no bubbles identified.  The wound was then irrigated with copious amounts of saline and the insufflation was removed.  Next all gowns and gloves and new drapes were changed.  The peritoneum was then closed with a running 2-0 Vicryl stitch.  The fascia of the abdominal wall was closed with 2 running #1 double-stranded loop PDS sutures.  The subcutaneous tissue was irrigated with copious amounts of saline and the skin incisions were closed with staples.  Sterile dressings were applied.  The patient tolerated the procedure well.  At the end of the case all needle sponge and instrument counts were correct.  The patient was then awakened and taken to recovery in stable condition.  The assistant was instrumental in the visualization and performance of the case.  PLAN OF CARE: Admit to inpatient   PATIENT DISPOSITION:  PACU - hemodynamically stable.   Delay start of Pharmacological VTE agent (>24hrs) due to surgical blood loss or risk of bleeding: no

## 2018-04-08 NOTE — Progress Notes (Signed)
PROGRESS NOTE    Charles Rowland  EXH:[id number] DOB: [birth year] DOA: 04/04/2018 PCP: Dineen Kid, MD      Brief Narrative:  Charles Rowland is a 42 y.o. M with no significant PMHx who presents with several days of fairly heavy bright red blood per rectum.  In ER, Hgb was down to 8.2 g/dL from 11 three days prior, and patient had bright red bloody BM.  Admitted and planned for colonoscopy.  Colonoscopy subsequently found 5cm villous adenoma, biopsy proven cancer.     Assessment & Plan:  Colorectal cancer Biopsy showed adenocarcinoma.   CT C/A/P shows no mets.  CEA low.   -To OR for LAR today -Oncology follow up to be coordinated by General Surgery and Gastroenterology    Acute blood loss anemia Transfused 2 units and Hgb stable since.  No further bloody BM.           MDM and disposition: The below labs and imaging reports were reviewed and summarized above.  Medication management as above.  The patient was admitted with acute blood loss anemia found to have colorectal cancer.  Colectomy today.  We will sign off at this time, per my discussion with Dr. Marlou Starks, who has graciously accepted patient onto [facility] service.  If the patient were to develop any medical complications post-operatively, we will be happy to see him again.       DVT prophylaxis: SCDs Code Status: FULL Family Communication: None present    Consultants:   Generl Surgery  Gastroenterology  Procedures:   Colonoscopy Colonoscopy showed a frond-like/villous, infiltrative and polypoid partially obstructing large mass highly suspicious for malignancy, about 5 cm in size, 20 to 25 cm proximal to the anus.  The mass was partially circumferential involving two thirds of the luminal circumference.  Biopsies were taken with hot snare and the area was tattooed with 5 mL of [place] ink at the proximal and distal margins.  A 6 mm polyp was noted in transverse colon, removed by snare polypectomy. Small internal  hemorrhoids were noted.   Recommendations: Clear liquid diet. Await pathology results. Surgical evaluation for resection of mass. CT of chest/abdomen/pelvis with contrast to rule out metastases. CEA level.   Antimicrobials:   None    Subjective: Some flatulence yesterday, no blood BM at all.  No fever, abdominal pain, confusion, syncope.    Objective: Vitals:   04/07/18 1400 04/07/18 2157 04/08/18 0650 04/08/18 0940  BP: (!) 139/91 (!) 137/104 123/85 (!) 142/99  Pulse: (!) 106 91 90 87  Resp:  16 18 18   Temp:  98.2 F (36.8 C) 98 F (36.7 C) 98.4 F (36.9 C)  TempSrc:  Oral Oral Oral  SpO2:  98% 98% 98%    Intake/Output Summary (Last 24 hours) at 04/08/2018 0955 Last data filed at 04/08/2018 0857 Gross per 24 hour  Intake 1459.56 ml  Output 3150 ml  Net -1690.44 ml   There were no vitals filed for this visit.  Examination: General appearance: Adult male, sitting on edge of bed, no acute distress, interactive.   HEENT: Anicteric, conjunctival pink, lids and lashes normal.  No nasal deformity, discharge, or epistaxis. Skin: Skin warm and dry, no jaundice, no suspicious rashes or lesions Cardiac: Regular rate and rhythm, no murmurs appreciated.    Respiratory: Normal respiratory rate and rhythm, lungs clear without rales or wheezes. Abdomen: Soft without tenderness to palpation voluntary guarding no distention or hepatospleno megaly.   MSK: No deformities or effusions. Neuro: Awake and alert, moves  all extremities with equal strength and coordination, speech fluent.    Psych: Sensorium intact and responding to questions, attention normal, affect normal.  Judgment and insight appear normal.   Data Reviewed: I have personally reviewed following labs and imaging studies:  CBC: Recent Labs  Lab 04/05/18 1819 04/06/18 0045 04/06/18 1007 04/07/18 0334 04/08/18 0450  WBC 5.4 5.9 5.5 8.1 4.5  HGB 9.4* 8.8* 9.4* 9.1* 8.9*  HCT 29.5* 27.4* 29.6* 28.9* 28.4*  MCV  84.8 83.5 84.1 85.0 85.5  PLT 189 189 198 208 518   Basic Metabolic Panel: Recent Labs  Lab 04/04/18 1955 04/04/18 1959 04/05/18 0613 04/08/18 0450  NA 135 138 137 140  K 3.4* 3.5 3.8 3.6  CL 103 103 109 108  CO2 23  --  23 25  GLUCOSE 123* 121* 95 116*  BUN 15 13 14 9   CREATININE 1.17 1.20 1.09 1.16  CALCIUM 8.5*  --  8.2* 8.8*  MG  --   --  2.0  --   PHOS  --   --  2.8  --    GFR: CrCl cannot be calculated (Unknown ideal weight.). Liver Function Tests: Recent Labs  Lab 04/04/18 1955 04/05/18 0613  AST 18 15  ALT 14 13  ALKPHOS 47 42  BILITOT 0.4 0.6  PROT 6.8 6.0*  ALBUMIN 3.9 3.4*   No results for input(s): LIPASE, AMYLASE in the last 168 hours. No results for input(s): AMMONIA in the last 168 hours. Coagulation Profile: No results for input(s): INR, PROTIME in the last 168 hours. Cardiac Enzymes: Recent Labs  Lab 04/04/18 1950 04/05/18 0008 04/05/18 0613 04/05/18 1200  TROPONINI <0.03 <0.03 <0.03 <0.03   BNP (last 3 results) No results for input(s): PROBNP in the last 8760 hours. HbA1C: Recent Labs    04/07/18 2023  HGBA1C 4.9   CBG: No results for input(s): GLUCAP in the last 168 hours. Lipid Profile: No results for input(s): CHOL, HDL, LDLCALC, TRIG, CHOLHDL, LDLDIRECT in the last 72 hours. Thyroid Function Tests: No results for input(s): TSH, T4TOTAL, FREET4, T3FREE, THYROIDAB in the last 72 hours. Anemia Panel: Recent Labs    04/05/18 1201  VITAMINB12 225  FOLATE 15.1  FERRITIN 6*  TIBC 367  IRON 21*   Urine analysis: No results found for: COLORURINE, APPEARANCEUR, LABSPEC, PHURINE, GLUCOSEU, HGBUR, BILIRUBINUR, KETONESUR, PROTEINUR, UROBILINOGEN, NITRITE, LEUKOCYTESUR Sepsis Labs: @LABRCNTIP (procalcitonin:4,lacticacidven:4)  ) Recent Results (from the past 240 hour(s))  MRSA PCR Screening     Status: None   Collection Time: 04/07/18  4:40 PM  Result Value Ref Range Status   MRSA by PCR NEGATIVE NEGATIVE Final    Comment:         The GeneXpert MRSA Assay (FDA approved for NASAL specimens only), is one component of a comprehensive MRSA colonization surveillance program. It is not intended to diagnose MRSA infection nor to guide or monitor treatment for MRSA infections. Performed at [facility], [place] [address] [place], [place] [address]          Radiology Studies: Ct Chest W Contrast  Result Date: 04/06/2018 CLINICAL DATA:  42 year old male with newly diagnosed rectal mass at colonoscopy today. Evaluate for metastatic disease. EXAM: CT CHEST, ABDOMEN, AND PELVIS WITH CONTRAST TECHNIQUE: Multidetector CT imaging of the chest, abdomen and pelvis was performed following the standard protocol during bolus administration of intravenous contrast. CONTRAST:  165mL OMNIPAQUE IOHEXOL 300 MG/ML  SOLN COMPARISON:  None. FINDINGS: CT CHEST FINDINGS Cardiovascular: No significant vascular findings. Normal  heart size. No pericardial effusion. Mediastinum/Nodes: No enlarged mediastinal, hilar, or axillary lymph nodes. Thyroid gland, trachea, and esophagus demonstrate no significant findings. Lungs/Pleura: Lungs are clear. No pulmonary nodules or masses. No pleural effusion or pneumothorax. Musculoskeletal: No chest wall mass or suspicious bone lesions identified. CT ABDOMEN PELVIS FINDINGS Hepatobiliary: The liver and gallbladder are unremarkable. No focal hepatic lesions identified. No biliary dilatation. Pancreas: Unremarkable Spleen: Unremarkable Adrenals/Urinary Tract: The kidneys, adrenal glands and bladder are unremarkable except for bilateral renal cysts. Stomach/Bowel: Apparent circumferential wall thickening of the sigmoid colon (series 2: Image [id number]) may represent this patient's known colonic mass. No adjacent enlarged lymph nodes. No other bowel abnormalities are identified. Vascular/Lymphatic: No significant vascular findings are present. No enlarged abdominal or pelvic lymph nodes. Reproductive:  Prostate is unremarkable. Other: No ascites, pneumoperitoneum, focal collection or peritoneal/omental nodularity. Musculoskeletal: No acute or suspicious bony abnormalities. IMPRESSION: 1. Apparent circumferential wall thickening of the sigmoid colon which may represent this patient's known colonic mass. No evidence of enlarged lymph nodes or metastatic disease. 2. No other significant abnormalities. Electronically Signed   By: Margarette Canada M.D.   On: 04/06/2018 20:24   Ct Abdomen Pelvis W Contrast  Result Date: 04/06/2018 CLINICAL DATA:  42 year old male with newly diagnosed rectal mass at colonoscopy today. Evaluate for metastatic disease. EXAM: CT CHEST, ABDOMEN, AND PELVIS WITH CONTRAST TECHNIQUE: Multidetector CT imaging of the chest, abdomen and pelvis was performed following the standard protocol during bolus administration of intravenous contrast. CONTRAST:  149mL OMNIPAQUE IOHEXOL 300 MG/ML  SOLN COMPARISON:  None. FINDINGS: CT CHEST FINDINGS Cardiovascular: No significant vascular findings. Normal heart size. No pericardial effusion. Mediastinum/Nodes: No enlarged mediastinal, hilar, or axillary lymph nodes. Thyroid gland, trachea, and esophagus demonstrate no significant findings. Lungs/Pleura: Lungs are clear. No pulmonary nodules or masses. No pleural effusion or pneumothorax. Musculoskeletal: No chest wall mass or suspicious bone lesions identified. CT ABDOMEN PELVIS FINDINGS Hepatobiliary: The liver and gallbladder are unremarkable. No focal hepatic lesions identified. No biliary dilatation. Pancreas: Unremarkable Spleen: Unremarkable Adrenals/Urinary Tract: The kidneys, adrenal glands and bladder are unremarkable except for bilateral renal cysts. Stomach/Bowel: Apparent circumferential wall thickening of the sigmoid colon (series 2: Image [id number]) may represent this patient's known colonic mass. No adjacent enlarged lymph nodes. No other bowel abnormalities are identified. Vascular/Lymphatic: No  significant vascular findings are present. No enlarged abdominal or pelvic lymph nodes. Reproductive: Prostate is unremarkable. Other: No ascites, pneumoperitoneum, focal collection or peritoneal/omental nodularity. Musculoskeletal: No acute or suspicious bony abnormalities. IMPRESSION: 1. Apparent circumferential wall thickening of the sigmoid colon which may represent this patient's known colonic mass. No evidence of enlarged lymph nodes or metastatic disease. 2. No other significant abnormalities. Electronically Signed   By: Margarette Canada M.D.   On: 04/06/2018 20:24        Scheduled Meds: . [MAR Hold] sodium chloride   Intravenous Once  . Chlorhexidine Gluconate Cloth  6 each Topical Once   Continuous Infusions: . cefoTEtan (CEFOTAN) IV    . dextrose 5 % and 0.9 % NaCl with KCl 20 mEq/L 100 mL/hr at 04/07/18 2221  . lactated ringers       LOS: 4 days    Time spent: 25 minutes    Edwin Dada, MD [facility] 04/08/2018, 9:55 AM     Pager [phone] --- please page though AMION:  [email or web address] Password [email or web address] If 7PM-7AM, please contact night-coverage

## 2018-04-08 NOTE — Anesthesia Preprocedure Evaluation (Signed)
Anesthesia Evaluation  Patient identified by MRN, date of birth, ID band Patient awake  General Assessment Comment:Rectal Ca  Reviewed: Allergy & Precautions, NPO status , Patient's Chart, lab work & pertinent test results  Airway Mallampati: II  TM Distance: >3 FB Neck ROM: Full    Dental no notable dental hx.    Pulmonary neg pulmonary ROS,    Pulmonary exam normal breath sounds clear to auscultation       Cardiovascular negative cardio ROS Normal cardiovascular exam Rhythm:Regular Rate:Normal     Neuro/Psych negative neurological ROS  negative psych ROS   GI/Hepatic negative GI ROS, Neg liver ROS,   Endo/Other  negative endocrine ROS  Renal/GU negative Renal ROS  negative genitourinary   Musculoskeletal negative musculoskeletal ROS (+)   Abdominal   Peds negative pediatric ROS (+)  Hematology  (+) anemia ,   Anesthesia Other Findings   Reproductive/Obstetrics negative OB ROS                             Anesthesia Physical Anesthesia Plan  ASA: III  Anesthesia Plan: General   Post-op Pain Management:    Induction: Intravenous  PONV Risk Score and Plan: 2 and Ondansetron, Dexamethasone and Treatment may vary due to age or medical condition  Airway Management Planned: Oral ETT  Additional Equipment:   Intra-op Plan:   Post-operative Plan: Extubation in OR  Informed Consent: I have reviewed the patients History and Physical, chart, labs and discussed the procedure including the risks, benefits and alternatives for the proposed anesthesia with the patient or authorized representative who has indicated his/her understanding and acceptance.   Dental advisory given  Plan Discussed with: CRNA and Surgeon  Anesthesia Plan Comments:         Anesthesia Quick Evaluation

## 2018-04-08 NOTE — Anesthesia Postprocedure Evaluation (Signed)
Anesthesia Post Note  Patient: Charles Rowland  Procedure(s) Performed: LAPAROSCOPIC ASSISTED LOW ANTERIOR RESECTION (N/A Abdomen)     Patient location during evaluation: PACU Anesthesia Type: General Level of consciousness: awake and alert Pain management: pain level controlled Vital Signs Assessment: post-procedure vital signs reviewed and stable Respiratory status: spontaneous breathing, nonlabored ventilation, respiratory function stable and patient connected to nasal cannula oxygen Cardiovascular status: blood pressure returned to baseline and stable Postop Assessment: no apparent nausea or vomiting Anesthetic complications: no    Last Vitals:  Vitals:   04/08/18 1425 04/08/18 1455  BP:  (!) 149/89  Pulse:  (!) 105  Resp: 12 16  Temp:  36.9 C  SpO2: 95%     Last Pain:  Vitals:   04/08/18 1455  TempSrc: Oral  PainSc:                  Jerrion Tabbert S

## 2018-04-08 NOTE — Transfer of Care (Signed)
Immediate Anesthesia Transfer of Care Note  Patient: Charles Rowland  Procedure(s) Performed: LAPAROSCOPIC ASSISTED LOW ANTERIOR RESECTION (N/A Abdomen)  Patient Location: PACU  Anesthesia Type:General  Level of Consciousness: awake, alert , oriented and patient cooperative  Airway & Oxygen Therapy: Patient Spontanous Breathing and Patient connected to face mask oxygen  Post-op Assessment: Report given to RN, Post -op Vital signs reviewed and stable and Patient moving all extremities  Post vital signs: Reviewed and stable  Last Vitals:  Vitals Value Taken Time  BP 165/108 04/08/2018  1:00 PM  Temp    Pulse 94 04/08/2018  1:00 PM  Resp 9 04/08/2018  1:00 PM  SpO2 100 % 04/08/2018  1:00 PM  Vitals shown include unvalidated device data.  Last Pain:  Vitals:   04/08/18 0940  TempSrc: Oral  PainSc:       Patients Stated Pain Goal: 2 (54/86/28 2417)  Complications: No apparent anesthesia complications

## 2018-04-09 ENCOUNTER — Encounter (HOSPITAL_COMMUNITY): Payer: Self-pay | Admitting: General Surgery

## 2018-04-09 LAB — BASIC METABOLIC PANEL
Anion gap: 9 (ref 5–15)
BUN: 11 mg/dL (ref 6–20)
CHLORIDE: 106 mmol/L (ref 98–111)
CO2: 23 mmol/L (ref 22–32)
CREATININE: 1.01 mg/dL (ref 0.61–1.24)
Calcium: 9.1 mg/dL (ref 8.9–10.3)
GFR calc non Af Amer: >60 mL/min (ref >60)
Glucose, Bld: 120 mg/dL — ABNORMAL HIGH (ref 70–99)
POTASSIUM: 3.8 mmol/L (ref 3.5–5.1)
SODIUM: 138 mmol/L (ref 135–145)

## 2018-04-09 LAB — CBC
HCT: 31.4 % — ABNORMAL LOW (ref 39.0–52.0)
HEMOGLOBIN: 9.6 g/dL — AB (ref 13.0–17.0)
MCH: 27.5 pg (ref 26.0–34.0)
MCHC: 30.6 g/dL (ref 30.0–36.0)
MCV: 90 fL (ref 80.0–100.0)
PLATELETS: 249 10*3/uL (ref 150–400)
RBC: 3.49 MIL/uL — AB (ref 4.22–5.81)
RDW: 14.3 % (ref 11.5–15.5)
WBC: 10.2 10*3/uL (ref 4.0–10.5)
nRBC: 0 % (ref 0.0–0.2)

## 2018-04-09 MED ORDER — OXYCODONE HCL 5 MG PO TABS
5.0000 mg | ORAL_TABLET | ORAL | Status: DC | PRN
  Administered 2018-04-09 – 2018-04-10 (×3): 5 mg via ORAL
  Filled 2018-04-09 (×3): qty 1

## 2018-04-09 MED ORDER — HYDROMORPHONE HCL 1 MG/ML IJ SOLN: 1.0000 mg | INTRAMUSCULAR | Status: DC | PRN

## 2018-04-09 MED ORDER — ACETAMINOPHEN 500 MG PO TABS
1000.0000 mg | ORAL_TABLET | Freq: Three times a day (TID) | ORAL | Status: DC
  Administered 2018-04-09 – 2018-04-11 (×7): 1000 mg via ORAL
  Filled 2018-04-09 (×7): qty 2

## 2018-04-09 MED ORDER — IBUPROFEN 200 MG PO TABS
400.0000 mg | ORAL_TABLET | Freq: Three times a day (TID) | ORAL | Status: DC
  Administered 2018-04-09 – 2018-04-11 (×7): 400 mg via ORAL
  Filled 2018-04-09 (×7): qty 2

## 2018-04-09 MED ORDER — FAMOTIDINE 20 MG PO TABS
20.0000 mg | ORAL_TABLET | Freq: Two times a day (BID) | ORAL | Status: DC
  Administered 2018-04-09 – 2018-04-11 (×5): 20 mg via ORAL
  Filled 2018-04-09 (×5): qty 1

## 2018-04-09 NOTE — Progress Notes (Signed)
Foley d c'd. Obtained 54mls of saline with cateter tip intact. Patient tolerated moderately well. Drainage bag noted to have clear yellow urine. Wife present at bedside. Pt instructed on importance of voiding post catheter to removal. Post void due at 1845. Will continue to monitor.

## 2018-04-09 NOTE — Progress Notes (Signed)
I have reviewed and concur with this student's documentation.   

## 2018-04-09 NOTE — Progress Notes (Signed)
[facility] Progress Note  1 Day Post-Op  Subjective: Patient doing well overall this morning. He reports pain at 5-6/10, but says morphine has had no effect and requests to try something else. He didn't sleep well last night, but says this was mostly due to worrying about rolling onto the catheter. He denies passing gas or having a BM, but says his stomach has been "rumbling." He walked several laps around the hallway yesterday and has spent an hour in the chair this morning. He is requesting to have his catheter removed. He denies NV.  Objective: Vital signs in last 24 hours: Temp:  [97.9 F (36.6 C)-98.8 F (37.1 C)] 98 F (36.7 C) (11/07 0900) Pulse Rate:  [84-129] 100 (11/07 0900) Resp:  [11-20] 17 (11/07 0900) BP: (141-171)/(75-117) 141/92 (11/07 0900) SpO2:  [95 %-100 %] 97 % (11/07 0900) Weight:  [88.7 kg] 88.7 kg (11/06 1025) Last BM Date: 04/06/18  Intake/Output from previous day: 11/06 0701 - 11/07 0700 In: 2280.1 [P.O.:120; I.V.:2060.1; IV Piggyback:100] Out: 1915 [Urine:1765; Blood:150] Intake/Output this shift: No intake/output data recorded.  PE: Gen:  Alert, NAD, pleasant, lying in bed Card:  Regular rate and rhythm Pulm:  Normal effort, clear to auscultation bilaterally Abd: Soft, appropriately tender, non-distended, bowel sounds present in all 4 quadrants, incisions are all C/D/I Skin: warm and dry, no rashes  Psych: A&Ox3   Lab Results:  Recent Labs    04/08/18 0450 04/09/18 0534  WBC 4.5 10.2  HGB 8.9* 9.6*  HCT 28.4* 31.4*  PLT 231 249   BMET Recent Labs    04/08/18 0450 04/09/18 0534  NA 140 138  K 3.6 3.8  CL 108 106  CO2 25 23  GLUCOSE 116* 120*  BUN 9 11  CREATININE 1.16 1.01  CALCIUM 8.8* 9.1   PT/INR No results for input(s): LABPROT, INR in the last 72 hours. CMP     Component Value Date/Time   NA 138 04/09/2018 0534   K 3.8 04/09/2018 0534   CL 106 04/09/2018 0534   CO2 23 04/09/2018 0534   GLUCOSE 120 (H)  04/09/2018 0534   BUN 11 04/09/2018 0534   CREATININE 1.01 04/09/2018 0534   CALCIUM 9.1 04/09/2018 0534   PROT 6.0 (L) 04/05/2018 0613   ALBUMIN 3.4 (L) 04/05/2018 0613   AST 15 04/05/2018 0613   ALT 13 04/05/2018 0613   ALKPHOS 42 04/05/2018 0613   BILITOT 0.6 04/05/2018 0613   GFRNONAA >60 04/09/2018 0534   GFRAA >60 04/09/2018 0534   Lipase  No results found for: LIPASE     Studies/Results: No results found.  Anti-infectives: Anti-infectives (From admission, onward)   Start     Dose/Rate Route Frequency Ordered Stop   04/08/18 0600  cefoTEtan (CEFOTAN) 2 g in sodium chloride 0.9 % 100 mL IVPB     2 g 200 mL/hr over 30 Minutes Intravenous On call to O.R. 04/07/18 1955 04/08/18 1052       Assessment/Plan 1. S/p laparoscopic sigmoid colectomy for rectosigmoid cancer on 04/08/2018- Dr. Marlou Starks Patient appears well this morning. He is lying comfortably in bed. He ambulates without issue and has been walking laps in the hallways. He is appropriately tender on exam and his incisions are clean, dry and intact. Although not passing gas/BM yet, bowel sounds are present on exam.  -Labs are stable, WBC normal and Hbg/Hct have improved. (Hgb: 9.6, Hct: 31.4)  -Pt approved to advance to clear liquid diet. I have encouraged him to go  slowly at first.  -Decrease IVF to 50   -Good UOP (1,795)--D/C foley  -D/C morphine. Ordered scheduled Tylenol and Ibuprofen, with Dilaudid PRN for break through pain.  -D/C nasal cannula   -I encouraged him to continue ambulating as tolerated.    LOS: 5 days   Vita Barley, PA-S

## 2018-04-10 NOTE — Progress Notes (Signed)
[facility] Progress Note  2 Days Post-Op  Subjective: CC:  Pain controlled. +flatus. Denies nuasea/voimiting   Objective: Vital signs in last 24 hours: Temp:  [97.5 F (36.4 C)-98.7 F (37.1 C)] 97.5 F (36.4 C) (11/08 0637) Pulse Rate:  [84-98] 84 (11/08 0637) Resp:  [18] 18 (11/08 0637) BP: (139-164)/(89-94) 139/89 (11/08 0637) SpO2:  [97 %-100 %] 97 % (11/08 0637) Last BM Date: 04/06/18  Intake/Output from previous day: 11/07 0701 - 11/08 0700 In: 2305 [P.O.:840; I.V.:1165] Out: 4400 [Urine:4400] Intake/Output this shift: Total I/O In: 120 [P.O.:120] Out: 575 [Urine:575]  PE: Gen:  Alert, NAD, pleasant Card:  Regular rate and rhythm, pedal pulses 2+ BL Pulm:  Normal effort, clear to auscultation bilaterally Abd: Soft, approp tender, +BS, non-distended,  incisions C/D/I Skin: warm and dry, no rashes  Psych: A&Ox3   Lab Results:  Recent Labs    04/08/18 0450 04/09/18 0534  WBC 4.5 10.2  HGB 8.9* 9.6*  HCT 28.4* 31.4*  PLT 231 249   BMET Recent Labs    04/08/18 0450 04/09/18 0534  NA 140 138  K 3.6 3.8  CL 108 106  CO2 25 23  GLUCOSE 116* 120*  BUN 9 11  CREATININE 1.16 1.01  CALCIUM 8.8* 9.1   PT/INR No results for input(s): LABPROT, INR in the last 72 hours. CMP     Component Value Date/Time   NA 138 04/09/2018 0534   K 3.8 04/09/2018 0534   CL 106 04/09/2018 0534   CO2 23 04/09/2018 0534   GLUCOSE 120 (H) 04/09/2018 0534   BUN 11 04/09/2018 0534   CREATININE 1.01 04/09/2018 0534   CALCIUM 9.1 04/09/2018 0534   PROT 6.0 (L) 04/05/2018 0613   ALBUMIN 3.4 (L) 04/05/2018 0613   AST 15 04/05/2018 0613   ALT 13 04/05/2018 0613   ALKPHOS 42 04/05/2018 0613   BILITOT 0.6 04/05/2018 0613   GFRNONAA >60 04/09/2018 0534   GFRAA >60 04/09/2018 0534   Lipase  No results found for: LIPASE     Studies/Results: No results found.  Anti-infectives: Anti-infectives (From admission, onward)   Start     Dose/Rate Route  Frequency Ordered Stop   04/08/18 0600  cefoTEtan (CEFOTAN) 2 g in sodium chloride 0.9 % 100 mL IVPB     2 g 200 mL/hr over 30 Minutes Intravenous On call to O.R. 04/07/18 1955 04/08/18 1052       Assessment/Plan adenocarcinoma of sigmoid colon  S/p laparoscopic sigmoid colectomy for rectosigmoid cancer on 04/08/2018- Dr. Marlou Starks             - afebrile, VSS, WBC 10, BMET WNL 11/7  - follow path, CEA WNL, CT chest/abd/pelvis do not suggests metastatic disease             -clear liquids              -Decrease IVF to 50              -scheduled Tylenol and Ibuprofen, PRN oxy, Dilaudid PRN for break through pain.             - OOB and mobilize in hall 4-5x daily   FEN: Clears ID: perioperative cefotetan 11/6 Foley: D/C-ed 11/7   LOS: 6 days    Obie Dredge, [facility] Surgery Pager: [phone]

## 2018-04-11 MED ORDER — OXYCODONE HCL 5 MG PO TABS: 5.0000 mg | ORAL_TABLET | Freq: Four times a day (QID) | ORAL | 0 refills | Status: DC | PRN

## 2018-04-11 NOTE — Discharge Summary (Addendum)
Physician Discharge Summary  Patient ID: Charles Rowland MRN: [id number] DOB/AGE: 40-Jun-1977 40 y.o.  Admit date: 04/04/2018 Discharge date: 04/11/2018  Admission Diagnoses: rectal bleeding  Discharge Diagnoses:  Active Problems:   Bright red blood per rectum   Lower GI bleed   Rectal bleed Colon cancer  Discharged Condition: good  Hospital Course: Pt admitted for rectal bleeding.  He was noted to have a distal sigmoid mass.  He underwent a laparoscopic resection.  Afterwards his diet was advanced as tolerated.  He was discharged to home once tolerating a diet, having bowel function and ambulating well.    Consults: GI  Significant Diagnostic Studies: labs: cbc, bmet  Treatments: analgesia: oxycodone and surgery: see above  Discharge Exam: Blood pressure 124/81, pulse 74, temperature 98 F (36.7 C), temperature source Oral, resp. rate 18, height 5\' 10"  (1.778 m), weight 91.5 kg, SpO2 100 %. General appearance: alert and cooperative GI: normal findings: soft, non-tender Incision/Wound: clean, dry, intact  Disposition: home   Allergies as of 04/11/2018   No Known Allergies     Medication List    TAKE these medications   hydrocortisone 2.5 % cream Apply 1 application topically as needed for irritation.   ibuprofen 200 MG tablet Commonly known as:  ADVIL,MOTRIN Take 400 mg by mouth every 6 (six) hours as needed for moderate pain.   oxyCODONE 5 MG immediate release tablet Commonly known as:  Oxy IR/ROXICODONE Take 1-2 tablets (5-10 mg total) by mouth every 6 (six) hours as needed for moderate pain or severe pain (5 mod, 10 severe).   PROBIOTIC ACIDOPHILUS PO Take 1 tablet by mouth daily.   psyllium 0.52 g capsule Commonly known as:  REGULOID Take 0.52 g by mouth daily.      Follow-up Information    Jovita Kussmaul, MD. Go on 04/21/2018.   Specialty:  General Surgery Why:  Appointment scheduled for 8:45 AM. Please arrive 30 min prior to appointment time. Bring  photo ID and insurance information.  Contact information: [address] [place] [address] [phone]          I have reviewed the [place] for this patient.  There are no other prescriptions noted for this patient.  Signed: Elisabel Hanover C. 78/11/7670, 09:47 AM

## 2018-04-11 NOTE — Discharge Instructions (Signed)
[facility]      [facility] [phone]  OPEN ABDOMINAL SURGERY: POST OP INSTRUCTIONS  Always review your discharge instruction sheet given to you by the facility where your surgery was performed.  IF YOU HAVE DISABILITY OR FAMILY LEAVE FORMS, YOU MUST BRING THEM TO THE OFFICE FOR PROCESSING.  PLEASE DO NOT GIVE THEM TO YOUR DOCTOR.  1. A prescription for pain medication may be given to you upon discharge.  Take your pain medication as prescribed, if needed.  If narcotic pain medicine is not needed, then you may take acetaminophen (Tylenol) or ibuprofen (Advil) as needed. 2. Take your usually prescribed medications unless otherwise directed. 3. If you need a refill on your pain medication, please contact your pharmacy. They will contact our office to request authorization.  Prescriptions will not be filled after 5pm or on week-ends. 4. You should follow a light diet the first few days after arrival home, such as soup and crackers, pudding, etc.unless your doctor has advised otherwise. A high-fiber, low fat diet can be resumed as tolerated.   Be sure to include lots of fluids daily. Most patients will experience some swelling and bruising on the chest and neck area.  Ice packs will help.  Swelling and bruising can take several days to resolve 5. Most patients will experience some swelling and bruising in the area of the incision. Ice pack will help. Swelling and bruising can take several days to resolve..  6. It is common to experience some constipation if taking pain medication after surgery.  Increasing fluid intake and taking a stool softener will usually help or prevent this problem from occurring.  A mild laxative (Milk of Magnesia or Miralax) should be taken according to package directions if there are no bowel movements after 48 hours. 7.  You may have steri-strips (small skin tapes) in place directly over the incision.  These strips should be left on the skin for 7-10 days.  If your  surgeon used skin glue on the incision, you may shower in 24 hours.  The glue will flake off over the next 2-3 weeks.  Any sutures or staples will be removed at the office during your follow-up visit. You may find that a light gauze bandage over your incision may keep your staples from being rubbed or pulled. You may shower and replace the bandage daily. 8. ACTIVITIES:  You may resume regular (light) daily activities beginning the next day--such as daily self-care, walking, climbing stairs--gradually increasing activities as tolerated.  You may have sexual intercourse when it is comfortable.  Refrain from any heavy lifting or straining until approved by your doctor. a. You may drive when you no longer are taking prescription pain medication, you can comfortably wear a seatbelt, and you can safely maneuver your car and apply brakes  9. You should see your doctor in the office for a follow-up appointment approximately two weeks after your surgery.  Make sure that you call for this appointment within a day or two after you arrive home to insure a convenient appointment time.  WHEN TO CALL YOUR DOCTOR: 1. Fever over 101.0 2. Inability to urinate 3. Nausea and/or vomiting 4. Extreme swelling or bruising 5. Continued bleeding from incision. 6. Increased pain, redness, or drainage from the incision. 7. Difficulty swallowing or breathing 8. Muscle cramping or spasms. 9. Numbness or tingling in hands or feet or around lips.  The clinic staff is available to answer your questions during regular business hours.  Please   don't hesitate to call and ask to speak to one of the nurses if you have concerns.  For further questions, please visit [email or web address]   

## 2018-04-11 NOTE — Progress Notes (Signed)
3 Days Post-Op lap sigmoidectomy Subjective: Having bowel function, pain controlled, ambulating, tolerating fulls  Objective: Vital signs in last 24 hours: Temp:  [98 F (36.7 C)-99.8 F (37.7 C)] 98 F (36.7 C) (11/09 0543) Pulse Rate:  [74-95] 74 (11/09 0543) Resp:  [17-18] 18 (11/09 0543) BP: (124-147)/(81-99) 124/81 (11/09 0543) SpO2:  [99 %-100 %] 100 % (11/09 0543) Weight:  [91.5 kg] 91.5 kg (11/09 0723)   Intake/Output from previous day: 11/08 0701 - 11/09 0700 In: 3568.3 [P.O.:1480; I.V.:2088.3] Out: 4550 [Urine:4550] Intake/Output this shift: Total I/O In: 786.7 [I.V.:786.7] Out: -    General appearance: alert and cooperative GI: normal findings: soft, non-tender  Incision: clean, dry, intact  Lab Results:  Recent Labs    04/09/18 0534  WBC 10.2  HGB 9.6*  HCT 31.4*  PLT 249   BMET Recent Labs    04/09/18 0534  NA 138  K 3.8  CL 106  CO2 23  GLUCOSE 120*  BUN 11  CREATININE 1.01  CALCIUM 9.1   PT/INR No results for input(s): LABPROT, INR in the last 72 hours. ABG No results for input(s): PHART, HCO3 in the last 72 hours.  Invalid input(s): PCO2, PO2  MEDS, Scheduled . sodium chloride   Intravenous Once  . acetaminophen  1,000 mg Oral Q8H  . alvimopan  12 mg Oral BID  . enoxaparin (LOVENOX) injection  40 mg Subcutaneous Q24H  . famotidine  20 mg Oral BID  . ibuprofen  400 mg Oral Q8H    Studies/Results: No results found.  Assessment: s/p Procedure(s): LAPAROSCOPIC ASSISTED LOW ANTERIOR RESECTION Patient Active Problem List   Diagnosis Date Noted  . Rectal bleed 04/05/2018  . Bright red blood per rectum 04/04/2018  . Lower GI bleed 04/04/2018      Plan: Advance diet to soft foods SL IVF's Ambulate PO pain meds Anticipate d/c tomorrow   LOS: 7 days     .Rosario Adie, MD [facility] [place]   04/11/2018 9:45 AM

## 2018-04-15 ENCOUNTER — Telehealth: Payer: Self-pay | Admitting: Hematology

## 2018-04-15 ENCOUNTER — Encounter: Payer: Self-pay | Admitting: Hematology

## 2018-04-15 NOTE — Telephone Encounter (Signed)
New referral received from Dr. Marlou Starks for colon cancer. Pt has been cld and scheduled to see Dr. Burr Medico on 11/22 at 230pm. Lft the pt a vm w/the appt information. Letter mailed w/appt date and time.

## 2018-04-16 ENCOUNTER — Telehealth: Payer: Self-pay | Admitting: Hematology

## 2018-04-16 NOTE — Telephone Encounter (Signed)
Pt returned my call to confirm appt w/Dr. Burr Medico on 11/22 at 230pm.

## 2018-04-21 DIAGNOSIS — C187 Malignant neoplasm of sigmoid colon: Secondary | ICD-10-CM | POA: Insufficient documentation

## 2018-04-21 NOTE — Progress Notes (Addendum)
[place]  [phone] Fax:([phone]) [place] Note   Patient Care Team: Via, Lennette Bihari, MD as PCP - General (Family Medicine) 04/22/2018  CHIEF COMPLAINTS/PURPOSE OF CONSULTATION:  Colon cancer; consult requested by Dr. 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 Biopsied. Tattooed. - One 6 mm polyp in the transverse colon, removed with a hot snare. Resected and retrieved. - The examined portion of the ileum was normal.    04/06/2018 Initial Biopsy    Diagnosis 1. Colon, polyp(s), transverse - TUBULAR ADENOMA. - NO HIGH GRADE DYSPLASIA IDENTIFIED. 2. Colon, biopsy, left - ADENOCARCINOMA.    04/06/2018 Imaging    CT CAP  IMPRESSION: 1. Apparent circumferential wall thickening of the sigmoid colon which may represent this patient's known colonic mass. No evidence of enlarged lymph nodes or metastatic disease. 2. No other significant abnormalities.    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 Marlou Starks     04/08/2018 Pathology Results    Diagnosis 1. Colon, segmental resection for tumor, sigmoid - INVASIVE COLONIC ADENOCARCINOMA, 4.3 CM. - TUMOR FOCALLY EXTENDS INTO PERICOLONIC CONNECTIVE TISSUE. - MARGINS NOT INVOLVED. - TWENTY-TWO BENIGN LYMPH NODES (0/22). 2. Colon, resection margin (donut), distal Anastomatic - BENIGN COLON. - NO EVIDENCE OF MALIGNANCY. 3. Colon, resection margin (donut), proximal Anastomatic - BENIGN COLON. - NO EVIDENCE OF MALIGNANCY.    04/21/2018 Cancer Staging    Staging form: Colon and Rectum, AJCC 8th Edition - Pathologic stage from 04/21/2018: Stage IIA (pT3, pN0, cM0) - Signed by Alla Feeling, NP on 04/21/2018    HISTORY OF PRESENTING ILLNESS:  Charles Rowland 42 y.o. male is here because of newly diagnosed sigmoid colon cancer. He had 2 days history of bright red blood per rectum with BM on 10/30 - 11/1, he went to PCP who drew labs and referred him for outpatient GI work up and colonoscopy. While waiting for scheduling he had a witness syncopal episode at home after bloody BM and was brought to ED on 04/04/18 with symptomatic anemia and rectal bleeding. Hgb 8.2 in emergency room, iron studies consistent with iron deficiency anemia; ferritin 6, serum iron 21, and transferrin saturation 6%. He was admitted for GI work up and transfused 2 units RBCs. He reportedly received 1 dose IV iron, which he tolerated well other than some "tingling." Colonoscopy on 04/06/18 showed a frond-like/villous infiltrative and polypoid partially obstructing large mass at 20-25 cm from the anus. Pathology was positive for adenocarcinoma; a polyp in the transverse colon showed tubular adenoma but no malignancy. Staging CT on 11/4 showed circumferential wall thickening in the sigmoid colon without evidence of enlarged lymph nodes of metastatic disease in the chest, abdomen, or pelvis. Pre-op CEA normal at 2.7. He underwent laparoscopic assisted sigmoid colectomy per Dr. Marlou Starks on 04/08/18. Surgical path confirmed adenocarcinoma spanning 4.3 cm, extending focally into pericolonic tissue. Margins were negative. Twenty-two lymph nodes were benign, T3N0. Post-operative course was uncomplicated, he was discharged on 11/9.   Past medical history is unremarkable other than internal hemorrhoids and a herniated disc 7 years ago, for which he was treated by a chiropractor. He never had  a colonoscopy. He denies recent infection or blood clot. He denies previous surgeries. Socially, he drinks alcohol recreationally, not heavy. Denies tobacco or drug use. 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 He eats a well rounded diet and exercises intermittently. He lives at home with his spouse and 4 year old son. He denies family history of colon/rectal cancer. Denies GYN cancers. A MGM had breast cancer and a MGF had lung cancer, he was a smoker.   Today he feels well. He continues to recover well from surgery. He saw Dr. Toth in f/u who removed his staples. Abdominal pain is well managed with tylenol and NSAIDs, pain is 3/10. He has not required oxycodone. He is slowly increasing his activity level. Appetite is normal. Having up to 2 non-bloody BM per day. Prior to his diagnosis he denies fatigue, weight loss, decreased appetite. He had mild constipation and few instances of sharp abdominal pain prior to his ED visit.    MEDICAL HISTORY:  History reviewed. No pertinent past medical history.  SURGICAL HISTORY: Past Surgical History:  Procedure Laterality Date  . BIOPSY  04/06/2018   Procedure: BIOPSY;  Surgeon: Karki, Arya, MD;  Location: [facility];  Service: Gastroenterology;;  . COLONOSCOPY WITH PROPOFOL N/A 04/06/2018   Procedure: COLONOSCOPY WITH PROPOFOL;  Surgeon: Karki, Arya, MD;  Location: [facility];  Service: Gastroenterology;  Laterality: N/A;  . LAPAROSCOPIC SMALL BOWEL RESECTION N/A 04/08/2018   Procedure: LAPAROSCOPIC ASSISTED LOW ANTERIOR RESECTION;  Surgeon: Toth, Paul III, MD;  Location: [facility];  Service: General;  Laterality: N/A;  . POLYPECTOMY  04/06/2018   Procedure: POLYPECTOMY;  Surgeon: Karki, Arya, MD;  Location: [facility];  Service: Gastroenterology;;  . SUBMUCOSAL INJECTION  04/06/2018   Procedure: SUBMUCOSAL INJECTION;  Surgeon: Karki, Arya, MD;  Location: [facility];  Service: Gastroenterology;;    SOCIAL HISTORY: Social History   Socioeconomic History  . Marital status: Married    Spouse name: Not on file  . Number of children: Not on file  . Years of education: Not on file  . Highest education level: Not on file  Occupational  History  . Not on file  Social Needs  . Financial resource strain: Not on file  . Food insecurity:    Worry: Not on file    Inability: Not on file  . Transportation needs:    Medical: Not on file    Non-medical: Not on file  Tobacco Use  . Smoking status: Never Smoker  . Smokeless tobacco: Never Used  Substance and Sexual Activity  . Alcohol use: Yes    Comment: occasional  . Drug use: Never  . Sexual activity: Yes  Lifestyle  . Physical activity:    Days per week: Not on file    Minutes per session: Not on file  . Stress: Not on file  Relationships  . Social connections:    Talks on phone: Not on file    Gets together: Not on file    Attends religious service: Not on file    Active member of club or organization: Not on file    Attends meetings of clubs or organizations: Not on file    Relationship status: Not on file  . Intimate partner violence:    Fear of current or ex partner: Not on file    Emotionally abused: Not on file    Physically abused: Not on file    Forced sexual activity:   Not on file  Other Topics Concern  . Not on file  Social History Narrative  . Not on file    FAMILY HISTORY: Family History  Problem Relation Age of Onset  . Lung cancer Other   . Cancer Maternal Grandmother        breast  . Cancer Maternal Grandfather        lung, smoker  . CAD Neg Hx   . Stroke Neg Hx   . Hypertension Neg Hx     ALLERGIES:  has No Known Allergies.  MEDICATIONS:  Current Outpatient Medications  Medication Sig Dispense Refill  . ibuprofen (ADVIL,MOTRIN) 200 MG tablet Take 400 mg by mouth every 6 (six) hours as needed for moderate pain.    . Lactobacillus (PROBIOTIC ACIDOPHILUS PO) Take 1 tablet by mouth daily.    . psyllium (REGULOID) 0.52 g capsule Take 0.52 g by mouth daily.    . oxyCODONE (OXY IR/ROXICODONE) 5 MG immediate release tablet Take 1-2 tablets (5-10 mg total) by mouth every 6 (six) hours as needed for moderate pain or severe pain (5 mod, 10  severe). 30 tablet 0   No current facility-administered medications for this visit.     PHYSICAL EXAMINATION: ECOG PERFORMANCE STATUS: 1 - Symptomatic but completely ambulatory  Vitals:   04/22/18 1340 04/22/18 1551  BP: (!) 145/116 132/80  Pulse: 62   Resp: 18   Temp: 98.8 F (37.1 C)   SpO2: 100%    Filed Weights   04/22/18 1340  Weight: 196 lb 9.6 oz (89.2 kg)    GENERAL:alert, no distress and comfortable SKIN: no rashes or significant lesions EYES:  sclera clear OROPHARYNX:no thrush or ulcers, no mass LYMPH:  no palpable cervical, supraclavicular, inguinal, or axillary lymphadenopathy  LUNGS: clear to auscultation with normal breathing effort HEART: regular rate & rhythm, no lower extremity edema ABDOMEN:abdomen soft, non-tender and normal bowel sounds. Laparoscopic and midline incisions are healing well, no erythema or discharge  PSYCH: alert & oriented x 3 with fluent speech GU: Testes without mass  LABORATORY DATA:  I have reviewed the data as listed CBC Latest Ref Rng & Units 04/22/2018 04/09/2018 04/08/2018  WBC 4.0 - 10.5 K/uL 8.7 10.2 4.5  Hemoglobin 13.0 - 17.0 g/dL 10.6(L) 9.6(L) 8.9(L)  Hematocrit 39.0 - 52.0 % 33.4(L) 31.4(L) 28.4(L)  Platelets 150 - 400 K/uL 202 249 231   CMP Latest Ref Rng & Units 04/09/2018 04/08/2018 04/05/2018  Glucose 70 - 99 mg/dL 120(H) 116(H) 95  BUN 6 - 20 mg/dL 11 9 14  Creatinine 0.61 - 1.24 mg/dL 1.01 1.16 1.09  Sodium 135 - 145 mmol/L 138 140 137  Potassium 3.5 - 5.1 mmol/L 3.8 3.6 3.8  Chloride 98 - 111 mmol/L 106 108 109  CO2 22 - 32 mmol/L 23 25 23  Calcium 8.9 - 10.3 mg/dL 9.1 8.8(L) 8.2(L)  Total Protein 6.5 - 8.1 g/dL - - 6.0(L)  Total Bilirubin 0.3 - 1.2 mg/dL - - 0.6  Alkaline Phos 38 - 126 U/L - - 42  AST 15 - 41 U/L - - 15  ALT 0 - 44 U/L - - 13     Colonoscopy impression: 11/4 -Non-bleeding internal hemorrhoids - Malignant partially obstructing tumor from 20 to 25 cm proximal to the anus. Biopsied.  Tattooed. - One 6 mm polyp in the transverse colon, removed with a hot snare. Resected and retrieved. - The examined portion of the ileum was normal.   PATHOLOGY  Initial path Diagnosis 11/4 1.   Colon, polyp(s), transverse - TUBULAR ADENOMA. - NO HIGH GRADE DYSPLASIA IDENTIFIED. 2. Colon, biopsy, left - ADENOCARCINOMA.  Surgical path: Diagnosis 04/08/18 1. Colon, segmental resection for tumor, sigmoid - INVASIVE COLONIC ADENOCARCINOMA, 4.3 CM. - TUMOR FOCALLY EXTENDS INTO PERICOLONIC CONNECTIVE TISSUE. - MARGINS NOT INVOLVED. - TWENTY-TWO BENIGN LYMPH NODES (0/22). 2. Colon, resection margin (donut), distal Anastomatic - BENIGN COLON. - NO EVIDENCE OF MALIGNANCY. 3. Colon, resection margin (donut), proximal Anastomatic - BENIGN COLON. - NO EVIDENCE OF MALIGNANCY. Microscopic Comment 1. COLON AND RECTUM: Resection, Including Transanal Disk Excision of Rectal Neoplasms Procedure: Laparoscopic assisted low anterior resection Tumor Site: Distal sigmoid Tumor Size: 4.3 x 4 cm Macroscopic Tumor Perforation: No Histologic Type: Colorectal adenocarcinoma Histologic Grade: Moderately differentiated. Tumor Extension: Into and focally through muscularis propria Margins: Free of tumor Treatment Effect: No Lymphovascular Invasion: Not identified Perineural Invasion: Not identified Tumor Deposits: No Regional Lymph Nodes: Number of Lymph Nodes Involved: 0 Number of Lymph Nodes Examined: 22 Pathologic Stage Classification (pTNM, AJCC 8th Edition): pT3, pN0 Ancillary Studies: Yes (MMR / MSI testing: pending)  Microscopic Comment(continued) Representative tumor block: 1C, 1D, 1E, 1F and 1G Comments: The invasive carcinoma invades into the muscularis propria and there are several microscopic foci where the tumor involves the immediately adjacent pericolonic connective tissue. (JDP:kh 04/10/18) (v4.0.1.0) JOHN PATRICK MD Pathologist, Electronic Signature (Case signed  04/10/2018) Specimen Gross and Clinical Information Specimen(s) Obtained: 1. Colon, segmental resection for tumor, sigmoid 2. Colon, resection margin (donut), distal Anastomatic 3. Colon, resection margin (donut), proximal Anastomatic Gross 1. Specimen: Sigmoid, suture at distal Specimen integrity: Intact Specimen length: 21 cm Mesorectal intactness: Not applicable Tumor location: Tumor involves both mesenteric and anti-mesenteric walls Tumor size: 4 cm in length and 4.3 cm in width tan red firm sessile mass with rolled edges. Percent of bowel circumference involved: 90% Tumor distance to margins: Proximal: 11 cm Distal: 6 cm Mesenteric (sigmoid and transverse): 7 cm Macroscopic extent of tumor invasion: The mass invades through the entire thickness of the muscularis, with minimal involvement of underlying fat. The mass abuts but does not grossly involve serosa. Total presumed lymph nodes: Found are twenty-three possible lymph nodes ranging from 0.3 to 0.9 cm. Extramural satellite tumor nodules: None Mucosal polyp(s): None Additional findings: None Block summary: A = proximal margin B = distal margin C, D = grossly deepest invasion of mass into fat E, F = mass abutting serosa G = mass and adjacent mucosa H = tissue for molecular testing. I = five nodes J = four nodes K = four nodes L = four nodes M = four nodes N = one node bisected O = one node bisected Total = fifteen blocks. 2. Received in formalin labeled distal is a 4.2 x 0.7 x 0.6 cm crescent shaped portion of tan pink smooth mucosa which  Gross(continued) on sectioning has embedded suture material. Representative sections are submitted in one block. 3. Received in formalin labeled proximal is a 1.1 cm in diameter and 0.5 cm thick ring of tan pink smooth, soft mucosa. Representative sections are submitted in one block. (SSW:kh 04/09/18) Disclaimer Some of these immunohistochemical stains may have been developed and  the performance characteristics determined by [facility]. Some may not have been cleared or approved by the U.S. Food and Drug Administration. The FDA has determined that such clearance or approval is not necessary. This test is used for clinical purposes. It should not be regarded as investigational or for research. This laboratory is certified under the Clinical   Laboratory Improvement Amendments of 1988 (CLIA-88) as qualified to perform high complexity clinical laboratory testing. Report signed out from the following location(s) Technical component and interpretation was performed at [facility] [address], [place], [place] [address]. CLIA #: [id number],       RADIOGRAPHIC STUDIES: I have personally reviewed the radiological images as listed and agreed with the findings in the report. Ct Chest W Contrast  Result Date: 04/06/2018 CLINICAL DATA:  41-year-old male with newly diagnosed rectal mass at colonoscopy today. Evaluate for metastatic disease. EXAM: CT CHEST, ABDOMEN, AND PELVIS WITH CONTRAST TECHNIQUE: Multidetector CT imaging of the chest, abdomen and pelvis was performed following the standard protocol during bolus administration of intravenous contrast. CONTRAST:  100mL OMNIPAQUE IOHEXOL 300 MG/ML  SOLN COMPARISON:  None. FINDINGS: CT CHEST FINDINGS Cardiovascular: No significant vascular findings. Normal heart size. No pericardial effusion. Mediastinum/Nodes: No enlarged mediastinal, hilar, or axillary lymph nodes. Thyroid gland, trachea, and esophagus demonstrate no significant findings. Lungs/Pleura: Lungs are clear. No pulmonary nodules or masses. No pleural effusion or pneumothorax. Musculoskeletal: No chest wall mass or suspicious bone lesions identified. CT ABDOMEN PELVIS FINDINGS Hepatobiliary: The liver and gallbladder are unremarkable. No focal hepatic lesions identified. No biliary dilatation. Pancreas: Unremarkable Spleen: Unremarkable  Adrenals/Urinary Tract: The kidneys, adrenal glands and bladder are unremarkable except for bilateral renal cysts. Stomach/Bowel: Apparent circumferential wall thickening of the sigmoid colon (series 2: Image [id number]) may represent this patient's known colonic mass. No adjacent enlarged lymph nodes. No other bowel abnormalities are identified. Vascular/Lymphatic: No significant vascular findings are present. No enlarged abdominal or pelvic lymph nodes. Reproductive: Prostate is unremarkable. Other: No ascites, pneumoperitoneum, focal collection or peritoneal/omental nodularity. Musculoskeletal: No acute or suspicious bony abnormalities. IMPRESSION: 1. Apparent circumferential wall thickening of the sigmoid colon which may represent this patient's known colonic mass. No evidence of enlarged lymph nodes or metastatic disease. 2. No other significant abnormalities. Electronically Signed   By: Jeffrey  Hu M.D.   On: 04/06/2018 20:24   Ct Abdomen Pelvis W Contrast  Result Date: 04/06/2018 CLINICAL DATA:  41-year-old male with newly diagnosed rectal mass at colonoscopy today. Evaluate for metastatic disease. EXAM: CT CHEST, ABDOMEN, AND PELVIS WITH CONTRAST TECHNIQUE: Multidetector CT imaging of the chest, abdomen and pelvis was performed following the standard protocol during bolus administration of intravenous contrast. CONTRAST:  100mL OMNIPAQUE IOHEXOL 300 MG/ML  SOLN COMPARISON:  None. FINDINGS: CT CHEST FINDINGS Cardiovascular: No significant vascular findings. Normal heart size. No pericardial effusion. Mediastinum/Nodes: No enlarged mediastinal, hilar, or axillary lymph nodes. Thyroid gland, trachea, and esophagus demonstrate no significant findings. Lungs/Pleura: Lungs are clear. No pulmonary nodules or masses. No pleural effusion or pneumothorax. Musculoskeletal: No chest wall mass or suspicious bone lesions identified. CT ABDOMEN PELVIS FINDINGS Hepatobiliary: The liver and gallbladder are unremarkable. No  focal hepatic lesions identified. No biliary dilatation. Pancreas: Unremarkable Spleen: Unremarkable Adrenals/Urinary Tract: The kidneys, adrenal glands and bladder are unremarkable except for bilateral renal cysts. Stomach/Bowel: Apparent circumferential wall thickening of the sigmoid colon (series 2: Image [id number]) may represent this patient's known colonic mass. No adjacent enlarged lymph nodes. No other bowel abnormalities are identified. Vascular/Lymphatic: No significant vascular findings are present. No enlarged abdominal or pelvic lymph nodes. Reproductive: Prostate is unremarkable. Other: No ascites, pneumoperitoneum, focal collection or peritoneal/omental nodularity. Musculoskeletal: No acute or suspicious bony abnormalities. IMPRESSION: 1. Apparent circumferential wall thickening of the sigmoid colon which may represent this patient's known colonic mass. No evidence of enlarged lymph nodes or metastatic disease.   2. No other significant abnormalities. Electronically Signed   By: Jeffrey  Hu M.D.   On: 04/06/2018 20:24    ASSESSMENT & PLAN: 42 yowm with no significant past medical history presented with symptomatic anemia and rectal bleeding, found to have partially obstructing sigmoid colon mass   1. Adenocarcinoma of sigmoid colon, pT3N0M0, G2, stage IIA, MSI-Stable 2. 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 Sherrill and I reviewed his medical record in detail with the patient and family, including colonoscopy, CT, and surgical pathology. He had T3N0 (0/22) disease in the distal sigmoid; margins were uninvolved, no perforation, LVI, or PNI. Dr. Sherrill reviewed given his early stage II cancer and absence of high risk features, he does not recommend adjuvant chemotherapy. He reviewed he has a high chance of being cured with surgery alone.   We discussed colon cancer surveillance which will include f/u with physical exam and CEA in 6 months. He will undergo an  annual colonoscopy this time next year. Dr. Sherrill discussed surveillance CT for stage II cancer is not routine, but will be considered if he develops concerning symptoms or his clinical picture warrants. The patient understands.   Given his MSI-stable tumor, his cancer is not likely related to inheritable cancer syndrome. But due to his young age we will refer him to genetics for further discussion. He has a younger brother who plans to schedule a screening colonoscopy now. His son will also need earlier screening when that becomes age-appropriate.   We discussed prevention measures such as healthy low-fat diet and regular physical exercise. The use of prophylactic aspirin therapy is not standard; he does not require aspirin therapy for heart health at this point and therefore does not recommend it.   His labs in the ED were consistent with iron deficiency anemia. He was transfused 2 units RBCs and 1 dose IV Feraheme. His Hgb improved to 9.6 at discharge. Will obtain CBC and iron studies today. I will call him tomorrow with results. He will return in 6 months for CEA and follow up with Dr. Sherrill.    Orders Placed This Encounter  Procedures  . CBC with Differential (Cancer Center Only)    Standing Status:   Future    Number of Occurrences:   1    Standing Expiration Date:   04/23/2019  . Iron and TIBC    Standing Status:   Future    Number of Occurrences:   1    Standing Expiration Date:   04/23/2019  . Ferritin    Standing Status:   Future    Number of Occurrences:   1    Standing Expiration Date:   04/23/2019  . CEA (IN HOUSE-[facility])    Standing Status:   Future    Standing Expiration Date:   04/23/2019  . Ambulatory referral to Genetics    Referral Priority:   Routine    Referral Type:   Consultation    Referral Reason:   Specialty Services Required    Number of Visits Requested:   1    All questions were answered. The patient knows to call the clinic with any problems,  questions or concerns.     Lacie K Burton, NP 04/22/2018  This was a shared visit with Lacie Burton.  Mr. Glascock interviewed and examined.  He has been diagnosed with early stage II colon cancer.  We reviewed the details of the surgical pathology report and discussed adjuvant treatment options.    His tumor does not have "high risk "features.  I do not recommend adjuvant chemotherapy.  He has a good prognosis for a long-term disease-free survival.  We discussed diet and exercise maneuvers which may decrease the risk of developing colon cancer.  He does not appear to have hereditary non-polyposis colon cancer syndrome, but his family members are at increased risk of developing colorectal cancer and should receive appropriate screening. He will return for an office visit and CEA in 6 months.  We will consider obtaining surveillance CTs in 1 year.  He will schedule a one year surveillance colonoscopy.  The anemia should continue to improve over the next few months.  Brad Sherrill, MD  

## 2018-04-22 ENCOUNTER — Inpatient Hospital Stay: Payer: 59

## 2018-04-22 ENCOUNTER — Inpatient Hospital Stay: Payer: 59 | Attending: Hematology | Admitting: Nurse Practitioner

## 2018-04-22 ENCOUNTER — Telehealth: Payer: Self-pay | Admitting: Nurse Practitioner

## 2018-04-22 ENCOUNTER — Encounter: Payer: Self-pay | Admitting: Nurse Practitioner

## 2018-04-22 VITALS — BP 132/80 | HR 62 | Temp 98.8°F | Resp 18 | Ht 70.0 in | Wt 196.6 lb

## 2018-04-22 DIAGNOSIS — C187 Malignant neoplasm of sigmoid colon: Secondary | ICD-10-CM | POA: Diagnosis not present

## 2018-04-22 DIAGNOSIS — D509 Iron deficiency anemia, unspecified: Secondary | ICD-10-CM | POA: Insufficient documentation

## 2018-04-22 LAB — CBC WITH DIFFERENTIAL (CANCER CENTER ONLY)
Abs Immature Granulocytes: 0.03 10*3/uL (ref 0.00–0.07)
BASOS ABS: 0 10*3/uL (ref 0.0–0.1)
Basophils Relative: 0 %
Eosinophils Absolute: 0.1 10*3/uL (ref 0.0–0.5)
Eosinophils Relative: 1 %
HCT: 33.4 % — ABNORMAL LOW (ref 39.0–52.0)
Hemoglobin: 10.6 g/dL — ABNORMAL LOW (ref 13.0–17.0)
IMMATURE GRANULOCYTES: 0 %
LYMPHS ABS: 1.2 10*3/uL (ref 0.7–4.0)
LYMPHS PCT: 14 %
MCH: 27.2 pg (ref 26.0–34.0)
MCHC: 31.7 g/dL (ref 30.0–36.0)
MCV: 85.6 fL (ref 80.0–100.0)
MONOS PCT: 7 %
Monocytes Absolute: 0.6 10*3/uL (ref 0.1–1.0)
NEUTROS PCT: 78 %
Neutro Abs: 6.7 10*3/uL (ref 1.7–7.7)
PLATELETS: 202 10*3/uL (ref 150–400)
RBC: 3.9 MIL/uL — AB (ref 4.22–5.81)
RDW: 14.1 % (ref 11.5–15.5)
WBC Count: 8.7 10*3/uL (ref 4.0–10.5)
nRBC: 0 % (ref 0.0–0.2)

## 2018-04-22 NOTE — Telephone Encounter (Signed)
Gave pt avs and calendar  °

## 2018-04-23 ENCOUNTER — Encounter: Payer: Self-pay | Admitting: Nurse Practitioner

## 2018-04-23 LAB — IRON AND TIBC
IRON: 23 ug/dL — AB (ref 42–163)
SATURATION RATIOS: 6 % — AB (ref 20–55)
TIBC: 355 ug/dL (ref 202–409)
UIBC: 332 ug/dL (ref 117–376)

## 2018-04-23 LAB — FERRITIN: FERRITIN: 86 ng/mL (ref 24–336)

## 2018-04-24 ENCOUNTER — Ambulatory Visit: Payer: 59 | Admitting: Hematology

## 2018-04-27 ENCOUNTER — Encounter: Payer: Self-pay | Admitting: Nurse Practitioner

## 2018-05-05 ENCOUNTER — Telehealth: Payer: Self-pay | Admitting: Emergency Medicine

## 2018-05-05 NOTE — Telephone Encounter (Signed)
VM left for patient regarding last my chart message. Informed him via VM of opening tomorrow @ 9am to see Cira Rue, NP. Will follow up with the patient first thing in the morning.

## 2018-05-05 NOTE — Telephone Encounter (Signed)
Patient called. He will come @ 9am tomorrow morning to see LB.

## 2018-05-06 ENCOUNTER — Encounter: Payer: Self-pay | Admitting: Nurse Practitioner

## 2018-05-06 ENCOUNTER — Inpatient Hospital Stay: Payer: 59 | Attending: Nurse Practitioner | Admitting: Nurse Practitioner

## 2018-05-06 ENCOUNTER — Ambulatory Visit (HOSPITAL_COMMUNITY)
Admission: RE | Admit: 2018-05-06 | Discharge: 2018-05-06 | Disposition: A | Discharge status: Home / Self Care | Payer: 59 | Source: Ambulatory Visit | Attending: Nurse Practitioner | Admitting: Nurse Practitioner

## 2018-05-06 VITALS — BP 150/97 | HR 101 | Temp 98.8°F | Resp 18 | Ht 70.0 in | Wt 197.4 lb

## 2018-05-06 DIAGNOSIS — D500 Iron deficiency anemia secondary to blood loss (chronic): Secondary | ICD-10-CM | POA: Insufficient documentation

## 2018-05-06 DIAGNOSIS — Z9049 Acquired absence of other specified parts of digestive tract: Secondary | ICD-10-CM

## 2018-05-06 DIAGNOSIS — Z79899 Other long term (current) drug therapy: Secondary | ICD-10-CM | POA: Insufficient documentation

## 2018-05-06 DIAGNOSIS — M79605 Pain in left leg: Secondary | ICD-10-CM | POA: Insufficient documentation

## 2018-05-06 DIAGNOSIS — C187 Malignant neoplasm of sigmoid colon: Secondary | ICD-10-CM | POA: Insufficient documentation

## 2018-05-06 NOTE — Progress Notes (Addendum)
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 Biopsied. Tattooed. - One 6 mm polyp in the transverse colon, removed with a hot snare. Resected and retrieved. - The examined portion of the ileum was normal.    04/06/2018 Initial Biopsy    Diagnosis 1. Colon, polyp(s), transverse - TUBULAR ADENOMA. - NO HIGH GRADE DYSPLASIA IDENTIFIED. 2. Colon, biopsy, left - ADENOCARCINOMA.    04/06/2018 Imaging    CT CAP  IMPRESSION: 1. Apparent circumferential wall thickening of the sigmoid colon which may represent this patient's known colonic mass. No evidence of enlarged lymph nodes or metastatic disease. 2. No other significant abnormalities.    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 Marlou Starks     04/08/2018 Pathology Results    Diagnosis 1. Colon, segmental resection for tumor, sigmoid - INVASIVE COLONIC ADENOCARCINOMA, 4.3 CM. - TUMOR FOCALLY EXTENDS INTO PERICOLONIC CONNECTIVE TISSUE. - MARGINS NOT INVOLVED. - TWENTY-TWO BENIGN LYMPH NODES (0/22). 2. Colon, resection margin (donut), distal Anastomatic - BENIGN COLON. - NO EVIDENCE OF MALIGNANCY. 3. Colon, resection margin (donut), proximal Anastomatic - BENIGN COLON. - NO EVIDENCE OF MALIGNANCY.    04/21/2018 Cancer Staging    Staging form: Colon and Rectum, AJCC 8th Edition - Pathologic stage from 04/21/2018: Stage IIA (pT3, pN0, cM0) - Signed by Charles Feeling, NP on 04/21/2018    CHIEF COMPLAINT: intermittent left leg pain    INTERVAL HISTORY: Charles Rowland presents today for work in visit for left leg pain. He was last seen in consult for colon cancer on 11/20. Two days later he was on a walk outside with his family and noticed intermittent mild generalized aching in his left leg. Pain severity fluctuates, some days more bothersome than others. Pain travels down his leg, occasionally affecting his thigh, or calf, or foot. He denies redness, swelling, cramping, injury, immobility. He sits at work but remains mobile. He denies smoking. He recently traveled to [place] in a car for Thanksgiving, but the pain began before his trip. He has had similar pain in the past related to herniated disc 7 years ago; pain would resolve on its own. He was seeing a chiropractor twice per month before colon cancer diagnosis but has not gone since October. He denies back pain, right leg pain, chest pain, palpitations, cough. He has mild dyspnea on exertion that he feels is related to lack of exercise. Bowels are moving normally with colace, no blood in stool. He takes NSAIDs 1-2 times per day for abdominal pain. He has a firm area at his abdominal incision and called Dr. Ethlyn Gallery office, he will f/u there in 2 weeks.    MEDICAL HISTORY:  History reviewed. No pertinent past medical history.  SURGICAL HISTORY: Past Surgical History:  Procedure Laterality Date  . BIOPSY  04/06/2018   Procedure: BIOPSY;  Surgeon: Ronnette Juniper, MD;  Location: [facility];  Service: Gastroenterology;;  . COLONOSCOPY WITH PROPOFOL N/A 04/06/2018   Procedure: COLONOSCOPY WITH PROPOFOL;  Surgeon: Ronnette Juniper, MD;  Location: [facility];  Service: Gastroenterology;  Laterality: N/A;  . LAPAROSCOPIC SMALL BOWEL RESECTION N/A 04/08/2018   Procedure: LAPAROSCOPIC ASSISTED LOW ANTERIOR RESECTION;  Surgeon: Jovita Kussmaul, MD;  Location: [facility];  Service: General;  Laterality: N/A;  . POLYPECTOMY  04/06/2018   Procedure: POLYPECTOMY;  Surgeon: Ronnette Juniper, MD;  Location:  [facility];  Service: Gastroenterology;;  . Lia Foyer INJECTION  04/06/2018   Procedure: SUBMUCOSAL INJECTION;  Surgeon: Ronnette Juniper, MD;  [facility]: [facility];  Service: Gastroenterology;;    I have reviewed the social history and family history with the patient and they are unchanged from previous note.  ALLERGIES:  has No Known Allergies.  MEDICATIONS:  Current Outpatient Medications  Medication Sig Dispense Refill  . docusate sodium (COLACE) 100 MG capsule Take 100 mg by mouth 2 (two) times daily.    Marland Kitchen ibuprofen (ADVIL,MOTRIN) 200 MG tablet Take 400 mg by mouth every 6 (six) hours as needed for moderate pain.    . Lactobacillus (PROBIOTIC ACIDOPHILUS PO) Take 1 tablet by mouth daily.    . psyllium (REGULOID) 0.52 g capsule Take 0.52 g by mouth daily.     No current facility-administered medications for this visit.     PHYSICAL EXAMINATION: ECOG PERFORMANCE STATUS: 1 - Symptomatic but completely ambulatory  Vitals:   05/06/18 0936  BP: (!) 150/97  Pulse: (!) 101  Resp: 18  Temp: 98.8 F (37.1 C)  SpO2: 100%   Filed Weights   05/06/18 0936  Weight: 197 lb 6.4 oz (89.5 kg)    GENERAL:alert, no distress and comfortable SKIN: no rashes or significant lesions EYES: sclera clear LUNGS: clear to auscultation with normal breathing effort HEART: regular rate & rhythm ABDOMEN:abdomen soft, non-tender and normal bowel sounds. Incision is well healed. Small firm nodule along the incision likely scar tissue  Musculoskeletal: lower extremities are equal and symmetric, no erythema or warmth  NEURO: alert & oriented x 3 with fluent speech, no focal motor deficits  LABORATORY DATA:  I have reviewed the data as listed CBC Latest Ref Rng & Units 04/22/2018 04/09/2018 04/08/2018  WBC 4.0 - 10.5 K/uL 8.7 10.2 4.5  Hemoglobin 13.0 - 17.0 g/dL 10.6(L) 9.6(L) 8.9(L)  Hematocrit 39.0 - 52.0 % 33.4(L) 31.4(L) 28.4(L)  Platelets 150 - 400 K/uL 202 249 231     CMP Latest Ref Rng &  Units 04/09/2018 04/08/2018 04/05/2018  Glucose 70 - 99 mg/dL 120(H) 116(H) 95  BUN 6 - 20 mg/dL _0 Creatinine 0.61 - 1.24 mg/dL 1.01 1.16 1.09  Sodium 135 - 145 mmol/L 138 140 137  Potassium 3.5 - 5.1 mmol/L 3.8 3.6 3.8  Chloride 98 - 111 mmol/L 106 108 109  CO2 22 - 32 mmol/L _1 Calcium 8.9 - 10.3 mg/dL 9.1 8.8(L) 8.2(L)  Total Protein 6.5 - 8.1 g/dL - - 6.0(L)  Total Bilirubin 0.3 - 1.2 mg/dL - - 0.6  Alkaline Phos 38 - 126 U/L - - 42  AST 15 - 41 U/L - - 15  ALT 0 - 44 U/L - - 13      RADIOGRAPHIC STUDIES: I have personally reviewed the radiological images as listed and agreed with the findings in the report. No results found.   ASSESSMENT & PLAN: 42 yowm with no significant past medical history presented with symptomatic anemia and rectal bleeding, found to have partially obstructing sigmoid colon mass   1. Adenocarcinoma of sigmoid colon, pT3N0M0, G2, stage IIA, MSI-Stable 2. Iron deficiency anemia  secondary to GI blood loss and tumor bleeding  3. Acute onset left leg pain   Charles Rowland appears well today. He developed acute onset intermittent left leg pain. He has no other signs and symptoms of DVT, but given his recent abdominal surgery and cancer diagnosis, will obtain doppler study to r/o DVT. I have low clinical suspicion for blood clot. If negative, I suggest he f/u with PCP and return to chiropractor once cleared by Dr. Marlou Starks. He will f/u with surgery in 2 weeks.   I encouraged him to remain active and mobile, and well hydrated to prevent DVT. We reviewed signs and symptoms to monitor.   He continues to recover well from his colectomy. His surgical pain is well managed, bowels moving normally, no blood in stool. He will return [facility] in the next few weeks for genetics and dietician appointments. He will see Dr. Benay Spice for colon cancer surveillance in 5 months as previously scheduled.  All questions were answered. The patient knows to call the clinic with  any problems, questions or concerns. No barriers to learning was detected.     Charles Feeling, NP 05/06/18   Addendum: preliminary doppler report is negative for evidence of DVT.  Cira Rue, NP 05/06/18

## 2018-05-06 NOTE — Progress Notes (Signed)
Left lower extremity venous duplex completed.   Preliminary results in CV Proc.  Abram Sander 05/06/2018 2:21 PM

## 2018-05-11 ENCOUNTER — Encounter: Payer: Self-pay | Admitting: Genetic Counselor

## 2018-05-11 ENCOUNTER — Inpatient Hospital Stay: Payer: 59

## 2018-05-11 ENCOUNTER — Inpatient Hospital Stay (HOSPITAL_BASED_OUTPATIENT_CLINIC_OR_DEPARTMENT_OTHER): Payer: 59 | Admitting: Genetic Counselor

## 2018-05-11 DIAGNOSIS — C187 Malignant neoplasm of sigmoid colon: Secondary | ICD-10-CM

## 2018-05-11 DIAGNOSIS — Z803 Family history of malignant neoplasm of breast: Secondary | ICD-10-CM | POA: Diagnosis not present

## 2018-05-11 NOTE — Progress Notes (Signed)
REFERRING PROVIDER: Truitt Merle, MD [address] [place], [place] [address]  PRIMARY PROVIDER:  Dineen Kid, MD  PRIMARY REASON FOR VISIT:  1. 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 Family history of breast cancer      HISTORY OF PRESENT ILLNESS:   Charles Rowland, a 42 y.o. male, was seen for a [place] cancer genetics consultation at the request of Dr. Burr Medico due to a personal and family history of cancer.  Charles Rowland presents to clinic today to discuss the possibility of a hereditary predisposition to cancer, genetic testing, and to further clarify his future cancer risks, as well as potential cancer risks for family members.   In November 2019, at the age of 89, Charles Rowland was diagnosed with adenocarcinoma of the sigmoid colon. This was treated with surgery.  Tumor testing on his colon tumor was normal, with MSI-S and no loss of IHC.     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 Biopsied. Tattooed. - One 6 mm polyp in the transverse colon, removed with a hot snare. Resected and retrieved. - The examined portion of the ileum was normal.    04/06/2018 Initial Biopsy    Diagnosis 1. Colon, polyp(s), transverse - TUBULAR ADENOMA. - NO HIGH GRADE DYSPLASIA IDENTIFIED. 2. Colon, biopsy, left - ADENOCARCINOMA.    04/06/2018 Imaging    CT CAP  IMPRESSION: 1. Apparent circumferential wall thickening of the sigmoid colon which may represent this patient's known colonic mass. No evidence of enlarged lymph nodes or metastatic disease. 2. No other significant abnormalities.    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 Marlou Starks     04/08/2018 Pathology Results   Diagnosis 1. Colon, segmental resection for tumor, sigmoid - INVASIVE COLONIC ADENOCARCINOMA, 4.3 CM. - TUMOR FOCALLY EXTENDS INTO PERICOLONIC CONNECTIVE TISSUE. - MARGINS NOT INVOLVED. - TWENTY-TWO BENIGN LYMPH NODES (0/22). 2. Colon, resection margin (donut), distal Anastomatic - BENIGN COLON. - NO EVIDENCE OF MALIGNANCY. 3. Colon, resection margin (donut), proximal Anastomatic - BENIGN COLON. - NO EVIDENCE OF MALIGNANCY.    04/21/2018 Cancer Staging    Staging form: Colon and Rectum, AJCC 8th Edition - Pathologic stage from 04/21/2018: Stage IIA (pT3, pN0, cM0) - Signed by Alla Feeling, NP on 04/21/2018      Past Medical History:  Diagnosis Date  . Family history of breast cancer     Past Surgical History:  Procedure Laterality Date  . BIOPSY  04/06/2018   Procedure: BIOPSY;  Surgeon: Ronnette Juniper, MD;  [facility];  Service: Gastroenterology;;  . COLONOSCOPY WITH PROPOFOL N/A 04/06/2018   Procedure: COLONOSCOPY WITH PROPOFOL;  Surgeon: Ronnette Juniper, MD;  [facility];  Service: Gastroenterology;  Laterality: N/A;  . LAPAROSCOPIC SMALL BOWEL RESECTION N/A 04/08/2018   Procedure: LAPAROSCOPIC ASSISTED LOW ANTERIOR RESECTION;  Surgeon: Jovita Kussmaul, MD;  Location: [facility];  Service: General;  Laterality: N/A;  . POLYPECTOMY  04/06/2018   Procedure: POLYPECTOMY;  Surgeon: Ronnette Juniper, MD;  [facility];  Service: Gastroenterology;;  . Lia Foyer INJECTION  04/06/2018   Procedure: SUBMUCOSAL INJECTION;  Surgeon: Ronnette Juniper, MD;  [facility];  Service: Gastroenterology;;  Social History   Socioeconomic History  . Marital status: Married    Spouse name: Not on file  . Number of children: Not on file  . Years of education: Not on file  . Highest education level: Not on file  Occupational History  . Not on file  Social Needs  . Financial resource strain: Not on file  . Food insecurity:    Worry: Not on file    Inability: Not on  file  . Transportation needs:    Medical: Not on file    Non-medical: Not on file  Tobacco Use  . Smoking status: Never Smoker  . Smokeless tobacco: Never Used  Substance and Sexual Activity  . Alcohol use: Yes    Comment: occasional  . Drug use: Never  . Sexual activity: Yes  Lifestyle  . Physical activity:    Days per week: Not on file    Minutes per session: Not on file  . Stress: Not on file  Relationships  . Social connections:    Talks on phone: Not on file    Gets together: Not on file    Attends religious service: Not on file    Active member of club or organization: Not on file    Attends meetings of clubs or organizations: Not on file    Relationship status: Not on file  Other Topics Concern  . Not on file  Social History Narrative  . Not on file     FAMILY HISTORY:  We obtained a detailed, 4-generation family history.  Significant diagnoses are listed below: Family History  Problem Relation Age of Onset  . Lung cancer Other   . Breast cancer Maternal Grandmother        d. 21  . Cancer Maternal Grandfather        lung, smoker  . Kidney disease Maternal Aunt   . CAD Neg Hx   . Stroke Neg Hx   . Hypertension Neg Hx     The patient has one son who is 4 and cancer free.  He has one brother who is cancer free.  Both parents are living.   The patient's mother is 14 and healthy.  She has one brother who has kidney problems but does not have cancer.  Both maternal grandparents are deceased. The grandmother had breast cancer and died in her 61's and the grandfather had lung cancer.  The patient's father is living at 48.  He has five brothers who are all cancer free.  The paternal grandmother is living in her 29's and the grandfather is deceased.  Charles Rowland is unaware of previous family history of genetic testing for hereditary cancer risks. Patient's maternal ancestors are of [place] descent, and paternal ancestors are of [place] and [place] descent. There  is no reported Ashkenazi Jewish ancestry. There is no known consanguinity.  GENETIC COUNSELING ASSESSMENT: Charles Rowland is a 42 y.o. male with a personal and family history of cancer which is somewhat suggestive of a hereditary colon cancer syndrome and predisposition to cancer. We, therefore, discussed and recommended the following at today's visit.   DISCUSSION: We discussed that about 5-7% of colon cancer is hereditary with most cases due to Lynch syndrome.  We discussed that there are several genes associated with Lynch syndrome, each gene conveys different cancer risks.  Charles Rowland was screened for Lynch syndrome through MSI and IHC testing on his tumor which was negative.  Therefore this reduces the chances that he would be positive  for Lynch syndrome. We discussed that MYH associated polyposis (MAP) and FAP are two additional conditions that are associated with young colon cancer.  His colonoscopy found one additional polyp other than his CRC, and therefore is not typical for polyposis syndromes, and therefore the risks are relatively low for a polyposis syndrome.  We reviewed the characteristics, features and inheritance patterns of hereditary cancer syndromes. We also discussed genetic testing, including the appropriate family members to test, the process of testing, insurance coverage and turn-around-time for results. We discussed the implications of a negative, positive and/or variant of uncertain significant result. We recommended Charles Rowland pursue genetic testing for the common hereditary gene panel. The Hereditary Gene Panel offered by [facility] includes sequencing and/or deletion duplication testing of the following 47 genes: APC, ATM, AXIN2, BARD1, BMPR1A, BRCA1, BRCA2, BRIP1, CDH1, CDK4, CDKN2A (p14ARF), CDKN2A (p16INK4a), CHEK2, CTNNA1, DICER1, EPCAM (Deletion/duplication testing only), GREM1 (promoter region deletion/duplication testing only), KIT, MEN1, MLH1, MSH2, MSH3, MSH6, MUTYH,  NBN, NF1, NHTL1, PALB2, PDGFRA, PMS2, POLD1, POLE, PTEN, RAD50, RAD51C, RAD51D, SDHB, SDHC, SDHD, SMAD4, SMARCA4. STK11, TP53, TSC1, TSC2, and VHL.  The following genes were evaluated for sequence changes only: SDHA and HOXB13 c.251G>A variant only.   Based on Mr. Granderson personal and family history of cancer, he meets medical criteria for genetic testing. Despite that he meets criteria, he may still have an out of pocket cost. We discussed that if his out of pocket cost for testing is over $100, the laboratory will call and confirm whether he wants to proceed with testing.  If the out of pocket cost of testing is less than $100 he will be billed by the genetic testing laboratory.   PLAN: After considering the risks, benefits, and limitations, Charles Rowland  provided informed consent to pursue genetic testing and the blood sample was sent to [facility] for analysis of the common hereditary cancer panel. Results should be available within approximately 2-3 weeks' time, at which point they will be disclosed by telephone to Charles Rowland, as will any additional recommendations warranted by these results. Charles Rowland will receive a summary of his genetic counseling visit and a copy of his results once available. This information will also be available in Epic. We encouraged Charles Rowland to remain in contact with cancer genetics annually so that we can continuously update the family history and inform him of any changes in cancer genetics and testing that may be of benefit for his family. Charles Rowland questions were answered to his satisfaction today. Our contact information was provided should additional questions or concerns arise.  Lastly, we encouraged Charles Rowland to remain in contact with cancer genetics annually so that we can continuously update the family history and inform him of any changes in cancer genetics and testing that may be of benefit for this family.   Mr.  Rowland  questions were answered to his satisfaction today. Our contact information was provided should additional questions or concerns arise. Thank you for the referral and allowing [place] to share in the care of your patient.   Karen P. Florene Glen, [place], [facility] Certified Genetic Counselor [email or web address] phone: [phone]  The patient was seen for a total of 55 minutes in face-to-face genetic counseling.  This patient was discussed with Drs. Magrinat, Lindi Adie and/or Burr Medico who agrees with the above.    _______________________________________________________________________ For Office Staff:  Number of people involved in session: 1 Was an Intern/ student involved with case: no

## 2018-05-18 ENCOUNTER — Inpatient Hospital Stay: Payer: 59 | Admitting: Nutrition

## 2018-05-18 NOTE — Progress Notes (Signed)
42 year old male diagnosed with colon cancer in November.  He is status post lap assisted sigmoid colectomy.  He is a patient of Dr. Benay Spice.  Past medical history is nonsignificant.  Medications include Colace and probiotic.  Height: 5 feet 10 inches. Weight: 197.4 pounds. BMI: 28.32.  Nutrition diagnosis: Food and nutrition related knowledge deficit related to recent diagnosis of colon cancer as evidenced by no prior need for nutrition related information.  Intervention: Patient was educated to consume more plant-based foods and high-fiber grains along with lean protein sources. Reviewed diet specifically for colon cancer. Many handouts were provided and questions were answered. Patient has my contact information if he develops questions. Nutrition diagnosis resolved. No follow-up is scheduled.  **Disclaimer: This note was dictated with voice recognition software. Similar sounding words can inadvertently be transcribed and this note may contain transcription errors which may not have been corrected upon publication of note.**

## 2018-05-21 ENCOUNTER — Encounter: Payer: Self-pay | Admitting: Genetic Counselor

## 2018-05-21 ENCOUNTER — Telehealth: Payer: Self-pay | Admitting: Genetic Counselor

## 2018-05-21 DIAGNOSIS — Z1379 Encounter for other screening for genetic and chromosomal anomalies: Secondary | ICD-10-CM | POA: Insufficient documentation

## 2018-05-21 DIAGNOSIS — Z13220 Encounter for screening for lipoid disorders: Secondary | ICD-10-CM | POA: Insufficient documentation

## 2018-05-21 DIAGNOSIS — Z0000 Encounter for general adult medical examination without abnormal findings: Secondary | ICD-10-CM | POA: Insufficient documentation

## 2018-05-21 NOTE — Telephone Encounter (Signed)
LM on VM that results are back and to please call.  Left CB instructions. 

## 2018-05-22 ENCOUNTER — Ambulatory Visit: Payer: Self-pay | Admitting: Genetic Counselor

## 2018-05-22 DIAGNOSIS — Z1379 Encounter for other screening for genetic and chromosomal anomalies: Secondary | ICD-10-CM

## 2018-05-22 NOTE — Telephone Encounter (Signed)
Revealed negative genetic testing.  Discussed that we do not know why he has colon cancer or why there is cancer in the family. It could be due to a different gene that we are not testing, or maybe our current technology may not be able to pick something up.  It will be important for him to keep in contact with genetics to keep up with whether additional testing may be needed. ° ° °

## 2018-05-22 NOTE — Progress Notes (Signed)
HPI:  [name] was previously seen in the [facility] clinic due to a personal and family history of cancer and concerns regarding a hereditary predisposition to cancer. Please refer to our prior cancer genetics clinic note for more information regarding [name] medical, social and family histories, and our assessment and recommendations, at the time. [name] recent genetic test results were disclosed to him, as were recommendations warranted by these results. These results and recommendations are discussed in more detail below.  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 Biopsied. Tattooed. - One 6 mm polyp in the transverse colon, removed with a hot snare. Resected and retrieved. - The examined portion of the ileum was normal.    04/06/2018 Initial Biopsy    Diagnosis 1. Colon, polyp(s), transverse - TUBULAR ADENOMA. - NO HIGH GRADE DYSPLASIA IDENTIFIED. 2. Colon, biopsy, left - ADENOCARCINOMA.    04/06/2018 Imaging    CT CAP  IMPRESSION: 1. Apparent circumferential wall thickening of the sigmoid colon which may represent this patient's known colonic mass. No evidence of enlarged lymph nodes or metastatic disease. 2. No other significant abnormalities.    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 Marlou Starks     04/08/2018 Pathology Results    Diagnosis 1. Colon, segmental resection for tumor, sigmoid - INVASIVE COLONIC ADENOCARCINOMA, 4.3 CM. - TUMOR FOCALLY EXTENDS INTO PERICOLONIC CONNECTIVE TISSUE. - MARGINS NOT INVOLVED. - TWENTY-TWO BENIGN LYMPH NODES (0/22). 2. Colon, resection margin (donut), distal Anastomatic - BENIGN COLON. - NO  EVIDENCE OF MALIGNANCY. 3. Colon, resection margin (donut), proximal Anastomatic - BENIGN COLON. - NO EVIDENCE OF MALIGNANCY.    04/21/2018 Cancer Staging    Staging form: Colon and Rectum, AJCC 8th Edition - Pathologic stage from 04/21/2018: Stage IIA (pT3, pN0, cM0) - Signed by Alla Feeling, NP on 04/21/2018    05/20/2018 Genetic Testing    Negative genetic testing on the common hereditary cancer panel.  The Hereditary Gene Panel offered by [facility] includes sequencing and/or deletion duplication testing of the following 47 genes: APC, ATM, AXIN2, BARD1, BMPR1A, BRCA1, BRCA2, BRIP1, CDH1, CDK4, CDKN2A (p14ARF), CDKN2A (p16INK4a), CHEK2, CTNNA1, DICER1, EPCAM (Deletion/duplication testing only), GREM1 (promoter region deletion/duplication testing only), KIT, MEN1, MLH1, MSH2, MSH3, MSH6, MUTYH, NBN, NF1, NHTL1, PALB2, PDGFRA, PMS2, POLD1, POLE, PTEN, RAD50, RAD51C, RAD51D, SDHB, SDHC, SDHD, SMAD4, SMARCA4. STK11, TP53, TSC1, TSC2, and VHL.  The following genes were evaluated for sequence changes only: SDHA and HOXB13 c.251G>A variant only. The report date is May 20, 2018.     FAMILY HISTORY:  We obtained a detailed, 4-generation family history.  Significant diagnoses are listed below: Family History  Problem Relation Age of Onset  . Lung cancer Other   . Breast cancer Maternal Grandmother        d. 14  . Cancer Maternal Grandfather        lung, smoker  . Kidney disease Maternal Aunt   . CAD Neg Hx   . Stroke Neg Hx   . Hypertension Neg Hx     The patient has one son who is 4 and cancer free.  He has one brother who is cancer  free.  Both parents are living.   The patient's mother is 83 and healthy.  She has one brother who has kidney problems but does not have cancer.  Both maternal grandparents are deceased. The grandmother had breast cancer and died in her 27's and the grandfather had lung cancer.  The patient's father is living at 12.  He has five brothers who are all  cancer free.  The paternal grandmother is living in her 59's and the grandfather is deceased.  [name] is unaware of previous family history of genetic testing for hereditary cancer risks. Patient's maternal ancestors are of [place] descent, and paternal ancestors are of [place] and [place] descent. There is no reported Ashkenazi Jewish ancestry. There is no known consanguinity.  GENETIC TEST RESULTS: Genetic testing reported out on May 20, 2018 through the common hereditary cancer panel found no deleterious mutations. The Hereditary Gene Panel offered by [facility] includes sequencing and/or deletion duplication testing of the following 48 genes: APC, ATM, AXIN2, BARD1, BMPR1A, BRCA1, BRCA2, BRIP1, CDH1, CDK4, CDKN2A (p14ARF), CDKN2A (p16INK4a), CHEK2, CTNNA1, DICER1, EPCAM (Deletion/duplication testing only), GREM1 (promoter region deletion/duplication testing only), KIT, MEN1, MLH1, MSH2, MSH3, MSH6, MUTYH, NBN, NF1, NHTL1, PALB2, PDGFRA, PMS2, POLD1, POLE, PTEN, RAD50, RAD51C, RAD51D, RNF43, SDHB, SDHC, SDHD, SMAD4, SMARCA4. STK11, TP53, TSC1, TSC2, and VHL.  The following genes were evaluated for sequence changes only: SDHA and HOXB13 c.251G>A variant only.   The test report has been scanned into EPIC and is located under the Molecular Pathology section of the Results Review tab.    We discussed with Charles Rowland that since the current genetic testing is not perfect, it is possible there may be a gene mutation in one of these genes that current testing cannot detect, but that chance is small.  We also discussed, that it is possible that another gene that has not yet been discovered, or that we have not yet tested, is responsible for the cancer diagnoses in the family, and it is, therefore, important to remain in touch with cancer genetics in the future so that we can continue to offer Charles Rowland the most up to date genetic testing.     CANCER SCREENING RECOMMENDATIONS: This result is  reassuring and indicates that [name] likely does not have an increased risk for a future cancer due to a mutation in one of these genes. This normal test also suggests that [name] cancer was most likely not due to an inherited predisposition associated with one of these genes.  Most cancers happen by chance and this negative test suggests that his cancer falls into this category.  We, therefore, recommended he continue to follow the cancer management and screening guidelines provided by his oncology and primary healthcare provider.   An individual's cancer risk and medical management are not determined by genetic test results alone. Overall cancer risk assessment incorporates additional factors, including personal medical history, family history, and any available genetic information that may result in a personalized plan for cancer prevention and surveillance.  RECOMMENDATIONS FOR FAMILY MEMBERS:  Individuals in this family might be at some increased risk of developing cancer, over the general population risk, simply due to the family history of cancer.  We recommended women in this family have a yearly mammogram beginning at age 93, or 43 years younger than the earliest onset of cancer, an annual clinical breast exam, and perform monthly breast self-exams. Women in this family should also have a gynecological exam as recommended by their primary  provider. All family members should have a colonoscopy by age 82.  FOLLOW-UP: Lastly, we discussed with [name] that cancer genetics is a rapidly advancing field and it is possible that new genetic tests will be appropriate for him and/or his family members in the future. We encouraged him to remain in contact with cancer genetics on an annual basis so we can update his personal and family histories and let him know of advances in cancer genetics that may benefit this family.   Our contact number was provided. [name] questions were  answered to his satisfaction, and he knows he is welcome to call us at anytime with additional questions or concerns.   Roma Kayser, MS, [facility] Certified Genetic Counselor [email or web address]

## 2018-06-07 DIAGNOSIS — Z8739 Personal history of other diseases of the musculoskeletal system and connective tissue: Secondary | ICD-10-CM | POA: Insufficient documentation

## 2018-08-21 ENCOUNTER — Encounter: Payer: Self-pay | Admitting: Oncology

## 2018-10-20 ENCOUNTER — Inpatient Hospital Stay (HOSPITAL_BASED_OUTPATIENT_CLINIC_OR_DEPARTMENT_OTHER): Payer: 59 | Admitting: Oncology

## 2018-10-20 ENCOUNTER — Other Ambulatory Visit: Payer: Self-pay

## 2018-10-20 ENCOUNTER — Telehealth: Payer: Self-pay | Admitting: Oncology

## 2018-10-20 ENCOUNTER — Inpatient Hospital Stay: Payer: 59 | Attending: Oncology

## 2018-10-20 VITALS — BP 152/102 | HR 104 | Temp 98.2°F | Resp 20 | Ht 70.0 in | Wt 200.7 lb

## 2018-10-20 DIAGNOSIS — D500 Iron deficiency anemia secondary to blood loss (chronic): Secondary | ICD-10-CM

## 2018-10-20 DIAGNOSIS — I10 Essential (primary) hypertension: Secondary | ICD-10-CM | POA: Diagnosis not present

## 2018-10-20 DIAGNOSIS — C187 Malignant neoplasm of sigmoid colon: Secondary | ICD-10-CM

## 2018-10-20 LAB — CEA (IN HOUSE-CHCC): CEA (CHCC-In House): 3.15 ng/mL (ref 0.00–5.00)

## 2018-10-20 NOTE — Progress Notes (Signed)
  [facility] OFFICE PROGRESS NOTE   Diagnosis: Colon cancer  INTERVAL HISTORY:   Charles Rowland returns as scheduled.  He reports resolution of left leg pain.  Good appetite and energy level.  No difficulty with bowel function.  He feels well.  Objective:  Vital signs in last 24 hours:  Blood pressure (!) 152/102, pulse (!) 104, temperature 98.2 F (36.8 C), temperature source Oral, resp. rate 20, height _0  (1.778 m), weight 200 lb 11.2 oz (91 kg), SpO2 99 %.    HEENT: Neck without mass Lymphatics: No cervical, supraclavicular, axillary, or inguinal nodes Resp: Lungs clear bilaterally Cardio: Regular rate and rhythm GI: No hepatosplenomegaly, no mass, midline scar without evidence of recurrent tumor Vascular: No leg edema   Lab Results:   Lab Results  Component Value Date   CEA1 2.7 04/06/2018    Medications: I have reviewed the patient's current medications.   Assessment/Plan: 1.  Adenocarcinoma of sigmoid colon, pT3N0M0, G2, stage IIA, MSI-Stable 2.  History of iron deficiency anemia secondary to GI blood loss and tumor bleeding  3.  Left leg pain December 2019-negative Doppler, resolved 4.  Hypertension    Disposition: Charles Rowland is in clinical remission from colon cancer.  We will follow-up on the CEA from today.  He will return for an office visit and CEA in 6 months.  We encouraged him to follow-up with his primary physician for management of hypertension.  Betsy Coder, MD  10/20/2018  12:07 PM

## 2018-10-20 NOTE — Telephone Encounter (Signed)
Scheduled appt per 5/19 los. ° °A calendar will be mailed out. °

## 2018-10-20 NOTE — Patient Instructions (Signed)
Please work towards re-establishing with primary care MD to follow up on your blood pressure.   Hypertension Hypertension, commonly called high blood pressure, is when the force of blood pumping through the arteries is too strong. The arteries are the blood vessels that carry blood from the heart throughout the body. Hypertension forces the heart to work harder to pump blood and may cause arteries to become narrow or stiff. Having untreated or uncontrolled hypertension can cause heart attacks, strokes, kidney disease, and other problems. A blood pressure reading consists of a higher number over a lower number. Ideally, your blood pressure should be below 120/80. The first ("top") number is called the systolic pressure. It is a measure of the pressure in your arteries as your heart beats. The second ("bottom") number is called the diastolic pressure. It is a measure of the pressure in your arteries as the heart relaxes. What are the causes? The cause of this condition is not known. What increases the risk? Some risk factors for high blood pressure are under your control. Others are not. Factors you can change  Smoking.  Having type 2 diabetes mellitus, high cholesterol, or both.  Not getting enough exercise or physical activity.  Being overweight.  Having too much fat, sugar, calories, or salt (sodium) in your diet.  Drinking too much alcohol. Factors that are difficult or impossible to change  Having chronic kidney disease.  Having a family history of high blood pressure.  Age. Risk increases with age.  Race. You may be at higher risk if you are African-American.  Gender. Men are at higher risk than women before age 64. After age 10, women are at higher risk than men.  Having obstructive sleep apnea.  Stress. What are the signs or symptoms? Extremely high blood pressure (hypertensive crisis) may cause:  Headache.  Anxiety.  Shortness of breath.  Nosebleed.  Nausea and  vomiting.  Severe chest pain.  Jerky movements you cannot control (seizures). How is this diagnosed? This condition is diagnosed by measuring your blood pressure while you are seated, with your arm resting on a surface. The cuff of the blood pressure monitor will be placed directly against the skin of your upper arm at the level of your heart. It should be measured at least twice using the same arm. Certain conditions can cause a difference in blood pressure between your right and left arms. Certain factors can cause blood pressure readings to be lower or higher than normal (elevated) for a short period of time:  When your blood pressure is higher when you are in a health care provider's office than when you are at home, this is called white coat hypertension. Most people with this condition do not need medicines.  When your blood pressure is higher at home than when you are in a health care provider's office, this is called masked hypertension. Most people with this condition may need medicines to control blood pressure. If you have a high blood pressure reading during one visit or you have normal blood pressure with other risk factors:  You may be asked to return on a different day to have your blood pressure checked again.  You may be asked to monitor your blood pressure at home for 1 week or longer. If you are diagnosed with hypertension, you may have other blood or imaging tests to help your health care provider understand your overall risk for other conditions. How is this treated? This condition is treated by making healthy lifestyle  changes, such as eating healthy foods, exercising more, and reducing your alcohol intake. Your health care provider may prescribe medicine if lifestyle changes are not enough to get your blood pressure under control, and if:  Your systolic blood pressure is above 130.  Your diastolic blood pressure is above 80. Your personal target blood pressure may vary  depending on your medical conditions, your age, and other factors. Follow these instructions at home: Eating and drinking   Eat a diet that is high in fiber and potassium, and low in sodium, added sugar, and fat. An example eating plan is called the DASH (Dietary Approaches to Stop Hypertension) diet. To eat this way: ? Eat plenty of fresh fruits and vegetables. Try to fill half of your plate at each meal with fruits and vegetables. ? Eat whole grains, such as whole wheat pasta, brown rice, or whole grain bread. Fill about one quarter of your plate with whole grains. ? Eat or drink low-fat dairy products, such as skim milk or low-fat yogurt. ? Avoid fatty cuts of meat, processed or cured meats, and poultry with skin. Fill about one quarter of your plate with lean proteins, such as fish, chicken without skin, beans, eggs, and tofu. ? Avoid premade and processed foods. These tend to be higher in sodium, added sugar, and fat.  Reduce your daily sodium intake. Most people with hypertension should eat less than 1,500 mg of sodium a day.  Limit alcohol intake to no more than 1 drink a day for nonpregnant women and 2 drinks a day for men. One drink equals 12 oz of beer, 5 oz of wine, or 1 oz of hard liquor. Lifestyle   Work with your health care provider to maintain a healthy body weight or to lose weight. Ask what an ideal weight is for you.  Get at least 30 minutes of exercise that causes your heart to beat faster (aerobic exercise) most days of the week. Activities may include walking, swimming, or biking.  Include exercise to strengthen your muscles (resistance exercise), such as pilates or lifting weights, as part of your weekly exercise routine. Try to do these types of exercises for 30 minutes at least 3 days a week.  Do not use any products that contain nicotine or tobacco, such as cigarettes and e-cigarettes. If you need help quitting, ask your health care provider.  Monitor your blood  pressure at home as told by your health care provider.  Keep all follow-up visits as told by your health care provider. This is important. Medicines  Take over-the-counter and prescription medicines only as told by your health care provider. Follow directions carefully. Blood pressure medicines must be taken as prescribed.  Do not skip doses of blood pressure medicine. Doing this puts you at risk for problems and can make the medicine less effective.  Ask your health care provider about side effects or reactions to medicines that you should watch for. Contact a health care provider if:  You think you are having a reaction to a medicine you are taking.  You have headaches that keep coming back (recurring).  You feel dizzy.  You have swelling in your ankles.  You have trouble with your vision. Get help right away if:  You develop a severe headache or confusion.  You have unusual weakness or numbness.  You feel faint.  You have severe pain in your chest or abdomen.  You vomit repeatedly.  You have trouble breathing. Summary  Hypertension is when the  force of blood pumping through your arteries is too strong. If this condition is not controlled, it may put you at risk for serious complications.  Your personal target blood pressure may vary depending on your medical conditions, your age, and other factors. For most people, a normal blood pressure is less than 120/80.  Hypertension is treated with lifestyle changes, medicines, or a combination of both. Lifestyle changes include weight loss, eating a healthy, low-sodium diet, exercising more, and limiting alcohol. This information is not intended to replace advice given to you by your health care provider. Make sure you discuss any questions you have with your health care provider. Document Released: 05/20/2005 Document Revised: 04/17/2016 Document Reviewed: 04/17/2016 Elsevier Interactive Patient Education  2019 [employer].

## 2018-10-21 ENCOUNTER — Telehealth: Payer: Self-pay

## 2018-10-21 NOTE — Telephone Encounter (Signed)
-----   Message from Ladell Pier, MD sent at 10/20/2018  1:40 PM EDT ----- Please call patient, cea is normal

## 2018-10-21 NOTE — Telephone Encounter (Signed)
Left Vm msg to call back in regards to labs results.  Per Dr. Benay Spice cea is normal

## 2018-10-21 NOTE — Telephone Encounter (Signed)
Spoke with pt advised Per Dr. Benay Spice cea is normal. Pt verbalized understanding.

## 2018-12-23 ENCOUNTER — Ambulatory Visit: Payer: Self-pay | Admitting: *Deleted

## 2018-12-23 NOTE — Telephone Encounter (Signed)
Pt called in c/o chest pain and light headedness.   He also mentioned his BP being elevated for the last few weeks.  See triage notes.    He does not have a PCP.   He has a get established appt next Tuesday with Dr. Luetta Nutting.  I sent him to the ED per the Adult Chest Pain Protocol.   He is going to have his wife take him to [facility] ED.   It's close to him.     Reason for Disposition . Child sounds very sick or weak to the triager    Adult having chest pain.   Instructed to go to the ED.  Answer Assessment - Initial Assessment Questions 1. DESCRIPTION: "Describe your dizziness."     It's odd.   It's a general lightheaded feeling.   I've had colon CA.    Last 4-5 days have a aching pain in my chest almost constantly.   It's bad when I'm trying to go to sleep. 2 months ago BP 150/104.     I'm sitting now I can feel the chest pain and light headedness.     2. LIGHTHEADED: "Do you feel lightheaded?" (e.g., somewhat faint, woozy, weak upon standing)     Yes 3. VERTIGO: "Do you feel like either you or the room is spinning or tilting?" (i.e. vertigo)     No 4. SEVERITY: "How bad is it?"  "Do you feel like you are going to faint?" "Can you stand and walk?"   - MILD - walking normally   - MODERATE - interferes with normal activities (e.g., work, school)    - SEVERE - unable to stand, requires support to walk, feels like passing out now.      No 5. ONSET:  "When did the dizziness begin?"     Last 4-5 days ago 6. AGGRAVATING FACTORS: "Does anything make it worse?" (e.g., standing, change in head position)     No 7. HEART RATE: "Can you tell me your heart rate?" "How many beats in 15 seconds?"  (Note: not all patients can do this)       Not asked 8. CAUSE: "What do you think is causing the dizziness?"     I have anxiety regarding my health.    I don't think the chest pain is from anxiety because this has been non stop.  9. RECURRENT SYMPTOM: "Have you had dizziness before?"  If so, ask: "When was the last time?" "What happened that time?"     No 10. OTHER SYMPTOMS: "Do you have any other symptoms?" (e.g., fever, chest pain, vomiting, diarrhea, bleeding)       Chest pain, hypertension 11. PREGNANCY: "Is there any chance you are pregnant?" "When was your last menstrual period?"       N/A  Answer Assessment - Initial Assessment Questions 1. LOCATION: "Where does it hurt?"      I got this aching pain in the middle of my chest for the last 4-5 days that's been constant.   It's worse when I'm trying to go to sleep. 2. ONSET: "When did the chest pain start?" (Minutes, hours or days)      4-5 days ago 3. PATTERN: "Does the pain come and go, or is it constant?"      If constant: "Is it getting better, staying the same, or worsening?"      If intermittent: "How long does it last?"  "Does your child have the pain now?"       (  Note: serious pain is constant and usually progresses)      Constant sometimes vague but it's worse when I'm trying to go to sleep. 4. SEVERITY: "How bad is the pain?" "What does it keep your child from doing?"      - MILD:  doesn't interfere with normal activities      - MODERATE: interferes with normal activities or awakens from sleep      - SEVERE: excruciating pain, can't do any normal activities     No   I feel lightheaded. 5. RECURRENT SYMPTOM: "Has your child ever had chest pain before?" If so, ask: "When was the last time?" and "What happened that time?"      Adult-Never had chest pain before 6. CAUSE: "What do you think is causing the chest pain?"     I have anxiety regarding my health.   This has been going on too long to be anxiety. 7. COUGH: "Does your child have a cough?" If so, ask: "When did the cough start?"      N/A 8. WORK OR EXERCISE: "Has there been any recent work or exercise that involved the upper body?"      No 9. CHILD'S APPEARANCE: "How sick is your child acting?" " What is he doing right now?" If asleep, ask: "How was  he acting before he went to sleep?"     Adult  -  My BP has been elevated and I'm feeling light headed.  Protocols used: CHEST PAIN-P-AH, DIZZINESS - LIGHTHEADEDNESS-A-AH

## 2018-12-28 ENCOUNTER — Telehealth: Payer: Self-pay | Admitting: Family Medicine

## 2018-12-28 NOTE — Telephone Encounter (Signed)
Patient is calling back to do screening questions for his appt.

## 2018-12-29 ENCOUNTER — Other Ambulatory Visit: Payer: Self-pay

## 2018-12-29 ENCOUNTER — Encounter: Payer: Self-pay | Admitting: Family Medicine

## 2018-12-29 ENCOUNTER — Ambulatory Visit (INDEPENDENT_AMBULATORY_CARE_PROVIDER_SITE_OTHER): Payer: 59 | Admitting: Family Medicine

## 2018-12-29 ENCOUNTER — Telehealth: Payer: Self-pay

## 2018-12-29 VITALS — BP 164/122 | HR 93 | Temp 97.9°F | Resp 20 | Ht 70.25 in | Wt 197.6 lb

## 2018-12-29 DIAGNOSIS — C187 Malignant neoplasm of sigmoid colon: Secondary | ICD-10-CM

## 2018-12-29 DIAGNOSIS — F411 Generalized anxiety disorder: Secondary | ICD-10-CM | POA: Diagnosis not present

## 2018-12-29 DIAGNOSIS — I10 Essential (primary) hypertension: Secondary | ICD-10-CM

## 2018-12-29 MED ORDER — OLMESARTAN MEDOXOMIL 20 MG PO TABS: 20.0000 mg | ORAL_TABLET | Freq: Every day | ORAL | 1 refills | Status: DC

## 2018-12-29 NOTE — Assessment & Plan Note (Addendum)
-  BP remains elevated at today's visit even with recheck at end of appt (150/110)   -Some anxiety may be contributing as well.   -Will start benicar 20mg  daily with follow up in 6 weeks. -he will monitor BP at home occasionally and continue to work on sodium reduction.

## 2018-12-29 NOTE — Progress Notes (Addendum)
Charles Rowland - 43 y.o. male MRN [id number]  Date of birth: 06-Sep-1975  Subjective Chief Complaint  Patient presents with  . Establish Care    NP/ HTN    HPI Charles Rowland is a 43 y.o. male with history of colon cancer diagnosed last year s/p lap resection of bowel by Dr. Marlou Starks.  He has done well since that time.  He has concern of elevated blood pressure.  BP has been elevated on several occasions to visits at his oncologists and surgeons office.  He feels that anxiety may be contributing some as well.  He was seen at an urgent care last week for chest pain and diagnosed with costochondritis.  He was prescribed diclofenac and alprazolam with improvement of his symptoms.  BP remains high today.  He denies worsening chest pain, shortness of breath, headache or vision changes.  He reports that he has cut back on high salt foods over the past few weeks and is walking for exercise.    Depression screen [facility] 2/9 12/29/2018  Decreased Interest 0  Down, Depressed, Hopeless 0  PHQ - 2 Score 0  Altered sleeping 1  Tired, decreased energy 0  Change in appetite 0  Feeling bad or failure about yourself  0  Trouble concentrating 0  Moving slowly or fidgety/restless 1  Suicidal thoughts 0  PHQ-9 Score 2   GAD 7 : Generalized Anxiety Score 12/29/2018  Nervous, Anxious, on Edge 1  Control/stop worrying 1  Worry too much - different things 1  Trouble relaxing 1  Restless 1  Easily annoyed or irritable 0  Afraid - awful might happen 0  Total GAD 7 Score 5    ROS:  A comprehensive ROS was completed and negative except as noted per HPI  No Known Allergies  Past Medical History:  Diagnosis Date  . Cancer ([place])    Colon Dx 2019  . Family history of breast cancer     Past Surgical History:  Procedure Laterality Date  . BIOPSY  04/06/2018   Procedure: BIOPSY;  Surgeon: Ronnette Juniper, MD;  Location: [facility];  Service: Gastroenterology;;  . COLONOSCOPY WITH PROPOFOL N/A 04/06/2018   Procedure: COLONOSCOPY WITH PROPOFOL;  Surgeon: Ronnette Juniper, MD;  [facility];  Service: Gastroenterology;  Laterality: N/A;  . LAPAROSCOPIC SMALL BOWEL RESECTION N/A 04/08/2018   Procedure: LAPAROSCOPIC ASSISTED LOW ANTERIOR RESECTION;  Surgeon: Jovita Kussmaul, MD;  Location[facility];  Service: General;  Laterality: N/A;  . POLYPECTOMY  04/06/2018   Procedure: POLYPECTOMY;  Surgeon: Ronnette Juniper, MD;  [facility];  Service: Gastroenterology;;  . Lia Foyer INJECTION  04/06/2018   Procedure: SUBMUCOSAL INJECTION;  Surgeon: Ronnette Juniper, MD;  [facility];  Service: Gastroenterology;;    Social History   Socioeconomic History  . Marital status: Married    Spouse name: Not on file  . Number of children: Not on file  . Years of education: Not on file  . Highest education level: Not on file  Occupational History  . Not on file  Social Needs  . Financial resource strain: Not on file  . Food insecurity    Worry: Not on file    Inability: Not on file  . Transportation needs    Medical: Not on file    Non-medical: Not on file  Tobacco Use  . Smoking status: Never Smoker  . Smokeless tobacco: Never Used  Substance and Sexual Activity  . Alcohol use: Yes    Comment: occasional  .  Drug use: Never  . Sexual activity: Yes  Lifestyle  . Physical activity    Days per week: Not on file    Minutes per session: Not on file  . Stress: Not on file  Relationships  . Social Herbalist on phone: Not on file    Gets together: Not on file    Attends religious service: Not on file    Active member of club or organization: Not on file    Attends meetings of clubs or organizations: Not on file    Relationship status: Not on file  Other Topics Concern  . Not on file  Social History Narrative  . Not on file    Family History  Problem Relation Age of Onset  . Lung cancer Other   . Breast cancer Maternal Grandmother        d. 66  . Cancer Maternal  Grandfather        lung, smoker  . Hypertension Mother   . Hypertension Father   . Kidney disease Maternal Aunt   . CAD Neg Hx   . Stroke Neg Hx     Health Maintenance  Topic Date Due  . TETANUS/TDAP  04/10/1997  . INFLUENZA VACCINE  01/02/2019  . HIV Screening  Completed    ----------------------------------------------------------------------------------------------------------------------------------------------------------------------------------------------------------------- Physical Exam BP (!) 164/122   Pulse 93   Temp 97.9 F (36.6 C) (Oral)   Resp 20   Ht 5' 10.25" (1.784 m)   Wt 197 lb 9.6 oz (89.6 kg)   SpO2 98%   BMI 28.15 kg/m   Physical Exam Constitutional:      Appearance: Normal appearance.  HENT:     Head: Normocephalic and atraumatic.     Mouth/Throat:     Mouth: Mucous membranes are moist.  Eyes:     General: No scleral icterus. Cardiovascular:     Rate and Rhythm: Normal rate and regular rhythm.  Pulmonary:     Effort: Pulmonary effort is normal.     Breath sounds: Normal breath sounds.  Musculoskeletal:     Right lower leg: No edema.     Left lower leg: No edema.  Skin:    General: Skin is warm and dry.  Neurological:     General: No focal deficit present.     Mental Status: He is alert.  Psychiatric:        Mood and Affect: Mood normal.        Behavior: Behavior normal.     ------------------------------------------------------------------------------------------------------------------------------------------------------------------------------------------------------------------- Assessment and Plan  Essential hypertension -BP remains elevated at today's visit even with recheck at end of appt (150/110)   -Some anxiety may be contributing as well.   -Will start benicar 20mg  daily with follow up in 6 weeks. -he will monitor BP at home occasionally and continue to work on sodium reduction.   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 Has repeat colonoscopy this fall.   GAD (generalized anxiety disorder) Likely some anxiety contributing as he had some improvement with addition of alprazolam.

## 2018-12-29 NOTE — Telephone Encounter (Signed)
Questions for Screening COVID-19  Symptom onset:None, Prescreened, #2046 arrival given Travel or Contacts: None  During this illness, did/does the patient experience any of the following symptoms? Fever >100.27F []   Yes [x]   No []   Unknown Subjective fever (felt feverish) []   Yes [x]   No []   Unknown Chills []   Yes [x]   No []   Unknown Muscle aches (myalgia) []   Yes [x]   No []   Unknown Runny nose (rhinorrhea) []   Yes [x]   No []   Unknown Sore throat []   Yes [x]   No []   Unknown Cough (new onset or worsening of chronic cough) []   Yes [x]   No []   Unknown Shortness of breath (dyspnea) []   Yes [x]   No []   Unknown Nausea or vomiting []   Yes [x]   No []   Unknown Headache []   Yes [x]   No []   Unknown Abdominal pain  []   Yes [x]   No []   Unknown Diarrhea (?3 loose/looser than normal stools/24hr period) []   Yes [x]   No []   Unknown Other, specify:  Patient risk factors: Smoker? []   Current []   Former [x]   Never If male, currently pregnant? []   Yes [x]   No  Patient Active Problem List   Diagnosis Date Noted  . Genetic testing 05/21/2018  . Family history of breast cancer   . 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 Rectal bleed 04/05/2018  . Bright red blood per rectum 04/04/2018  . Lower GI bleed 04/04/2018    Plan:  []   High risk for COVID-19 with red flags go to ED (with CP, SOB, weak/lightheaded, or fever > 101.5). Call ahead.  []   High risk for COVID-19 but stable. Inform provider and coordinate time for [facility] visit.   [x]   No red flags but URI signs or symptoms okay for [facility] visit.

## 2018-12-29 NOTE — Patient Instructions (Signed)

## 2018-12-29 NOTE — Assessment & Plan Note (Signed)
Stable, continues to follow with oncology and Gen surgery. Has repeat colonoscopy this fall.

## 2018-12-29 NOTE — Telephone Encounter (Signed)
Pt prescreened at car/ seen at appointment.

## 2018-12-30 LAB — COMPREHENSIVE METABOLIC PANEL
AG Ratio: 1.4 (calc) (ref 1.0–2.5)
ALT: 14 U/L (ref 9–46)
AST: 19 U/L (ref 10–40)
Albumin: 4.9 g/dL (ref 3.6–5.1)
Alkaline phosphatase (APISO): 62 U/L (ref 36–130)
BUN: 16 mg/dL (ref 7–25)
CO2: 24 mmol/L (ref 20–32)
Calcium: 10.1 mg/dL (ref 8.6–10.3)
Chloride: 102 mmol/L (ref 98–110)
Creat: 1.11 mg/dL (ref 0.60–1.35)
Globulin: 3.4 g/dL (calc) (ref 1.9–3.7)
Glucose, Bld: 81 mg/dL (ref 65–99)
Potassium: 4.4 mmol/L (ref 3.5–5.3)
Sodium: 138 mmol/L (ref 135–146)
Total Bilirubin: 0.5 mg/dL (ref 0.2–1.2)
Total Protein: 8.3 g/dL — ABNORMAL HIGH (ref 6.1–8.1)

## 2018-12-30 LAB — CBC WITH DIFFERENTIAL/PLATELET
Absolute Monocytes: 459 cells/uL (ref 200–950)
Basophils Absolute: 21 cells/uL (ref 0–200)
Basophils Relative: 0.5 %
Eosinophils Absolute: 111 cells/uL (ref 15–500)
Eosinophils Relative: 2.7 %
HCT: 45.2 % (ref 38.5–50.0)
Hemoglobin: 15.7 g/dL (ref 13.2–17.1)
Lymphs Abs: 1394 cells/uL (ref 850–3900)
MCH: 30.4 pg (ref 27.0–33.0)
MCHC: 34.7 g/dL (ref 32.0–36.0)
MCV: 87.4 fL (ref 80.0–100.0)
MPV: 11 fL (ref 7.5–12.5)
Monocytes Relative: 11.2 %
Neutro Abs: 2116 cells/uL (ref 1500–7800)
Neutrophils Relative %: 51.6 %
Platelets: 145 10*3/uL (ref 140–400)
RBC: 5.17 10*6/uL (ref 4.20–5.80)
RDW: 13.5 % (ref 11.0–15.0)
Total Lymphocyte: 34 %
WBC: 4.1 10*3/uL (ref 3.8–10.8)

## 2018-12-30 LAB — TSH: TSH: 3.88 mIU/L (ref 0.40–4.50)

## 2019-01-19 DIAGNOSIS — F411 Generalized anxiety disorder: Secondary | ICD-10-CM | POA: Insufficient documentation

## 2019-01-19 NOTE — Assessment & Plan Note (Signed)
Likely some anxiety contributing as he had some improvement with addition of alprazolam.

## 2019-02-01 ENCOUNTER — Encounter: Payer: Self-pay | Admitting: Oncology

## 2019-02-05 ENCOUNTER — Telehealth: Payer: Self-pay

## 2019-02-05 NOTE — Telephone Encounter (Signed)
Questions for Screening COVID-19  Symptom onset: None  Travel or Contacts: None  During this illness, did/does the patient experience any of the following symptoms? Fever >100.78F []   Yes [x]   No []   Unknown Subjective fever (felt feverish) []   Yes [x]   No []   Unknown Chills []   Yes [x]   No []   Unknown Muscle aches (myalgia) []   Yes [x]   No []   Unknown Runny nose (rhinorrhea) []   Yes [x]   No []   Unknown Sore throat []   Yes [x]   No []   Unknown Cough (new onset or worsening of chronic cough) []   Yes [x]   No []   Unknown Shortness of breath (dyspnea) []   Yes [x]   No []   Unknown Nausea or vomiting []   Yes [x]   No []   Unknown Headache []   Yes [x]   No []   Unknown Abdominal pain  []   Yes [x]   No []   Unknown Diarrhea (?3 loose/looser than normal stools/24hr period) []   Yes [x]   No []   Unknown Other, specify:  Patient risk factors: Smoker? []   Current []   Former []   Never If male, currently pregnant? []   Yes []   No  Patient Active Problem List   Diagnosis Date Noted  . GAD (generalized anxiety disorder) 01/19/2019  . Essential hypertension 12/29/2018  . Genetic testing 05/21/2018  . Family history of breast cancer   . 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 Rectal bleed 04/05/2018  . Bright red blood per rectum 04/04/2018  . Lower GI bleed 04/04/2018    Plan:  []   High risk for COVID-19 with red flags go to ED (with CP, SOB, weak/lightheaded, or fever > 101.5). Call ahead.  []   High risk for COVID-19 but stable. Inform provider and coordinate time for [facility] visit.   []   No red flags but URI signs or symptoms okay for [facility] visit.

## 2019-02-09 ENCOUNTER — Ambulatory Visit (INDEPENDENT_AMBULATORY_CARE_PROVIDER_SITE_OTHER): Payer: 59 | Admitting: Family Medicine

## 2019-02-09 ENCOUNTER — Other Ambulatory Visit: Payer: Self-pay

## 2019-02-09 ENCOUNTER — Encounter: Payer: Self-pay | Admitting: Family Medicine

## 2019-02-09 DIAGNOSIS — I10 Essential (primary) hypertension: Secondary | ICD-10-CM

## 2019-02-09 MED ORDER — OLMESARTAN MEDOXOMIL 20 MG PO TABS: 20.0000 mg | ORAL_TABLET | Freq: Every day | ORAL | 2 refills | Status: DC

## 2019-02-09 NOTE — Assessment & Plan Note (Signed)
-  BP much better controlled today.  Recommend continuation of benicar at current dose.   -F/u in 6 months or sooner if needed.

## 2019-02-09 NOTE — Progress Notes (Signed)
Charles Rowland - 43 y.o. male MRN [id number]  Date of birth: 1976-01-21  Subjective Chief Complaint  Patient presents with  . Follow-up    Patient is here today for a 6-week-F/U for Hypertension. Pt declines immunizations and will get at [facility]. He is not fasting.    HPI Charles Rowland is a 43 y.o. male with history of HTN and colon cancer here today for follow up of HTN.  He reports that he is doing well with benicar 20mg .  His chest pain he had previously has resolved.  Reports some anxiety surrounding his job that may be contributing to his BP issues.  He has not checked his BP at home recently.  He denies headache, vision changes, chest pain, shortness of breath, or palpitations.    ROS:  A comprehensive ROS was completed and negative except as noted per HPI  No Known Allergies  Past Medical History:  Diagnosis Date  . Cancer ([place])    Colon Dx 2019  . Family history of breast cancer     Past Surgical History:  Procedure Laterality Date  . BIOPSY  04/06/2018   Procedure: BIOPSY;  Surgeon: Ronnette Juniper, MD;  [facility];  Service: Gastroenterology;;  . COLONOSCOPY WITH PROPOFOL N/A 04/06/2018   Procedure: COLONOSCOPY WITH PROPOFOL;  Surgeon: Ronnette Juniper, MD;  [facility];  Service: Gastroenterology;  Laterality: N/A;  . LAPAROSCOPIC SMALL BOWEL RESECTION N/A 04/08/2018   Procedure: LAPAROSCOPIC ASSISTED LOW ANTERIOR RESECTION;  Surgeon: Jovita Kussmaul, MD;  [facility];  Service: General;  Laterality: N/A;  . POLYPECTOMY  04/06/2018   Procedure: POLYPECTOMY;  Surgeon: Ronnette Juniper, MD;  [facility];  Service: Gastroenterology;;  . Lia Foyer INJECTION  04/06/2018   Procedure: SUBMUCOSAL INJECTION;  Surgeon: Ronnette Juniper, MD;  [facility];  Service: Gastroenterology;;    Social History   Socioeconomic History  . Marital status: Married    Spouse name: Not on file  . Number of children: Not on file  . Years of education: Not on  file  . Highest education level: Not on file  Occupational History  . Not on file  Social Needs  . Financial resource strain: Not on file  . Food insecurity    Worry: Not on file    Inability: Not on file  . Transportation needs    Medical: Not on file    Non-medical: Not on file  Tobacco Use  . Smoking status: Never Smoker  . Smokeless tobacco: Never Used  Substance and Sexual Activity  . Alcohol use: Yes    Comment: occasional  . Drug use: Never  . Sexual activity: Yes  Lifestyle  . Physical activity    Days per week: Not on file    Minutes per session: Not on file  . Stress: Not on file  Relationships  . Social Herbalist on phone: Not on file    Gets together: Not on file    Attends religious service: Not on file    Active member of club or organization: Not on file    Attends meetings of clubs or organizations: Not on file    Relationship status: Not on file  Other Topics Concern  . Not on file  Social History Narrative  . Not on file    Family History  Problem Relation Age of Onset  . Lung cancer Other   . Breast cancer Maternal Grandmother        d. 65  .  Cancer Maternal Grandfather        lung, smoker  . Hypertension Mother   . Hypertension Father   . Kidney disease Maternal Aunt   . CAD Neg Hx   . Stroke Neg Hx     Health Maintenance  Topic Date Due  . INFLUENZA VACCINE  02/16/2019 (Originally 01/02/2019)  . TETANUS/TDAP  02/16/2019 (Originally 04/10/1997)  . HIV Screening  Completed    ----------------------------------------------------------------------------------------------------------------------------------------------------------------------------------------------------------------- Physical Exam BP 138/86   Pulse 78   Temp 97.9 F (36.6 C) (Oral)   Ht 5' 10.25" (1.784 m)   Wt 196 lb 12.8 oz (89.3 kg)   SpO2 98%   BMI 28.04 kg/m   Physical Exam Constitutional:      Appearance: Normal appearance.  HENT:     Head:  Normocephalic and atraumatic.  Eyes:     General: No scleral icterus. Neck:     Musculoskeletal: Neck supple.  Cardiovascular:     Rate and Rhythm: Normal rate and regular rhythm.  Pulmonary:     Effort: Pulmonary effort is normal.     Breath sounds: Normal breath sounds.  Skin:    General: Skin is warm and dry.  Neurological:     General: No focal deficit present.     Mental Status: He is alert.  Psychiatric:        Mood and Affect: Mood normal.        Behavior: Behavior normal.     ------------------------------------------------------------------------------------------------------------------------------------------------------------------------------------------------------------------- Assessment and Plan  Essential hypertension -BP much better controlled today.  Recommend continuation of benicar at current dose.   -F/u in 6 months or sooner if needed.

## 2019-03-18 ENCOUNTER — Telehealth: Payer: Self-pay | Admitting: Oncology

## 2019-03-18 ENCOUNTER — Encounter: Payer: Self-pay | Admitting: Oncology

## 2019-03-18 NOTE — Telephone Encounter (Signed)
R/s appt per 10/14 sch message - pt is aware of new appt date and time

## 2019-03-19 ENCOUNTER — Encounter: Payer: Self-pay | Admitting: Oncology

## 2019-03-19 NOTE — Progress Notes (Signed)
Returned call to patient whom left message with insurance questions.  Introduced myself as Arboriculturist. Patient had concerns regarding insurance coverage ending 11/7 and coverage for appointments. He states he did speak with his insurance company and verified that his upcoming appointment would be covered since it was moved. They advised him it would be. He had questions regarding affordable care if he decides not to go with COBRA. Advised him to contact Affordable Care with any questions to receive accurate information.  I did advise if something happened which causes him to become uninsured, he would automatically receive a 56% discount with the opportunity to apply for additional discount with supporting documentation. He verbalized understanding and was very Patent attorney.  I will provide my card at his next visit.

## 2019-04-09 ENCOUNTER — Telehealth: Payer: Self-pay | Admitting: *Deleted

## 2019-04-09 ENCOUNTER — Inpatient Hospital Stay: Payer: 59 | Attending: Oncology

## 2019-04-09 ENCOUNTER — Inpatient Hospital Stay (HOSPITAL_BASED_OUTPATIENT_CLINIC_OR_DEPARTMENT_OTHER): Payer: 59 | Admitting: Oncology

## 2019-04-09 ENCOUNTER — Other Ambulatory Visit: Payer: Self-pay

## 2019-04-09 VITALS — BP 131/96 | HR 83 | Temp 98.0°F | Resp 17 | Ht 70.25 in | Wt 193.3 lb

## 2019-04-09 DIAGNOSIS — D508 Other iron deficiency anemias: Secondary | ICD-10-CM | POA: Insufficient documentation

## 2019-04-09 DIAGNOSIS — I10 Essential (primary) hypertension: Secondary | ICD-10-CM | POA: Diagnosis not present

## 2019-04-09 DIAGNOSIS — Z79899 Other long term (current) drug therapy: Secondary | ICD-10-CM | POA: Insufficient documentation

## 2019-04-09 DIAGNOSIS — Z85038 Personal history of other malignant neoplasm of large intestine: Secondary | ICD-10-CM | POA: Diagnosis present

## 2019-04-09 DIAGNOSIS — C187 Malignant neoplasm of sigmoid colon: Secondary | ICD-10-CM

## 2019-04-09 LAB — CEA (IN HOUSE-CHCC): CEA (CHCC-In House): 3.34 ng/mL (ref 0.00–5.00)

## 2019-04-09 NOTE — Progress Notes (Signed)
Message left w/[facility] requesting October 2020 colonoscopy report and any pathology obtained to fax # [phone]

## 2019-04-09 NOTE — Telephone Encounter (Signed)
-----   Message from Ladell Pier, MD sent at 04/09/2019 12:52 PM EST ----- Please call patient, CEA is normal, follow-up as scheduled, colonoscopy biopsy was benign

## 2019-04-09 NOTE — Telephone Encounter (Signed)
Notified of normal CEA and that colonoscopy path was benign.

## 2019-04-09 NOTE — Progress Notes (Signed)
  [facility] OFFICE PROGRESS NOTE   Diagnosis: Colon cancer  INTERVAL HISTORY:   Mr. Hari returns as scheduled.  He feels well.  No difficulty with bowel function.  He is exercising.  He underwent colonoscopy 2 weeks ago by Dr Therisa Doyne.  He reports 1 polyp was removed.  We do not have the report available today.  He is now on an antihypertensive.  Objective:  Vital signs in last 24 hours:  Blood pressure (!) 131/96, pulse 83, temperature 98 F (36.7 C), temperature source Temporal, resp. rate 17, height 5' 10.25" (1.784 m), weight 193 lb 4.8 oz (87.7 kg), SpO2 99 %.    HEENT: Neck without mass Lymphatics: No cervical, supraclavicular, axillary, or inguinal nodes GI: No hepatosplenomegaly, no mass, nontender Vascular: No leg edema   Lab Results:   Lab Results  Component Value Date   CEA1 3.15 10/20/2018    Medications: I have reviewed the patient's current medications.   Assessment/Plan: 1.  Adenocarcinoma of sigmoid colon, pT3N0M0, G2, stage IIA, MSI-Stable 2.  History of iron deficiency anemia secondary to GI blood loss and tumor bleeding  3.  Left leg pain December 2019-negative Doppler, resolved 4.  Hypertension  Disposition: [name] is in remission from colon cancer.  We will follow up on the CEA from today.  He will return for an office visit and CEA in 6 months.  We will follow-up on the colonoscopy report from October.  Betsy Coder, MD  04/09/2019  9:47 AM

## 2019-04-12 ENCOUNTER — Telehealth: Payer: Self-pay | Admitting: Oncology

## 2019-04-12 NOTE — Telephone Encounter (Signed)
Scheduled per los. Called and left msg. Mailed printout  °

## 2019-04-22 ENCOUNTER — Ambulatory Visit: Payer: 59 | Admitting: Oncology

## 2019-04-22 ENCOUNTER — Other Ambulatory Visit: Payer: 59

## 2019-05-26 ENCOUNTER — Telehealth: Payer: Self-pay | Admitting: Family Medicine

## 2019-05-26 MED ORDER — OLMESARTAN MEDOXOMIL 20 MG PO TABS: 20.0000 mg | ORAL_TABLET | Freq: Every day | ORAL | 2 refills | Status: DC

## 2019-05-26 NOTE — Telephone Encounter (Signed)
Medication Refill - Medication: olmesartan (BENICAR) 20 MG tablet   Pt is having some issues with insurance and is requesting refill for 1 month and he would like to know if there are any discount cards or codes for rx. He is okay with filling rx at a different pharmacy if that will allow a discount. A 90 day supply is roughly $500. Reqeusting CB.  Has the patient contacted their pharmacy? Yes.   (Agent: If no, request that the patient contact the pharmacy for the refill.) (Agent: If yes, when and what did the pharmacy advise?)  Preferred Pharmacy (with phone number or street name):  [facility] [id number] [address], [place] [address] Phone:  [phone]  Fax:  [phone]       Agent: Please be advised that RX refills may take up to 3 business days. We ask that you follow-up with your pharmacy.

## 2019-05-27 ENCOUNTER — Other Ambulatory Visit: Payer: Self-pay | Admitting: *Deleted

## 2019-05-27 MED ORDER — OLMESARTAN MEDOXOMIL 20 MG PO TABS: 20.0000 mg | ORAL_TABLET | Freq: Every day | ORAL | 2 refills | Status: DC

## 2019-05-27 NOTE — Telephone Encounter (Signed)
Medication resent to [facility] as requested.

## 2019-05-27 NOTE — Telephone Encounter (Signed)
Patient is requesting if this prescription could be sent to [facility] on [address]. Due to insurance loss.

## 2019-05-31 ENCOUNTER — Other Ambulatory Visit: Payer: Self-pay

## 2019-05-31 DIAGNOSIS — I10 Essential (primary) hypertension: Secondary | ICD-10-CM

## 2019-05-31 MED ORDER — OLMESARTAN MEDOXOMIL 20 MG PO TABS: 20.0000 mg | ORAL_TABLET | Freq: Every day | ORAL | 2 refills | Status: DC

## 2019-05-31 NOTE — Telephone Encounter (Signed)
Pt stated he contacted [facility] regarding the Rx and he was told that they have not received the Rx request. Pt requests that the Rx be sent to [facility] so he can go pick it up

## 2019-05-31 NOTE — Telephone Encounter (Signed)
Rx sent 

## 2019-07-05 ENCOUNTER — Encounter: Payer: Self-pay | Admitting: Oncology

## 2019-08-09 ENCOUNTER — Ambulatory Visit: Payer: 59 | Admitting: Family Medicine

## 2019-08-12 ENCOUNTER — Telehealth: Payer: Self-pay | Admitting: Family Medicine

## 2019-08-12 NOTE — Telephone Encounter (Signed)
Tried to call Charles Rowland to see if he is going to be following Dr Zigmund Daniel to new location or transferring to another provider, LVM.

## 2019-09-07 ENCOUNTER — Telehealth: Payer: Self-pay | Admitting: General Practice

## 2019-09-07 NOTE — Telephone Encounter (Signed)
Patient is requesting a refill for olmesartan sent to [place]. Pt has a TOC with Dr. Ethelene Hal on 4/20. CB is [phone]

## 2019-09-08 ENCOUNTER — Other Ambulatory Visit: Payer: Self-pay

## 2019-09-08 DIAGNOSIS — I10 Essential (primary) hypertension: Secondary | ICD-10-CM

## 2019-09-08 MED ORDER — OLMESARTAN MEDOXOMIL 20 MG PO TABS: 20.0000 mg | ORAL_TABLET | Freq: Every day | ORAL | 0 refills | Status: DC

## 2019-09-08 NOTE — Telephone Encounter (Signed)
Rx sent 

## 2019-09-08 NOTE — Telephone Encounter (Signed)
Last fill 05/31/19 Last OV 02/09/19 Next OV 09/21/19

## 2019-09-20 ENCOUNTER — Other Ambulatory Visit: Payer: Self-pay

## 2019-09-21 ENCOUNTER — Encounter: Payer: Self-pay | Admitting: Family Medicine

## 2019-09-21 ENCOUNTER — Ambulatory Visit (INDEPENDENT_AMBULATORY_CARE_PROVIDER_SITE_OTHER): Payer: 59 | Admitting: Family Medicine

## 2019-09-21 VITALS — BP 158/96 | HR 87 | Temp 97.4°F | Ht 70.0 in | Wt 206.0 lb

## 2019-09-21 DIAGNOSIS — Z0000 Encounter for general adult medical examination without abnormal findings: Secondary | ICD-10-CM | POA: Diagnosis not present

## 2019-09-21 DIAGNOSIS — I10 Essential (primary) hypertension: Secondary | ICD-10-CM | POA: Diagnosis not present

## 2019-09-21 MED ORDER — OLMESARTAN MEDOXOMIL 40 MG PO TABS: 40.0000 mg | ORAL_TABLET | Freq: Every day | ORAL | 1 refills | Status: DC

## 2019-09-21 NOTE — Progress Notes (Addendum)
Acute Office Visit  Subjective:    Patient ID: Charles Rowland, male    DOB: 1976/04/19, 44 y.o.   MRN: [id number]  Chief Complaint  Patient presents with  . Transitions Of Care    toc from Dr. Zigmund Daniel follow up on BP.     HPI Patient is in today for a physical exam and follow-up of his hypertension.  Review of the chart shows elevated blood pressures in multiple encounters.  Patient does admit that he feels as though his pressure goes up when he sees a physician.  He is not checking his blood pressure at home.  He is leading a healthy lifestyle with regular exercise low-sodium dietary choices.  He is between jobs right now that of course is leading to some stress.  He hopes to be rerehired with his former employer.  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  Was resected back in 2019.  Continues to follow-up with his surgeon.  He does not smoke or use illicit drugs.  He drinks on rare occasion.   Past Medical History:  Diagnosis Date  . Cancer ([place])    Colon Dx 2019  . Family history of breast cancer     Past Surgical History:  Procedure Laterality Date  . BIOPSY  04/06/2018   Procedure: BIOPSY;  Surgeon: Ronnette Juniper, MD;  Location: [facility];  Service: Gastroenterology;;  . COLONOSCOPY WITH PROPOFOL N/A 04/06/2018   Procedure: COLONOSCOPY WITH PROPOFOL;  Surgeon: Ronnette Juniper, MD;  Location: [facility];  Service: Gastroenterology;  Laterality: N/A;  . LAPAROSCOPIC SMALL BOWEL RESECTION N/A 04/08/2018   Procedure: LAPAROSCOPIC ASSISTED LOW ANTERIOR RESECTION;  Surgeon: Jovita Kussmaul, MD;  Location: [facility];  Service: General;  Laterality: N/A;  . POLYPECTOMY  04/06/2018   Procedure: POLYPECTOMY;  Surgeon: Ronnette Juniper, MD;  Location: [facility];  Service: Gastroenterology;;  . Lia Foyer INJECTION  04/06/2018   Procedure: SUBMUCOSAL INJECTION;  Surgeon: Ronnette Juniper, MD;  Location: [facility];  Service: Gastroenterology;;    Family History    Problem Relation Age of Onset  . Lung cancer Other   . Breast cancer Maternal Grandmother        d. 55  . Cancer Maternal Grandfather        lung, smoker  . Hypertension Mother   . Hypertension Father   . Kidney disease Maternal Aunt   . CAD Neg Hx   . Stroke Neg Hx     Social History   Socioeconomic History  . Marital status: Married    Spouse name: Not on file  . Number of children: Not on file  . Years of education: Not on file  . Highest education level: Not on file  Occupational History  . Not on file  Tobacco Use  . Smoking status: Never Smoker  . Smokeless tobacco: Never Used  Substance and Sexual Activity  . Alcohol use: Yes    Comment: occasional  . Drug use: Never  . Sexual activity: Yes  Other Topics Concern  . Not on file  Social History Narrative  . Not on file   Social Determinants of Health   Financial Resource Strain:   . Difficulty of Paying Living Expenses:   Food Insecurity:   . Worried About Charity fundraiser in the Last Year:   . Arboriculturist in the Last Year:   Transportation Needs:   . Film/video editor (Medical):   Marland Kitchen Lack of Transportation (Non-Medical):   Physical  Activity:   . Days of Exercise per Week:   . Minutes of Exercise per Session:   Stress:   . Feeling of Stress :   Social Connections:   . Frequency of Communication with Friends and Family:   . Frequency of Social Gatherings with Friends and Family:   . Attends Religious Services:   . Active Member of Clubs or Organizations:   . Attends Archivist Meetings:   Marland Kitchen Marital Status:   Intimate Partner Violence:   . Fear of Current or Ex-Partner:   . Emotionally Abused:   Marland Kitchen Physically Abused:   . Sexually Abused:     Outpatient Medications Prior to Visit  Medication Sig Dispense Refill  . Lactobacillus (PROBIOTIC ACIDOPHILUS PO) Take 1 tablet by mouth daily.    . psyllium (REGULOID) 0.52 g capsule Take 0.52 g by mouth daily.    Marland Kitchen VITAMIN D,  CHOLECALCIFEROL, PO Take 50 mcg by mouth daily.     Marland Kitchen olmesartan (BENICAR) 20 MG tablet Take 1 tablet (20 mg total) by mouth daily. 90 tablet 0  . ALPRAZolam (XANAX) 0.25 MG tablet      No facility-administered medications prior to visit.    No Known Allergies  Review of Systems  Constitutional: Negative.   HENT: Negative.   Eyes: Negative for photophobia and visual disturbance.  Respiratory: Negative.   Cardiovascular: Negative.   Gastrointestinal: Negative.   Genitourinary: Negative.   Musculoskeletal: Negative for gait problem and joint swelling.  Allergic/Immunologic: Negative for immunocompromised state.  Neurological: Negative for tremors and speech difficulty.  Hematological: Does not bruise/bleed easily.  Psychiatric/Behavioral: Negative.        Depression screen [facility] 2/9 09/21/2019 12/29/2018  Decreased Interest 0 0  Down, Depressed, Hopeless 0 0  PHQ - 2 Score 0 0  Altered sleeping - 1  Tired, decreased energy - 0  Change in appetite - 0  Feeling bad or failure about yourself  - 0  Trouble concentrating - 0  Moving slowly or fidgety/restless - 1  Suicidal thoughts - 0  PHQ-9 Score - 2    Objective:    Physical Exam Constitutional:      General: He is not in acute distress.    Appearance: Normal appearance. He is not ill-appearing, toxic-appearing or diaphoretic.  HENT:     Head: Normocephalic and atraumatic.     Right Ear: Tympanic membrane, ear canal and external ear normal.     Left Ear: Tympanic membrane, ear canal and external ear normal.  Eyes:     Extraocular Movements: Extraocular movements intact.     Conjunctiva/sclera: Conjunctivae normal.     Pupils: Pupils are equal, round, and reactive to light.  Cardiovascular:     Rate and Rhythm: Normal rate and regular rhythm.  Pulmonary:     Effort: Pulmonary effort is normal.     Breath sounds: Normal breath sounds.  Abdominal:     General: Abdomen is flat. Bowel sounds are normal. There is no  distension.     Palpations: Abdomen is soft. There is no mass.     Tenderness: There is no abdominal tenderness. There is no guarding or rebound.     Hernia: No hernia is present. There is no hernia in the left inguinal area or right inguinal area.  Genitourinary:    Penis: Circumcised. No hypospadias, erythema, tenderness, discharge, swelling or lesions.      Testes:        Right: Mass, tenderness or swelling not present.  Left: Mass, tenderness or swelling not present.  Musculoskeletal:     Cervical back: No tenderness.     Right lower leg: No edema.     Left lower leg: No edema.  Lymphadenopathy:     Cervical: No cervical adenopathy.     Lower Body: No right inguinal adenopathy. No left inguinal adenopathy.  Neurological:     Mental Status: He is alert and oriented to person, place, and time.  Psychiatric:        Mood and Affect: Mood normal.        Behavior: Behavior normal.     BP (!) 158/96   Pulse 87   Temp (!) 97.4 F (36.3 C) (Tympanic)   Ht 5\' 10"  (1.778 m)   Wt 206 lb (93.4 kg)   SpO2 96%   BMI 29.56 kg/m  Wt Readings from Last 3 Encounters:  09/21/19 206 lb (93.4 kg)  04/09/19 193 lb 4.8 oz (87.7 kg)  02/09/19 196 lb 12.8 oz (89.3 kg)    There are no preventive care reminders to display for this patient.  There are no preventive care reminders to display for this patient.   Lab Results  Component Value Date   TSH 3.88 12/29/2018   Lab Results  Component Value Date   WBC 4.1 12/29/2018   HGB 15.7 12/29/2018   HCT 45.2 12/29/2018   MCV 87.4 12/29/2018   PLT 145 12/29/2018   Lab Results  Component Value Date   NA 138 12/29/2018   K 4.4 12/29/2018   CO2 24 12/29/2018   GLUCOSE 81 12/29/2018   BUN 16 12/29/2018   CREATININE 1.11 12/29/2018   BILITOT 0.5 12/29/2018   ALKPHOS 42 04/05/2018   AST 19 12/29/2018   ALT 14 12/29/2018   PROT 8.3 (H) 12/29/2018   ALBUMIN 3.4 (L) 04/05/2018   CALCIUM 10.1 12/29/2018   ANIONGAP 9 04/09/2018    No results found for: CHOL No results found for: HDL No results found for: LDLCALC No results found for: TRIG No results found for: CHOLHDL Lab Results  Component Value Date   HGBA1C 4.9 04/07/2018       Assessment & Plan:   Problem List Items Addressed This Visit      Cardiovascular and Mediastinum   Essential hypertension - Primary   Relevant Medications   olmesartan (BENICAR) 40 MG tablet   Other Relevant Orders   CBC   Comprehensive metabolic panel    Other Visit Diagnoses    Healthcare maintenance       Relevant Orders   CBC   Comprehensive metabolic panel   Lipid panel   Urinalysis, Routine w reflex microscopic       Meds ordered this encounter  Medications  . olmesartan (BENICAR) 40 MG tablet    Sig: Take 1 tablet (40 mg total) by mouth daily.    Dispense:  100 tablet    Refill:  1   Have increase Benicar to 40 mg daily.  Patient will look out for lightheadedness especially when standing.  Patient was given information on managing hypertension.  He will continue with his healthy lifestyle and eating choices.  We will continue exercising.  He was given information on health maintenance and disease prevention.  Follow-up in 3 months.  Libby Maw, MD

## 2019-09-21 NOTE — Patient Instructions (Signed)
Hypertension, Adult High blood pressure (hypertension) is when the force of blood pumping through the arteries is too strong. The arteries are the blood vessels that carry blood from the heart throughout the body. Hypertension forces the heart to work harder to pump blood and may cause arteries to become narrow or stiff. Untreated or uncontrolled hypertension can cause a heart attack, heart failure, a stroke, kidney disease, and other problems. A blood pressure reading consists of a higher number over a lower number. Ideally, your blood pressure should be below 120/80. The first ("top") number is called the systolic pressure. It is a measure of the pressure in your arteries as your heart beats. The second ("bottom") number is called the diastolic pressure. It is a measure of the pressure in your arteries as the heart relaxes. What are the causes? The exact cause of this condition is not known. There are some conditions that result in or are related to high blood pressure. What increases the risk? Some risk factors for high blood pressure are under your control. The following factors may make you more likely to develop this condition:  Smoking.  Having type 2 diabetes mellitus, high cholesterol, or both.  Not getting enough exercise or physical activity.  Being overweight.  Having too much fat, sugar, calories, or salt (sodium) in your diet.  Drinking too much alcohol. Some risk factors for high blood pressure may be difficult or impossible to change. Some of these factors include:  Having chronic kidney disease.  Having a family history of high blood pressure.  Age. Risk increases with age.  Race. You may be at higher risk if you are African American.  Gender. Men are at higher risk than women before age 45. After age 65, women are at higher risk than men.  Having obstructive sleep apnea.  Stress. What are the signs or symptoms? High blood pressure may not cause symptoms. Very high  blood pressure (hypertensive crisis) may cause:  Headache.  Anxiety.  Shortness of breath.  Nosebleed.  Nausea and vomiting.  Vision changes.  Severe chest pain.  Seizures. How is this diagnosed? This condition is diagnosed by measuring your blood pressure while you are seated, with your arm resting on a flat surface, your legs uncrossed, and your feet flat on the floor. The cuff of the blood pressure monitor will be placed directly against the skin of your upper arm at the level of your heart. It should be measured at least twice using the same arm. Certain conditions can cause a difference in blood pressure between your right and left arms. Certain factors can cause blood pressure readings to be lower or higher than normal for a short period of time:  When your blood pressure is higher when you are in a health care provider's office than when you are at home, this is called white coat hypertension. Most people with this condition do not need medicines.  When your blood pressure is higher at home than when you are in a health care provider's office, this is called masked hypertension. Most people with this condition may need medicines to control blood pressure. If you have a high blood pressure reading during one visit or you have normal blood pressure with other risk factors, you may be asked to:  Return on a different day to have your blood pressure checked again.  Monitor your blood pressure at home for 1 week or longer. If you are diagnosed with hypertension, you may have other blood or   imaging tests to help your health care provider understand your overall risk for other conditions. How is this treated? This condition is treated by making healthy lifestyle changes, such as eating healthy foods, exercising more, and reducing your alcohol intake. Your health care provider may prescribe medicine if lifestyle changes are not enough to get your blood pressure under control, and  if:  Your systolic blood pressure is above 130.  Your diastolic blood pressure is above 80. Your personal target blood pressure may vary depending on your medical conditions, your age, and other factors. Follow these instructions at home: Eating and drinking   Eat a diet that is high in fiber and potassium, and low in sodium, added sugar, and fat. An example eating plan is called the DASH (Dietary Approaches to Stop Hypertension) diet. To eat this way: ? Eat plenty of fresh fruits and vegetables. Try to fill one half of your plate at each meal with fruits and vegetables. ? Eat whole grains, such as whole-wheat pasta, brown rice, or whole-grain bread. Fill about one fourth of your plate with whole grains. ? Eat or drink low-fat dairy products, such as skim milk or low-fat yogurt. ? Avoid fatty cuts of meat, processed or cured meats, and poultry with skin. Fill about one fourth of your plate with lean proteins, such as fish, chicken without skin, beans, eggs, or tofu. ? Avoid pre-made and processed foods. These tend to be higher in sodium, added sugar, and fat.  Reduce your daily sodium intake. Most people with hypertension should eat less than 1,500 mg of sodium a day.  Do not drink alcohol if: ? Your health care provider tells you not to drink. ? You are pregnant, may be pregnant, or are planning to become pregnant.  If you drink alcohol: ? Limit how much you use to:  0-1 drink a day for women.  0-2 drinks a day for men. ? Be aware of how much alcohol is in your drink. In the [place] one drink equals one 12 oz bottle of beer (355 mL), one 5 oz glass of wine (148 mL), or one 1 oz glass of hard liquor (44 mL). Lifestyle   Work with your health care provider to maintain a healthy body weight or to lose weight. Ask what an ideal weight is for you.  Get at least 30 minutes of exercise most days of the week. Activities may include walking, swimming, or biking.  Include exercise to  strengthen your muscles (resistance exercise), such as Pilates or lifting weights, as part of your weekly exercise routine. Try to do these types of exercises for 30 minutes at least 3 days a week.  Do not use any products that contain nicotine or tobacco, such as cigarettes, e-cigarettes, and chewing tobacco. If you need help quitting, ask your health care provider.  Monitor your blood pressure at home as told by your health care provider.  Keep all follow-up visits as told by your health care provider. This is important. Medicines  Take over-the-counter and prescription medicines only as told by your health care provider. Follow directions carefully. Blood pressure medicines must be taken as prescribed.  Do not skip doses of blood pressure medicine. Doing this puts you at risk for problems and can make the medicine less effective.  Ask your health care provider about side effects or reactions to medicines that you should watch for. Contact a health care provider if you:  Think you are having a reaction to a medicine you  are taking.  Have headaches that keep coming back (recurring).  Feel dizzy.  Have swelling in your ankles.  Have trouble with your vision. Get help right away if you:  Develop a severe headache or confusion.  Have unusual weakness or numbness.  Feel faint.  Have severe pain in your chest or abdomen.  Vomit repeatedly.  Have trouble breathing. Summary  Hypertension is when the force of blood pumping through your arteries is too strong. If this condition is not controlled, it may put you at risk for serious complications.  Your personal target blood pressure may vary depending on your medical conditions, your age, and other factors. For most people, a normal blood pressure is less than 120/80.  Hypertension is treated with lifestyle changes, medicines, or a combination of both. Lifestyle changes include losing weight, eating a healthy, low-sodium diet,  exercising more, and limiting alcohol. This information is not intended to replace advice given to you by your health care provider. Make sure you discuss any questions you have with your health care provider. Document Revised: 01/28/2018 Document Reviewed: 01/28/2018 Elsevier Patient Education  2020 [employer]  Preventive Care 82-72 Years Old, Male Preventive care refers to lifestyle choices and visits with your health care provider that can promote health and wellness. This includes:  A yearly physical exam. This is also called an annual well check.  Regular dental and eye exams.  Immunizations.  Screening for certain conditions.  Healthy lifestyle choices, such as eating a healthy diet, getting regular exercise, not using drugs or products that contain nicotine and tobacco, and limiting alcohol use. What can I expect for my preventive care visit? Physical exam Your health care provider will check:  Height and weight. These may be used to calculate body mass index (BMI), which is a measurement that tells if you are at a healthy weight.  Heart rate and blood pressure.  Your skin for abnormal spots. Counseling Your health care provider may ask you questions about:  Alcohol, tobacco, and drug use.  Emotional well-being.  Home and relationship well-being.  Sexual activity.  Eating habits.  Work and work Statistician. What immunizations do I need?  Influenza (flu) vaccine  This is recommended every year. Tetanus, diphtheria, and pertussis (Tdap) vaccine  You may need a Td booster every 10 years. Varicella (chickenpox) vaccine  You may need this vaccine if you have not already been vaccinated. Zoster (shingles) vaccine  You may need this after age 38. Measles, mumps, and rubella (MMR) vaccine  You may need at least one dose of MMR if you were born in 1957 or later. You may also need a second dose. Pneumococcal conjugate (PCV13) vaccine  You may need this if  you have certain conditions and were not previously vaccinated. Pneumococcal polysaccharide (PPSV23) vaccine  You may need one or two doses if you smoke cigarettes or if you have certain conditions. Meningococcal conjugate (MenACWY) vaccine  You may need this if you have certain conditions. Hepatitis A vaccine  You may need this if you have certain conditions or if you travel or work in places where you may be exposed to hepatitis A. Hepatitis B vaccine  You may need this if you have certain conditions or if you travel or work in places where you may be exposed to hepatitis B. Haemophilus influenzae type b (Hib) vaccine  You may need this if you have certain risk factors. Human papillomavirus (HPV) vaccine  If recommended by your health care provider, you may  need three doses over 6 months. You may receive vaccines as individual doses or as more than one vaccine together in one shot (combination vaccines). Talk with your health care provider about the risks and benefits of combination vaccines. What tests do I need? Blood tests  Lipid and cholesterol levels. These may be checked every 5 years, or more frequently if you are over 60 years old.  Hepatitis C test.  Hepatitis B test. Screening  Lung cancer screening. You may have this screening every year starting at age 28 if you have a 30-pack-year history of smoking and currently smoke or have quit within the past 15 years.  Prostate cancer screening. Recommendations will vary depending on your family history and other risks.  Colorectal cancer screening. All adults should have this screening starting at age 89 and continuing until age 69. Your health care provider may recommend screening at age 29 if you are at increased risk. You will have tests every 1-10 years, depending on your results and the type of screening test.  Diabetes screening. This is done by checking your blood sugar (glucose) after you have not eaten for a while  (fasting). You may have this done every 1-3 years.  Sexually transmitted disease (STD) testing. Follow these instructions at home: Eating and drinking  Eat a diet that includes fresh fruits and vegetables, whole grains, lean protein, and low-fat dairy products.  Take vitamin and mineral supplements as recommended by your health care provider.  Do not drink alcohol if your health care provider tells you not to drink.  If you drink alcohol: ? Limit how much you have to 0-2 drinks a day. ? Be aware of how much alcohol is in your drink. In the [place] one drink equals one 12 oz bottle of beer (355 mL), one 5 oz glass of wine (148 mL), or one 1 oz glass of hard liquor (44 mL). Lifestyle  Take daily care of your teeth and gums.  Stay active. Exercise for at least 30 minutes on 5 or more days each week.  Do not use any products that contain nicotine or tobacco, such as cigarettes, e-cigarettes, and chewing tobacco. If you need help quitting, ask your health care provider.  If you are sexually active, practice safe sex. Use a condom or other form of protection to prevent STIs (sexually transmitted infections).  Talk with your health care provider about taking a low-dose aspirin every day starting at age 34. What's next?  Go to your health care provider once a year for a well check visit.  Ask your health care provider how often you should have your eyes and teeth checked.  Stay up to date on all vaccines. This information is not intended to replace advice given to you by your health care provider. Make sure you discuss any questions you have with your health care provider. Document Revised: 05/14/2018 Document Reviewed: 05/14/2018 Elsevier Patient Education  2020 [employer] Maintenance, Male Adopting a healthy lifestyle and getting preventive care are important in promoting health and wellness. Ask your health care provider about:  The right schedule for you to have regular  tests and exams.  Things you can do on your own to prevent diseases and keep yourself healthy. What should I know about diet, weight, and exercise? Eat a healthy diet   Eat a diet that includes plenty of vegetables, fruits, low-fat dairy products, and lean protein.  Do not eat a lot of foods that are high in  solid fats, added sugars, or sodium. Maintain a healthy weight Body mass index (BMI) is a measurement that can be used to identify possible weight problems. It estimates body fat based on height and weight. Your health care provider can help determine your BMI and help you achieve or maintain a healthy weight. Get regular exercise Get regular exercise. This is one of the most important things you can do for your health. Most adults should:  Exercise for at least 150 minutes each week. The exercise should increase your heart rate and make you sweat (moderate-intensity exercise).  Do strengthening exercises at least twice a week. This is in addition to the moderate-intensity exercise.  Spend less time sitting. Even light physical activity can be beneficial. Watch cholesterol and blood lipids Have your blood tested for lipids and cholesterol at 42 years of age, then have this test every 5 years. You may need to have your cholesterol levels checked more often if:  Your lipid or cholesterol levels are high.  You are older than 42 years of age.  You are at high risk for heart disease. What should I know about cancer screening? Many types of cancers can be detected early and may often be prevented. Depending on your health history and family history, you may need to have cancer screening at various ages. This may include screening for:  Colorectal cancer.  Prostate cancer.  Skin cancer.  Lung cancer. What should I know about heart disease, diabetes, and high blood pressure? Blood pressure and heart disease  High blood pressure causes heart disease and increases the risk of  stroke. This is more likely to develop in people who have high blood pressure readings, are of African descent, or are overweight.  Talk with your health care provider about your target blood pressure readings.  Have your blood pressure checked: ? Every 3-5 years if you are 55-59 years of age. ? Every year if you are 28 years old or older.  If you are between the ages of 17 and 7 and are a current or former smoker, ask your health care provider if you should have a one-time screening for abdominal aortic aneurysm (AAA). Diabetes Have regular diabetes screenings. This checks your fasting blood sugar level. Have the screening done:  Once every three years after age 59 if you are at a normal weight and have a low risk for diabetes.  More often and at a younger age if you are overweight or have a high risk for diabetes. What should I know about preventing infection? Hepatitis B If you have a higher risk for hepatitis B, you should be screened for this virus. Talk with your health care provider to find out if you are at risk for hepatitis B infection. Hepatitis C Blood testing is recommended for:  Everyone born from [birth year] through 1963.  Anyone with known risk factors for hepatitis C. Sexually transmitted infections (STIs)  You should be screened each year for STIs, including gonorrhea and chlamydia, if: ? You are sexually active and are younger than 42 years of age. ? You are older than 42 years of age and your health care provider tells you that you are at risk for this type of infection. ? Your sexual activity has changed since you were last screened, and you are at increased risk for chlamydia or gonorrhea. Ask your health care provider if you are at risk.  Ask your health care provider about whether you are at high risk for HIV. Your  health care provider may recommend a prescription medicine to help prevent HIV infection. If you choose to take medicine to prevent HIV, you should first  get tested for HIV. You should then be tested every 3 months for as long as you are taking the medicine. Follow these instructions at home: Lifestyle  Do not use any products that contain nicotine or tobacco, such as cigarettes, e-cigarettes, and chewing tobacco. If you need help quitting, ask your health care provider.  Do not use street drugs.  Do not share needles.  Ask your health care provider for help if you need support or information about quitting drugs. Alcohol use  Do not drink alcohol if your health care provider tells you not to drink.  If you drink alcohol: ? Limit how much you have to 0-2 drinks a day. ? Be aware of how much alcohol is in your drink. In the [place] one drink equals one 12 oz bottle of beer (355 mL), one 5 oz glass of wine (148 mL), or one 1 oz glass of hard liquor (44 mL). General instructions  Schedule regular health, dental, and eye exams.  Stay current with your vaccines.  Tell your health care provider if: ? You often feel depressed. ? You have ever been abused or do not feel safe at home. Summary  Adopting a healthy lifestyle and getting preventive care are important in promoting health and wellness.  Follow your health care provider's instructions about healthy diet, exercising, and getting tested or screened for diseases.  Follow your health care provider's instructions on monitoring your cholesterol and blood pressure. This information is not intended to replace advice given to you by your health care provider. Make sure you discuss any questions you have with your health care provider. Document Revised: 05/13/2018 Document Reviewed: 05/13/2018 Elsevier Patient Education  [employer] [employer].  Managing Your Hypertension Hypertension is commonly called high blood pressure. This is when the force of your blood pressing against the walls of your arteries is too strong. Arteries are blood vessels that carry blood from your heart throughout your  body. Hypertension forces the heart to work harder to pump blood, and may cause the arteries to become narrow or stiff. Having untreated or uncontrolled hypertension can cause heart attack, stroke, kidney disease, and other problems. What are blood pressure readings? A blood pressure reading consists of a higher number over a lower number. Ideally, your blood pressure should be below 120/80. The first ("top") number is called the systolic pressure. It is a measure of the pressure in your arteries as your heart beats. The second ("bottom") number is called the diastolic pressure. It is a measure of the pressure in your arteries as the heart relaxes. What does my blood pressure reading mean? Blood pressure is classified into four stages. Based on your blood pressure reading, your health care provider may use the following stages to determine what type of treatment you need, if any. Systolic pressure and diastolic pressure are measured in a unit called mm Hg. Normal  Systolic pressure: below 341.  Diastolic pressure: below 80. Elevated  Systolic pressure: 962-229.  Diastolic pressure: below 80. Hypertension stage 1  Systolic pressure: 798-921.  Diastolic pressure: 19-41. Hypertension stage 2  Systolic pressure: 740 or above.  Diastolic pressure: 90 or above. What health risks are associated with hypertension? Managing your hypertension is an important responsibility. Uncontrolled hypertension can lead to:  A heart attack.  A stroke.  A weakened blood vessel (aneurysm).  Heart  failure.  Kidney damage.  Eye damage.  Metabolic syndrome.  Memory and concentration problems. What changes can I make to manage my hypertension? Hypertension can be managed by making lifestyle changes and possibly by taking medicines. Your health care provider will help you make a plan to bring your blood pressure within a normal range. Eating and drinking   Eat a diet that is high in fiber and  potassium, and low in salt (sodium), added sugar, and fat. An example eating plan is called the DASH (Dietary Approaches to Stop Hypertension) diet. To eat this way: ? Eat plenty of fresh fruits and vegetables. Try to fill half of your plate at each meal with fruits and vegetables. ? Eat whole grains, such as whole wheat pasta, brown rice, or whole grain bread. Fill about one quarter of your plate with whole grains. ? Eat low-fat diary products. ? Avoid fatty cuts of meat, processed or cured meats, and poultry with skin. Fill about one quarter of your plate with lean proteins such as fish, chicken without skin, beans, eggs, and tofu. ? Avoid premade and processed foods. These tend to be higher in sodium, added sugar, and fat.  Reduce your daily sodium intake. Most people with hypertension should eat less than 1,500 mg of sodium a day.  Limit alcohol intake to no more than 1 drink a day for nonpregnant women and 2 drinks a day for men. One drink equals 12 oz of beer, 5 oz of wine, or 1 oz of hard liquor. Lifestyle  Work with your health care provider to maintain a healthy body weight, or to lose weight. Ask what an ideal weight is for you.  Get at least 30 minutes of exercise that causes your heart to beat faster (aerobic exercise) most days of the week. Activities may include walking, swimming, or biking.  Include exercise to strengthen your muscles (resistance exercise), such as weight lifting, as part of your weekly exercise routine. Try to do these types of exercises for 30 minutes at least 3 days a week.  Do not use any products that contain nicotine or tobacco, such as cigarettes and e-cigarettes. If you need help quitting, ask your health care provider.  Control any long-term (chronic) conditions you have, such as high cholesterol or diabetes. Monitoring  Monitor your blood pressure at home as told by your health care provider. Your personal target blood pressure may vary depending on  your medical conditions, your age, and other factors.  Have your blood pressure checked regularly, as often as told by your health care provider. Working with your health care provider  Review all the medicines you take with your health care provider because there may be side effects or interactions.  Talk with your health care provider about your diet, exercise habits, and other lifestyle factors that may be contributing to hypertension.  Visit your health care provider regularly. Your health care provider can help you create and adjust your plan for managing hypertension. Will I need medicine to control my blood pressure? Your health care provider may prescribe medicine if lifestyle changes are not enough to get your blood pressure under control, and if:  Your systolic blood pressure is 130 or higher.  Your diastolic blood pressure is 80 or higher. Take medicines only as told by your health care provider. Follow the directions carefully. Blood pressure medicines must be taken as prescribed. The medicine does not work as well when you skip doses. Skipping doses also puts you at risk  for problems. Contact a health care provider if:  You think you are having a reaction to medicines you have taken.  You have repeated (recurrent) headaches.  You feel dizzy.  You have swelling in your ankles.  You have trouble with your vision. Get help right away if:  You develop a severe headache or confusion.  You have unusual weakness or numbness, or you feel faint.  You have severe pain in your chest or abdomen.  You vomit repeatedly.  You have trouble breathing. Summary  Hypertension is when the force of blood pumping through your arteries is too strong. If this condition is not controlled, it may put you at risk for serious complications.  Your personal target blood pressure may vary depending on your medical conditions, your age, and other factors. For most people, a normal blood  pressure is less than 120/80.  Hypertension is managed by lifestyle changes, medicines, or both. Lifestyle changes include weight loss, eating a healthy, low-sodium diet, exercising more, and limiting alcohol. This information is not intended to replace advice given to you by your health care provider. Make sure you discuss any questions you have with your health care provider. Document Revised: 09/11/2018 Document Reviewed: 04/17/2016 Elsevier Patient Education  [place].

## 2019-09-22 ENCOUNTER — Other Ambulatory Visit (INDEPENDENT_AMBULATORY_CARE_PROVIDER_SITE_OTHER): Payer: 59

## 2019-09-22 ENCOUNTER — Other Ambulatory Visit: Payer: Self-pay

## 2019-09-22 DIAGNOSIS — I10 Essential (primary) hypertension: Secondary | ICD-10-CM

## 2019-09-22 DIAGNOSIS — Z0000 Encounter for general adult medical examination without abnormal findings: Secondary | ICD-10-CM | POA: Diagnosis not present

## 2019-09-22 LAB — URINALYSIS, ROUTINE W REFLEX MICROSCOPIC
Bilirubin Urine: NEGATIVE
Hgb urine dipstick: NEGATIVE
Ketones, ur: NEGATIVE
Leukocytes,Ua: NEGATIVE
Nitrite: NEGATIVE
RBC / HPF: NONE SEEN (ref 0–?)
Specific Gravity, Urine: 1.02 (ref 1.000–1.030)
Urine Glucose: NEGATIVE
Urobilinogen, UA: 0.2 (ref 0.0–1.0)
pH: 6.5 (ref 5.0–8.0)

## 2019-09-22 LAB — COMPREHENSIVE METABOLIC PANEL
ALT: 16 U/L (ref 0–53)
AST: 17 U/L (ref 0–37)
Albumin: 4.6 g/dL (ref 3.5–5.2)
Alkaline Phosphatase: 57 U/L (ref 39–117)
BUN: 15 mg/dL (ref 6–23)
CO2: 25 mEq/L (ref 19–32)
Calcium: 9.3 mg/dL (ref 8.4–10.5)
Chloride: 104 mEq/L (ref 96–112)
Creatinine, Ser: 1.07 mg/dL (ref 0.40–1.50)
GFR: 75.99 mL/min (ref >60.00)
Glucose, Bld: 99 mg/dL (ref 70–99)
Potassium: 3.8 mEq/L (ref 3.5–5.1)
Sodium: 136 mEq/L (ref 135–145)
Total Bilirubin: 0.6 mg/dL (ref 0.2–1.2)
Total Protein: 7.6 g/dL (ref 6.0–8.3)

## 2019-09-22 LAB — LIPID PANEL
Cholesterol: 193 mg/dL (ref 0–200)
HDL: 28.6 mg/dL — ABNORMAL LOW (ref >39.00)
LDL Cholesterol: 129 mg/dL — ABNORMAL HIGH (ref 0–99)
NonHDL: 164.21
Total CHOL/HDL Ratio: 7
Triglycerides: 177 mg/dL — ABNORMAL HIGH (ref 0.0–149.0)
VLDL: 35.4 mg/dL (ref 0.0–40.0)

## 2019-09-23 LAB — CBC
HCT: 41.7 % (ref 39.0–52.0)
Hemoglobin: 14.8 g/dL (ref 13.0–17.0)
MCHC: 35.4 g/dL (ref 30.0–36.0)
MCV: 88.7 fl (ref 78.0–100.0)
Platelets: 169 10*3/uL (ref 150.0–400.0)
RBC: 4.7 Mil/uL (ref 4.22–5.81)
RDW: 12.4 % (ref 11.5–15.5)
WBC: 4.5 10*3/uL (ref 4.0–10.5)

## 2019-10-04 ENCOUNTER — Encounter: Payer: Self-pay | Admitting: Oncology

## 2019-10-04 ENCOUNTER — Telehealth: Payer: Self-pay | Admitting: Oncology

## 2019-10-04 NOTE — Telephone Encounter (Signed)
Rescheduled appt per 5/3 sch message - r/s appt on 5/10 unable to reach pt . Left message with new appt date and time

## 2019-10-11 ENCOUNTER — Inpatient Hospital Stay: Payer: 59 | Attending: Oncology

## 2019-10-11 ENCOUNTER — Telehealth: Payer: Self-pay | Admitting: Oncology

## 2019-10-11 ENCOUNTER — Inpatient Hospital Stay (HOSPITAL_BASED_OUTPATIENT_CLINIC_OR_DEPARTMENT_OTHER): Payer: 59 | Admitting: Oncology

## 2019-10-11 ENCOUNTER — Other Ambulatory Visit: Payer: Self-pay

## 2019-10-11 ENCOUNTER — Other Ambulatory Visit: Payer: 59

## 2019-10-11 VITALS — BP 142/96 | HR 91 | Temp 97.6°F | Resp 18 | Ht 70.0 in | Wt 206.5 lb

## 2019-10-11 DIAGNOSIS — Z85038 Personal history of other malignant neoplasm of large intestine: Secondary | ICD-10-CM | POA: Insufficient documentation

## 2019-10-11 DIAGNOSIS — Z8719 Personal history of other diseases of the digestive system: Secondary | ICD-10-CM | POA: Diagnosis not present

## 2019-10-11 DIAGNOSIS — C187 Malignant neoplasm of sigmoid colon: Secondary | ICD-10-CM

## 2019-10-11 DIAGNOSIS — I10 Essential (primary) hypertension: Secondary | ICD-10-CM | POA: Diagnosis not present

## 2019-10-11 DIAGNOSIS — Z862 Personal history of diseases of the blood and blood-forming organs and certain disorders involving the immune mechanism: Secondary | ICD-10-CM | POA: Diagnosis not present

## 2019-10-11 DIAGNOSIS — Z9049 Acquired absence of other specified parts of digestive tract: Secondary | ICD-10-CM | POA: Diagnosis not present

## 2019-10-11 LAB — CEA (IN HOUSE-CHCC): CEA (CHCC-In House): 3.42 ng/mL (ref 0.00–5.00)

## 2019-10-11 NOTE — Telephone Encounter (Signed)
Scheduled appt per 5/10 los - pt aware of appts - no print out needed per pt .

## 2019-10-11 NOTE — Progress Notes (Signed)
  [facility] OFFICE PROGRESS NOTE   Diagnosis: Colon cancer  INTERVAL HISTORY:   Charles Rowland returns as scheduled.  He feels well.  Good appetite and energy level.  He exercises by walking 4 to 5 miles daily.  No difficulty with bowel or bladder function.  No bleeding.  He had a "upset stomach "a few days ago.  This has resolved. He has received the COVID-19 vaccine.  He is being followed by Dr. Ethelene Hal for management of hypertension. Objective:  Vital signs in last 24 hours:  Blood pressure (!) 133/104, pulse 91, temperature 97.6 F (36.4 C), temperature source Temporal, resp. rate 18, height '5\' 10"'$  (1.778 m), weight 206 lb 8 oz (93.7 kg), SpO2 100 %.   Lymphatics: No cervical, supraclavicular, axillary, or inguinal nodes Resp: Lungs clear bilaterally Cardio: Regular rate and rhythm GI: Nontender, no mass, no hepatosplenomegaly Vascular: No leg edema   Lab Results:  Lab Results  Component Value Date   WBC 4.5 09/22/2019   HGB 14.8 09/22/2019   HCT 41.7 09/22/2019   MCV 88.7 09/22/2019   PLT 169.0 09/22/2019   NEUTROABS 2,116 12/29/2018    CMP  Lab Results  Component Value Date   NA 136 09/22/2019   K 3.8 09/22/2019   CL 104 09/22/2019   CO2 25 09/22/2019   GLUCOSE 99 09/22/2019   BUN 15 09/22/2019   CREATININE 1.07 09/22/2019   CALCIUM 9.3 09/22/2019   PROT 7.6 09/22/2019   ALBUMIN 4.6 09/22/2019   AST 17 09/22/2019   ALT 16 09/22/2019   ALKPHOS 57 09/22/2019   BILITOT 0.6 09/22/2019   GFRNONAA >60 04/09/2018   GFRAA >60 04/09/2018    Lab Results  Component Value Date   CEA1 3.34 04/09/2019    No results found for: INR  Imaging:  No results found.  Medications: I have reviewed the patient's current medications.   Assessment/Plan: 1.  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   History of iron deficiency anemia secondary to GI blood loss and tumor bleeding  3.  Left leg pain December 2019-negative Doppler, resolved 4.  Hypertension    Disposition: Charles Rowland is in clinical remission from colon cancer.  We will follow up on the CEA from today.  He will return for an office visit and CEA in 6 months.  Betsy Coder, MD  10/11/2019  8:53 AM

## 2019-10-12 ENCOUNTER — Encounter: Payer: Self-pay | Admitting: Oncology

## 2019-12-20 ENCOUNTER — Other Ambulatory Visit: Payer: Self-pay

## 2019-12-21 ENCOUNTER — Ambulatory Visit (INDEPENDENT_AMBULATORY_CARE_PROVIDER_SITE_OTHER): Payer: 59 | Admitting: Family Medicine

## 2019-12-21 ENCOUNTER — Encounter: Payer: Self-pay | Admitting: Family Medicine

## 2019-12-21 VITALS — BP 150/94 | HR 104 | Temp 98.0°F | Ht 70.0 in | Wt 206.4 lb

## 2019-12-21 DIAGNOSIS — I10 Essential (primary) hypertension: Secondary | ICD-10-CM | POA: Diagnosis not present

## 2019-12-21 DIAGNOSIS — F411 Generalized anxiety disorder: Secondary | ICD-10-CM | POA: Diagnosis not present

## 2019-12-21 LAB — BASIC METABOLIC PANEL
BUN: 15 mg/dL (ref 6–23)
CO2: 26 mEq/L (ref 19–32)
Calcium: 9.8 mg/dL (ref 8.4–10.5)
Chloride: 103 mEq/L (ref 96–112)
Creatinine, Ser: 1.18 mg/dL (ref 0.40–1.50)
GFR: 67.8 mL/min (ref >60.00)
Glucose, Bld: 100 mg/dL — ABNORMAL HIGH (ref 70–99)
Potassium: 4 mEq/L (ref 3.5–5.1)
Sodium: 136 mEq/L (ref 135–145)

## 2019-12-21 MED ORDER — OLMESARTAN MEDOXOMIL 40 MG PO TABS: 40.0000 mg | ORAL_TABLET | Freq: Every day | ORAL | 1 refills | Status: DC

## 2019-12-21 MED ORDER — CHLORTHALIDONE 25 MG PO TABS: 25.0000 mg | ORAL_TABLET | Freq: Every day | ORAL | 1 refills | Status: DC

## 2019-12-21 NOTE — Patient Instructions (Signed)
Managing Your Hypertension Hypertension is commonly called high blood pressure. This is when the force of your blood pressing against the walls of your arteries is too strong. Arteries are blood vessels that carry blood from your heart throughout your body. Hypertension forces the heart to work harder to pump blood, and may cause the arteries to become narrow or stiff. Having untreated or uncontrolled hypertension can cause heart attack, stroke, kidney disease, and other problems. What are blood pressure readings? A blood pressure reading consists of a higher number over a lower number. Ideally, your blood pressure should be below 120/80. The first ("top") number is called the systolic pressure. It is a measure of the pressure in your arteries as your heart beats. The second ("bottom") number is called the diastolic pressure. It is a measure of the pressure in your arteries as the heart relaxes. What does my blood pressure reading mean? Blood pressure is classified into four stages. Based on your blood pressure reading, your health care provider may use the following stages to determine what type of treatment you need, if any. Systolic pressure and diastolic pressure are measured in a unit called mm Hg. Normal  Systolic pressure: below 120.  Diastolic pressure: below 80. Elevated  Systolic pressure: 120-129.  Diastolic pressure: below 80. Hypertension stage 1  Systolic pressure: 130-139.  Diastolic pressure: 80-89. Hypertension stage 2  Systolic pressure: 140 or above.  Diastolic pressure: 90 or above. What health risks are associated with hypertension? Managing your hypertension is an important responsibility. Uncontrolled hypertension can lead to:  A heart attack.  A stroke.  A weakened blood vessel (aneurysm).  Heart failure.  Kidney damage.  Eye damage.  Metabolic syndrome.  Memory and concentration problems. What changes can I make to manage my  hypertension? Hypertension can be managed by making lifestyle changes and possibly by taking medicines. Your health care provider will help you make a plan to bring your blood pressure within a normal range. Eating and drinking   Eat a diet that is high in fiber and potassium, and low in salt (sodium), added sugar, and fat. An example eating plan is called the DASH (Dietary Approaches to Stop Hypertension) diet. To eat this way: ? Eat plenty of fresh fruits and vegetables. Try to fill half of your plate at each meal with fruits and vegetables. ? Eat whole grains, such as whole wheat pasta, brown rice, or whole grain bread. Fill about one quarter of your plate with whole grains. ? Eat low-fat diary products. ? Avoid fatty cuts of meat, processed or cured meats, and poultry with skin. Fill about one quarter of your plate with lean proteins such as fish, chicken without skin, beans, eggs, and tofu. ? Avoid premade and processed foods. These tend to be higher in sodium, added sugar, and fat.  Reduce your daily sodium intake. Most people with hypertension should eat less than 1,500 mg of sodium a day.  Limit alcohol intake to no more than 1 drink a day for nonpregnant women and 2 drinks a day for men. One drink equals 12 oz of beer, 5 oz of wine, or 1 oz of hard liquor. Lifestyle  Work with your health care provider to maintain a healthy body weight, or to lose weight. Ask what an ideal weight is for you.  Get at least 30 minutes of exercise that causes your heart to beat faster (aerobic exercise) most days of the week. Activities may include walking, swimming, or biking.  Include exercise   to strengthen your muscles (resistance exercise), such as weight lifting, as part of your weekly exercise routine. Try to do these types of exercises for 30 minutes at least 3 days a week.  Do not use any products that contain nicotine or tobacco, such as cigarettes and e-cigarettes. If you need help quitting,  ask your health care provider.  Control any long-term (chronic) conditions you have, such as high cholesterol or diabetes. Monitoring  Monitor your blood pressure at home as told by your health care provider. Your personal target blood pressure may vary depending on your medical conditions, your age, and other factors.  Have your blood pressure checked regularly, as often as told by your health care provider. Working with your health care provider  Review all the medicines you take with your health care provider because there may be side effects or interactions.  Talk with your health care provider about your diet, exercise habits, and other lifestyle factors that may be contributing to hypertension.  Visit your health care provider regularly. Your health care provider can help you create and adjust your plan for managing hypertension. Will I need medicine to control my blood pressure? Your health care provider may prescribe medicine if lifestyle changes are not enough to get your blood pressure under control, and if:  Your systolic blood pressure is 130 or higher.  Your diastolic blood pressure is 80 or higher. Take medicines only as told by your health care provider. Follow the directions carefully. Blood pressure medicines must be taken as prescribed. The medicine does not work as well when you skip doses. Skipping doses also puts you at risk for problems. Contact a health care provider if:  You think you are having a reaction to medicines you have taken.  You have repeated (recurrent) headaches.  You feel dizzy.  You have swelling in your ankles.  You have trouble with your vision. Get help right away if:  You develop a severe headache or confusion.  You have unusual weakness or numbness, or you feel faint.  You have severe pain in your chest or abdomen.  You vomit repeatedly.  You have trouble breathing. Summary  Hypertension is when the force of blood pumping  through your arteries is too strong. If this condition is not controlled, it may put you at risk for serious complications.  Your personal target blood pressure may vary depending on your medical conditions, your age, and other factors. For most people, a normal blood pressure is less than 120/80.  Hypertension is managed by lifestyle changes, medicines, or both. Lifestyle changes include weight loss, eating a healthy, low-sodium diet, exercising more, and limiting alcohol. This information is not intended to replace advice given to you by your health care provider. Make sure you discuss any questions you have with your health care provider. Document Revised: 09/11/2018 Document Reviewed: 04/17/2016 Elsevier Patient Education  [place].  Mindfulness-Based Stress Reduction Mindfulness-based stress reduction (MBSR) is a program that helps people learn to practice mindfulness. Mindfulness is the practice of intentionally paying attention to the present moment. It can be learned and practiced through techniques such as education, breathing exercises, meditation, and yoga. MBSR includes several mindfulness techniques in one program. MBSR works best when you understand the treatment, are willing to try new things, and can commit to spending time practicing what you learn. MBSR training may include learning about:  How your emotions, thoughts, and reactions affect your body.  New ways to respond to things that cause  negative thoughts to start (triggers).  How to notice your thoughts and let go of them.  Practicing awareness of everyday things that you normally do without thinking.  The techniques and goals of different types of meditation. What are the benefits of MBSR? MBSR can have many benefits, which include helping you to:  Develop self-awareness. This refers to knowing and understanding yourself.  Learn skills and attitudes that help you to participate in your own health  care.  Learn new ways to care for yourself.  Be more accepting about how things are, and let things go.  Be less judgmental and approach things with an open mind.  Be patient with yourself and trust yourself more. MBSR has also been shown to:  Reduce negative emotions, such as depression and anxiety.  Improve memory and focus.  Change how you sense and approach pain.  Boost your body's ability to fight infections.  Help you connect better with other people.  Improve your sense of well-being. Follow these instructions at home:   Find a local in-person or online MBSR program.  Set aside some time regularly for mindfulness practice.  Find a mindfulness practice that works best for you. This may include one or more of the following: ? Meditation. Meditation involves focusing your mind on a certain thought or activity. ? Breathing awareness exercises. These help you to stay present by focusing on your breath. ? Body scan. For this practice, you lie down and pay attention to each part of your body from head to toe. You can identify tension and soreness and intentionally relax parts of your body. ? Yoga. Yoga involves stretching and breathing, and it can improve your ability to move and be flexible. It can also provide an experience of testing your body's limits, which can help you release stress. ? Mindful eating. This way of eating involves focusing on the taste, texture, color, and smell of each bite of food. Because this slows down eating and helps you feel full sooner, it can be an important part of a weight-loss plan.  Find a podcast or recording that provides guidance for breathing awareness, body scan, or meditation exercises. You can listen to these any time when you have a free moment to rest without distractions.  Follow your treatment plan as told by your health care provider. This may include taking regular medicines and making changes to your diet or lifestyle as  recommended. How to practice mindfulness To do a basic awareness exercise:  Find a comfortable place to sit.  Pay attention to the present moment. Observe your thoughts, feelings, and surroundings just as they are.  Avoid placing judgment on yourself, your feelings, or your surroundings. Make note of any judgment that comes up, and let it go.  Your mind may wander, and that is okay. Make note of when your thoughts drift, and return your attention to the present moment. To do basic mindfulness meditation:  Find a comfortable place to sit. This may include a stable chair or a firm floor cushion. ? Sit upright with your back straight. Let your arms fall next to your side with your hands resting on your legs. ? If sitting in a chair, rest your feet flat on the floor. ? If sitting on a cushion, cross your legs in front of you.  Keep your head in a neutral position with your chin dropped slightly. Relax your jaw and rest the tip of your tongue on the roof of your mouth. Drop your gaze  to the floor. You can close your eyes if you like.  Breathe normally and pay attention to your breath. Feel the air moving in and out of your nose. Feel your belly expanding and relaxing with each breath.  Your mind may wander, and that is okay. Make note of when your thoughts drift, and return your attention to your breath.  Avoid placing judgment on yourself, your feelings, or your surroundings. Make note of any judgment or feelings that come up, let them go, and bring your attention back to your breath.  When you are ready, lift your gaze or open your eyes. Pay attention to how your body feels after the meditation. Where to find more information You can find more information about MBSR from:  Your health care provider.  Community-based meditation centers or programs.  Programs offered near you. Summary  Mindfulness-based stress reduction (MBSR) is a program that teaches you how to intentionally pay  attention to the present moment. It is used with other treatments to help you cope better with daily stress, emotions, and pain.  MBSR focuses on developing self-awareness, which allows you to respond to life stress without judgment or negative emotions.  MBSR programs may involve learning different mindfulness practices, such as breathing exercises, meditation, yoga, body scan, or mindful eating. Find a mindfulness practice that works best for you, and set aside time for it on a regular basis. This information is not intended to replace advice given to you by your health care provider. Make sure you discuss any questions you have with your health care provider. Document Revised: 05/02/2017 Document Reviewed: 09/26/2016 Elsevier Patient Education  [place].

## 2019-12-21 NOTE — Progress Notes (Signed)
Established Patient Office Visit  Subjective:  Patient ID: Charles Rowland, male    DOB: Mar 14, 1976  Age: 44 y.o. MRN: [id number]  CC:  Chief Complaint  Patient presents with  . Follow-up    3 month follow up, no concerns.     HPI Charles Rowland presents for follow-up of hypertension.  Tolerating olmesartan well.  Tells of ongoing stress with his job situation.  Things are well at home with his wife and 61-year-old.  He is exercising for at least 30 minutes most days of the week.  He does not smoke or use illicit drugs rarely drinks alcohol.  Things are looking good with his colorectal cancer.  CEA antigen checked in May was in the normal range.  Bowel habits are back to normal.  No blood in the stool.  Past Medical History:  Diagnosis Date  . Cancer ([place])    Colon Dx 2019  . Family history of breast cancer     Past Surgical History:  Procedure Laterality Date  . BIOPSY  04/06/2018   Procedure: BIOPSY;  Surgeon: Ronnette Juniper, MD;  Location: [facility];  Service: Gastroenterology;;  . COLONOSCOPY WITH PROPOFOL N/A 04/06/2018   Procedure: COLONOSCOPY WITH PROPOFOL;  Surgeon: Ronnette Juniper, MD;  Location: [facility];  Service: Gastroenterology;  Laterality: N/A;  . LAPAROSCOPIC SMALL BOWEL RESECTION N/A 04/08/2018   Procedure: LAPAROSCOPIC ASSISTED LOW ANTERIOR RESECTION;  Surgeon: Jovita Kussmaul, MD;  Location: [facility];  Service: General;  Laterality: N/A;  . POLYPECTOMY  04/06/2018   Procedure: POLYPECTOMY;  Surgeon: Ronnette Juniper, MD;  Location: [facility];  Service: Gastroenterology;;  . Lia Foyer INJECTION  04/06/2018   Procedure: SUBMUCOSAL INJECTION;  Surgeon: Ronnette Juniper, MD;  [facility]: [facility];  Service: Gastroenterology;;    Family History  Problem Relation Age of Onset  . Lung cancer Other   . Breast cancer Maternal Grandmother        d. 58  . Cancer Maternal Grandfather        lung, smoker  . Hypertension Mother   . Hypertension Father   . Kidney disease  Maternal Aunt   . CAD Neg Hx   . Stroke Neg Hx     Social History   Socioeconomic History  . Marital status: Married    Spouse name: Not on file  . Number of children: Not on file  . Years of education: Not on file  . Highest education level: Not on file  Occupational History  . Not on file  Tobacco Use  . Smoking status: Never Smoker  . Smokeless tobacco: Never Used  Vaping Use  . Vaping Use: Never used  Substance and Sexual Activity  . Alcohol use: Yes    Comment: occasional  . Drug use: Never  . Sexual activity: Yes  Other Topics Concern  . Not on file  Social History Narrative  . Not on file   Social Determinants of Health   Financial Resource Strain:   . Difficulty of Paying Living Expenses:   Food Insecurity:   . Worried About Charity fundraiser in the Last Year:   . Arboriculturist in the Last Year:   Transportation Needs:   . Film/video editor (Medical):   Marland Kitchen Lack of Transportation (Non-Medical):   Physical Activity:   . Days of Exercise per Week:   . Minutes of Exercise per Session:   Stress:   . Feeling of Stress :   Social Connections:   . Frequency  of Communication with Friends and Family:   . Frequency of Social Gatherings with Friends and Family:   . Attends Religious Services:   . Active Member of Clubs or Organizations:   . Attends Archivist Meetings:   Marland Kitchen Marital Status:   Intimate Partner Violence:   . Fear of Current or Ex-Partner:   . Emotionally Abused:   Marland Kitchen Physically Abused:   . Sexually Abused:     Outpatient Medications Prior to Visit  Medication Sig Dispense Refill  . Lactobacillus (PROBIOTIC ACIDOPHILUS PO) Take 1 tablet by mouth daily.    . psyllium (REGULOID) 0.52 g capsule Take 0.52 g by mouth daily.    Marland Kitchen VITAMIN D, CHOLECALCIFEROL, PO Take 50 mcg by mouth daily.     Marland Kitchen olmesartan (BENICAR) 40 MG tablet Take 1 tablet (40 mg total) by mouth daily. 100 tablet 1   No facility-administered medications prior to  visit.    No Known Allergies  ROS Review of Systems  Constitutional: Negative.   HENT: Negative.   Respiratory: Negative.   Cardiovascular: Negative.   Gastrointestinal: Negative for abdominal pain, anal bleeding, blood in stool, constipation and diarrhea.  Genitourinary: Negative.   Musculoskeletal: Negative.   Allergic/Immunologic: Negative for immunocompromised state.  Neurological: Negative for tremors, speech difficulty, light-headedness and headaches.  Hematological: Does not bruise/bleed easily.  Psychiatric/Behavioral: The patient is nervous/anxious.       Objective:    Physical Exam Constitutional:      General: He is not in acute distress.    Appearance: Normal appearance. He is not ill-appearing, toxic-appearing or diaphoretic.  HENT:     Head: Normocephalic and atraumatic.     Right Ear: Tympanic membrane, ear canal and external ear normal. There is no impacted cerumen.     Left Ear: Tympanic membrane, ear canal and external ear normal. There is no impacted cerumen.     Mouth/Throat:     Mouth: Mucous membranes are moist.     Pharynx: Oropharynx is clear. No oropharyngeal exudate or posterior oropharyngeal erythema.  Eyes:     General:        Right eye: No discharge.        Left eye: No discharge.     Conjunctiva/sclera: Conjunctivae normal.     Pupils: Pupils are equal, round, and reactive to light.  Cardiovascular:     Rate and Rhythm: Normal rate and regular rhythm.  Pulmonary:     Effort: Pulmonary effort is normal.     Breath sounds: Normal breath sounds.  Abdominal:     General: Bowel sounds are normal.  Skin:    General: Skin is warm and dry.  Neurological:     Mental Status: He is alert and oriented to person, place, and time.  Psychiatric:        Mood and Affect: Mood normal.        Behavior: Behavior normal.     BP (!) 150/94   Pulse (!) 104   Temp 98 F (36.7 C) (Tympanic)   Ht 5\' 10"  (1.778 m)   Wt 206 lb 6.4 oz (93.6 kg)   SpO2  97%   BMI 29.62 kg/m  Wt Readings from Last 3 Encounters:  12/21/19 206 lb 6.4 oz (93.6 kg)  10/11/19 206 lb 8 oz (93.7 kg)  09/21/19 206 lb (93.4 kg)     Health Maintenance Due  Topic Date Due  . Hepatitis C Screening  Never done    There are no preventive care  reminders to display for this patient.  Lab Results  Component Value Date   TSH 3.88 12/29/2018   Lab Results  Component Value Date   WBC 4.5 09/22/2019   HGB 14.8 09/22/2019   HCT 41.7 09/22/2019   MCV 88.7 09/22/2019   PLT 169.0 09/22/2019   Lab Results  Component Value Date   NA 136 09/22/2019   K 3.8 09/22/2019   CO2 25 09/22/2019   GLUCOSE 99 09/22/2019   BUN 15 09/22/2019   CREATININE 1.07 09/22/2019   BILITOT 0.6 09/22/2019   ALKPHOS 57 09/22/2019   AST 17 09/22/2019   ALT 16 09/22/2019   PROT 7.6 09/22/2019   ALBUMIN 4.6 09/22/2019   CALCIUM 9.3 09/22/2019   ANIONGAP 9 04/09/2018   GFR 75.99 09/22/2019   Lab Results  Component Value Date   CHOL 193 09/22/2019   Lab Results  Component Value Date   HDL 28.60 (L) 09/22/2019   Lab Results  Component Value Date   LDLCALC 129 (H) 09/22/2019   Lab Results  Component Value Date   TRIG 177.0 (H) 09/22/2019   Lab Results  Component Value Date   CHOLHDL 7 09/22/2019   Lab Results  Component Value Date   HGBA1C 4.9 04/07/2018      Assessment & Plan:   Problem List Items Addressed This Visit      Cardiovascular and Mediastinum   Essential hypertension - Primary   Relevant Medications   chlorthalidone (HYGROTON) 25 MG tablet   olmesartan (BENICAR) 40 MG tablet   Other Relevant Orders   Basic metabolic panel     Other   GAD (generalized anxiety disorder)      Meds ordered this encounter  Medications  . chlorthalidone (HYGROTON) 25 MG tablet    Sig: Take 1 tablet (25 mg total) by mouth daily.    Dispense:  100 tablet    Refill:  1  . olmesartan (BENICAR) 40 MG tablet    Sig: Take 1 tablet (40 mg total) by mouth daily.     Dispense:  100 tablet    Refill:  1    Follow-up: Return in about 3 months (around 03/22/2020).   Information given on managing hypertension and mindfulness based stress reduction.  Encouraged him to continue his healthy lifestyle including his current exercise routine.  Have added chlorthalidone.  Discussed side effects.  He will let me know if there are any problems. Libby Maw, MD

## 2020-03-22 ENCOUNTER — Other Ambulatory Visit: Payer: Self-pay

## 2020-03-23 ENCOUNTER — Encounter: Payer: Self-pay | Admitting: Family Medicine

## 2020-03-23 ENCOUNTER — Ambulatory Visit (INDEPENDENT_AMBULATORY_CARE_PROVIDER_SITE_OTHER): Payer: 59 | Admitting: Family Medicine

## 2020-03-23 VITALS — BP 132/96 | HR 85 | Temp 97.7°F | Ht 70.0 in | Wt 204.2 lb

## 2020-03-23 DIAGNOSIS — I10 Essential (primary) hypertension: Secondary | ICD-10-CM

## 2020-03-23 MED ORDER — OLMESARTAN MEDOXOMIL 40 MG PO TABS: 40.0000 mg | ORAL_TABLET | Freq: Every day | ORAL | 1 refills | Status: DC

## 2020-03-23 MED ORDER — HYDROCHLOROTHIAZIDE 25 MG PO TABS: 25.0000 mg | ORAL_TABLET | Freq: Every day | ORAL | 1 refills | Status: DC

## 2020-03-23 NOTE — Addendum Note (Signed)
Addended by: Lynnea Ferrier on: 03/23/2020 11:42 AM   Modules accepted: Orders

## 2020-03-23 NOTE — Patient Instructions (Signed)
   Managing Your Hypertension Hypertension is commonly called high blood pressure. This is when the force of your blood pressing against the walls of your arteries is too strong. Arteries are blood vessels that carry blood from your heart throughout your body. Hypertension forces the heart to work harder to pump blood, and may cause the arteries to become narrow or stiff. Having untreated or uncontrolled hypertension can cause heart attack, stroke, kidney disease, and other problems. What are blood pressure readings? A blood pressure reading consists of a higher number over a lower number. Ideally, your blood pressure should be below 120/80. The first ("top") number is called the systolic pressure. It is a measure of the pressure in your arteries as your heart beats. The second ("bottom") number is called the diastolic pressure. It is a measure of the pressure in your arteries as the heart relaxes. What does my blood pressure reading mean? Blood pressure is classified into four stages. Based on your blood pressure reading, your health care provider may use the following stages to determine what type of treatment you need, if any. Systolic pressure and diastolic pressure are measured in a unit called mm Hg. Normal  Systolic pressure: below 120.  Diastolic pressure: below 80. Elevated  Systolic pressure: 120-129.  Diastolic pressure: below 80. Hypertension stage 1  Systolic pressure: 130-139.  Diastolic pressure: 80-89. Hypertension stage 2  Systolic pressure: 140 or above.  Diastolic pressure: 90 or above. What health risks are associated with hypertension? Managing your hypertension is an important responsibility. Uncontrolled hypertension can lead to:  A heart attack.  A stroke.  A weakened blood vessel (aneurysm).  Heart failure.  Kidney damage.  Eye damage.  Metabolic syndrome.  Memory and concentration problems. What changes can I make to manage my  hypertension? Hypertension can be managed by making lifestyle changes and possibly by taking medicines. Your health care provider will help you make a plan to bring your blood pressure within a normal range. Eating and drinking   Eat a diet that is high in fiber and potassium, and low in salt (sodium), added sugar, and fat. An example eating plan is called the DASH (Dietary Approaches to Stop Hypertension) diet. To eat this way: ? Eat plenty of fresh fruits and vegetables. Try to fill half of your plate at each meal with fruits and vegetables. ? Eat whole grains, such as whole wheat pasta, brown rice, or whole grain bread. Fill about one quarter of your plate with whole grains. ? Eat low-fat diary products. ? Avoid fatty cuts of meat, processed or cured meats, and poultry with skin. Fill about one quarter of your plate with lean proteins such as fish, chicken without skin, beans, eggs, and tofu. ? Avoid premade and processed foods. These tend to be higher in sodium, added sugar, and fat.  Reduce your daily sodium intake. Most people with hypertension should eat less than 1,500 mg of sodium a day.  Limit alcohol intake to no more than 1 drink a day for nonpregnant women and 2 drinks a day for men. One drink equals 12 oz of beer, 5 oz of wine, or 1 oz of hard liquor. Lifestyle  Work with your health care provider to maintain a healthy body weight, or to lose weight. Ask what an ideal weight is for you.  Get at least 30 minutes of exercise that causes your heart to beat faster (aerobic exercise) most days of the week. Activities may include walking, swimming, or biking.    Include exercise to strengthen your muscles (resistance exercise), such as weight lifting, as part of your weekly exercise routine. Try to do these types of exercises for 30 minutes at least 3 days a week.  Do not use any products that contain nicotine or tobacco, such as cigarettes and e-cigarettes. If you need help quitting,  ask your health care provider.  Control any long-term (chronic) conditions you have, such as high cholesterol or diabetes. Monitoring  Monitor your blood pressure at home as told by your health care provider. Your personal target blood pressure may vary depending on your medical conditions, your age, and other factors.  Have your blood pressure checked regularly, as often as told by your health care provider. Working with your health care provider  Review all the medicines you take with your health care provider because there may be side effects or interactions.  Talk with your health care provider about your diet, exercise habits, and other lifestyle factors that may be contributing to hypertension.  Visit your health care provider regularly. Your health care provider can help you create and adjust your plan for managing hypertension. Will I need medicine to control my blood pressure? Your health care provider may prescribe medicine if lifestyle changes are not enough to get your blood pressure under control, and if:  Your systolic blood pressure is 130 or higher.  Your diastolic blood pressure is 80 or higher. Take medicines only as told by your health care provider. Follow the directions carefully. Blood pressure medicines must be taken as prescribed. The medicine does not work as well when you skip doses. Skipping doses also puts you at risk for problems. Contact a health care provider if:  You think you are having a reaction to medicines you have taken.  You have repeated (recurrent) headaches.  You feel dizzy.  You have swelling in your ankles.  You have trouble with your vision. Get help right away if:  You develop a severe headache or confusion.  You have unusual weakness or numbness, or you feel faint.  You have severe pain in your chest or abdomen.  You vomit repeatedly.  You have trouble breathing. Summary  Hypertension is when the force of blood pumping  through your arteries is too strong. If this condition is not controlled, it may put you at risk for serious complications.  Your personal target blood pressure may vary depending on your medical conditions, your age, and other factors. For most people, a normal blood pressure is less than 120/80.  Hypertension is managed by lifestyle changes, medicines, or both. Lifestyle changes include weight loss, eating a healthy, low-sodium diet, exercising more, and limiting alcohol. This information is not intended to replace advice given to you by your health care provider. Make sure you discuss any questions you have with your health care provider. Document Revised: 09/11/2018 Document Reviewed: 04/17/2016 Elsevier Patient Education  2020 Elsevier Inc.  

## 2020-03-23 NOTE — Progress Notes (Signed)
Established Patient Office Visit  Subjective:  Patient ID: Charles Rowland, male    DOB: 06/19/1975  Age: 44 y.o. MRN: [id number]  CC:  Chief Complaint  Patient presents with   Follow-up    3 month follow up, no concerns.     HPI Charles Rowland presents for follow-up of hypertension.  The patient discontinued chlorthalidone because he developed tingling in his left leg  after starting it.  Tingling seemed to resolve after he discontinued that medication.  Continues olmesartan without issue.  History of lower back.  History of sciatica.  He has a new job with [employer].  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  His back is not bothering him in some time now.  He is aware of proper lifting technique.  Past Medical History:  Diagnosis Date   Cancer [facility])    Colon Dx 2019   Family history of breast cancer     Past Surgical History:  Procedure Laterality Date   BIOPSY  04/06/2018   Procedure: BIOPSY;  Surgeon: Ronnette Juniper, MD;  [facility];  Service: Gastroenterology;;   COLONOSCOPY WITH PROPOFOL N/A 04/06/2018   Procedure: COLONOSCOPY WITH PROPOFOL;  Surgeon: Ronnette Juniper, MD;  [facility];  Service: Gastroenterology;  Laterality: N/A;   LAPAROSCOPIC SMALL BOWEL RESECTION N/A 04/08/2018   Procedure: LAPAROSCOPIC ASSISTED LOW ANTERIOR RESECTION;  Surgeon: Jovita Kussmaul, MD;  Location: [facility];  Service: General;  Laterality: N/A;   POLYPECTOMY  04/06/2018   Procedure: POLYPECTOMY;  Surgeon: Ronnette Juniper, MD;  [facility];  Service: Gastroenterology;;   SUBMUCOSAL INJECTION  04/06/2018   Procedure: SUBMUCOSAL INJECTION;  Surgeon: Ronnette Juniper, MD;  [facility];  Service: Gastroenterology;;    Family History  Problem Relation Age of Onset   Lung cancer Other    Breast cancer Maternal Grandmother        d. 71   Cancer Maternal Grandfather        lung, smoker   Hypertension Mother    Hypertension Father     Kidney disease Maternal Aunt    CAD Neg Hx    Stroke Neg Hx     Social History   Socioeconomic History   Marital status: Married    Spouse name: Not on file   Number of children: Not on file   Years of education: Not on file   Highest education level: Not on file  Occupational History   Not on file  Tobacco Use   Smoking status: Never Smoker   Smokeless tobacco: Never Used  Vaping Use   Vaping Use: Never used  Substance and Sexual Activity   Alcohol use: Yes    Comment: occasional   Drug use: Never   Sexual activity: Yes  Other Topics Concern   Not on file  Social History Narrative   Not on file   Social Determinants of Health   Financial Resource Strain:    Difficulty of Paying Living Expenses: Not on file  Food Insecurity:    Worried About Running Out of Food in the Last Year: Not on file   [employer] of Food in the Last Year: Not on file  Transportation Needs:    Lack of Transportation (Medical): Not on file   Lack of Transportation (Non-Medical): Not on file  Physical Activity:    Days of Exercise per Week: Not on file   Minutes of Exercise per Session: Not on file  Stress:  Feeling of Stress : Not on file  Social Connections:    Frequency of Communication with Friends and Family: Not on file   Frequency of Social Gatherings with Friends and Family: Not on file   Attends Religious Services: Not on file   Active Member of Clubs or Organizations: Not on file   Attends Archivist Meetings: Not on file   Marital Status: Not on file  Intimate Partner Violence:    Fear of Current or Ex-Partner: Not on file   Emotionally Abused: Not on file   Physically Abused: Not on file   Sexually Abused: Not on file    Outpatient Medications Prior to Visit  Medication Sig Dispense Refill   psyllium (REGULOID) 0.52 g capsule Take 0.52 g by mouth daily.     VITAMIN D, CHOLECALCIFEROL, PO Take 50 mcg by mouth daily.       olmesartan (BENICAR) 40 MG tablet Take 1 tablet (40 mg total) by mouth daily. 100 tablet 1   Lactobacillus (PROBIOTIC ACIDOPHILUS PO) Take 1 tablet by mouth daily. (Patient not taking: Reported on 03/23/2020)     chlorthalidone (HYGROTON) 25 MG tablet Take 1 tablet (25 mg total) by mouth daily. (Patient not taking: Reported on 03/23/2020) 100 tablet 1   No facility-administered medications prior to visit.    No Known Allergies  ROS Review of Systems  Constitutional: Negative.   HENT: Negative.   Eyes: Negative for photophobia and visual disturbance.  Respiratory: Negative.   Cardiovascular: Negative.   Gastrointestinal: Negative.   Endocrine: Negative for polyphagia and polyuria.  Genitourinary: Negative.   Musculoskeletal: Negative for back pain, gait problem and joint swelling.  Neurological: Negative for weakness and numbness.  Psychiatric/Behavioral: Negative.       Objective:    Physical Exam Vitals and nursing note reviewed.  Constitutional:      General: He is not in acute distress.    Appearance: Normal appearance. He is not ill-appearing, toxic-appearing or diaphoretic.  HENT:     Head: Normocephalic and atraumatic.     Right Ear: External ear normal.     Left Ear: External ear normal.  Eyes:     General:        Right eye: No discharge.        Left eye: No discharge.     Conjunctiva/sclera: Conjunctivae normal.  Cardiovascular:     Rate and Rhythm: Normal rate and regular rhythm.  Pulmonary:     Effort: Pulmonary effort is normal.     Breath sounds: Normal breath sounds.  Abdominal:     General: Bowel sounds are normal.  Musculoskeletal:     Lumbar back: No swelling, deformity, spasms or tenderness. Normal range of motion. Negative right straight leg raise test and negative left straight leg raise test.  Neurological:     Mental Status: He is alert and oriented to person, place, and time.     Motor: Motor function is intact.  Psychiatric:        Mood  and Affect: Mood normal.        Behavior: Behavior normal.     BP (!) 132/96    Pulse 85    Temp 97.7 F (36.5 C) (Tympanic)    Ht 5\' 10"  (1.778 m)    Wt 204 lb 3.2 oz (92.6 kg)    SpO2 96%    BMI 29.30 kg/m  Wt Readings from Last 3 Encounters:  03/23/20 204 lb 3.2 oz (92.6 kg)  12/21/19 206 lb  6.4 oz (93.6 kg)  10/11/19 206 lb 8 oz (93.7 kg)     Health Maintenance Due  Topic Date Due   Hepatitis C Screening  Never done   INFLUENZA VACCINE  01/02/2020    There are no preventive care reminders to display for this patient.  Lab Results  Component Value Date   TSH 3.88 12/29/2018   Lab Results  Component Value Date   WBC 4.5 09/22/2019   HGB 14.8 09/22/2019   HCT 41.7 09/22/2019   MCV 88.7 09/22/2019   PLT 169.0 09/22/2019   Lab Results  Component Value Date   NA 136 12/21/2019   K 4.0 12/21/2019   CO2 26 12/21/2019   GLUCOSE 100 (H) 12/21/2019   BUN 15 12/21/2019   CREATININE 1.18 12/21/2019   BILITOT 0.6 09/22/2019   ALKPHOS 57 09/22/2019   AST 17 09/22/2019   ALT 16 09/22/2019   PROT 7.6 09/22/2019   ALBUMIN 4.6 09/22/2019   CALCIUM 9.8 12/21/2019   ANIONGAP 9 04/09/2018   GFR 67.80 12/21/2019   Lab Results  Component Value Date   CHOL 193 09/22/2019   Lab Results  Component Value Date   HDL 28.60 (L) 09/22/2019   Lab Results  Component Value Date   LDLCALC 129 (H) 09/22/2019   Lab Results  Component Value Date   TRIG 177.0 (H) 09/22/2019   Lab Results  Component Value Date   CHOLHDL 7 09/22/2019   Lab Results  Component Value Date   HGBA1C 4.9 04/07/2018      Assessment & Plan:   Problem List Items Addressed This Visit      Cardiovascular and Mediastinum   Essential hypertension - Primary   Relevant Medications   hydrochlorothiazide (HYDRODIURIL) 25 MG tablet   olmesartan (BENICAR) 40 MG tablet   Other Relevant Orders   Basic metabolic panel      Meds ordered this encounter  Medications   hydrochlorothiazide  (HYDRODIURIL) 25 MG tablet    Sig: Take 1 tablet (25 mg total) by mouth daily.    Dispense:  100 tablet    Refill:  1   olmesartan (BENICAR) 40 MG tablet    Sig: Take 1 tablet (40 mg total) by mouth daily.    Dispense:  100 tablet    Refill:  1    Follow-up: Return in about 6 months (around 09/21/2020).  Will let me know if there is any problems with HCTZ.  Warned him about urinary frequency and possibly transient ED.  Libby Maw, MD

## 2020-04-18 IMAGING — CT CT ABD-PELV W/ CM
2 of 4 series · 14 of 36 positions shown, 17 images · IV contrast (APPLIED)
Comparison: None.

CLINICAL DATA: 41-year-old male with newly diagnosed rectal mass at
colonoscopy today.. Evaluate for metastatic disease.

EXAM:
CT CHEST, ABDOMEN, AND PELVIS WITH CONTRAST
TECHNIQUE: Multidetector CT imaging of the chest, abdomen and pelvis was
performed following the standard protocol during bolus
administration of intravenous contrast.
CONTRAST:  100mL OMNIPAQUE IOHEXOL 300 MG/ML  SOLN

[Series 2: cap with · axial · 0.88mm/px · z∈[-491,+134]mm · 11 of 149 slices shown, 14 images]
[im 12/149  mediastinal]
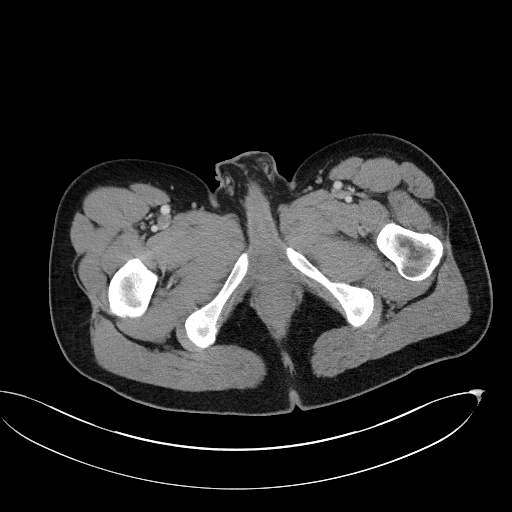
[im 12/149  lung]
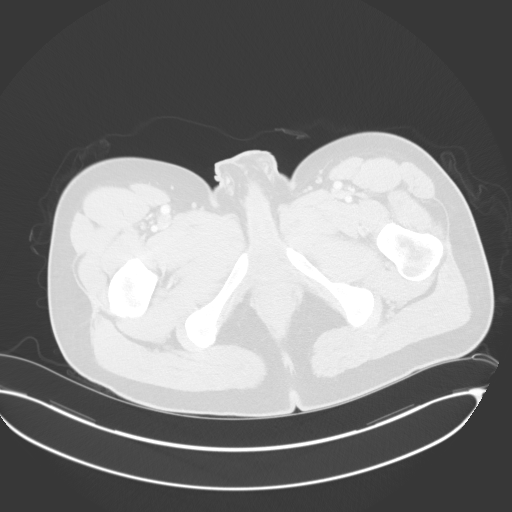
[im 23/149  lung]
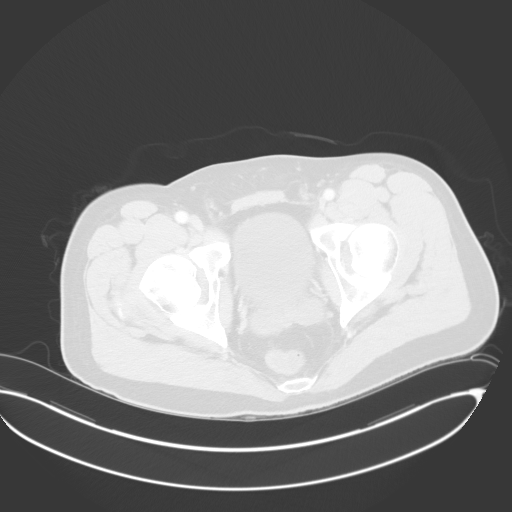
[im 35/149  lung]
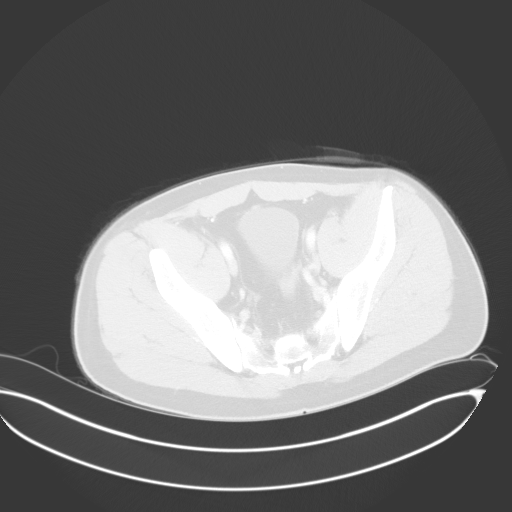
[im 46/149  lung]
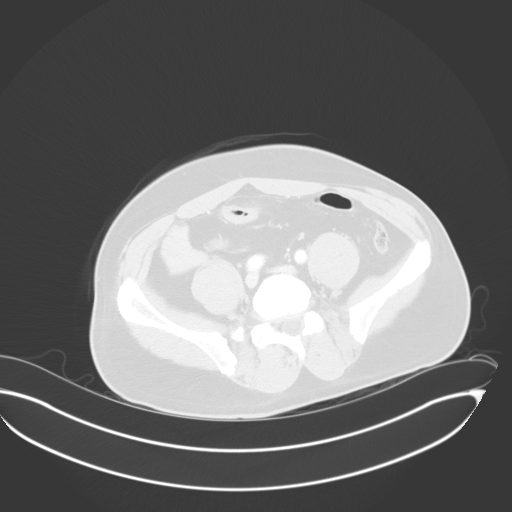
[im 57/149  mediastinal]
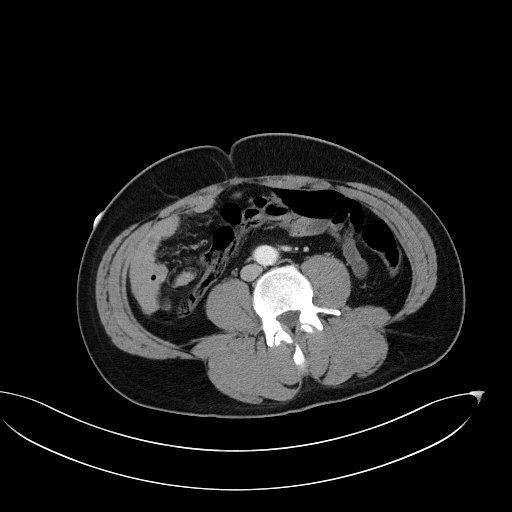
[im 57/149  lung]
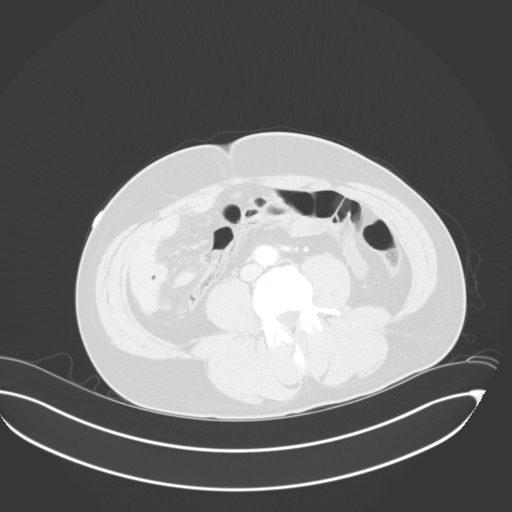
[im 80/149  lung]
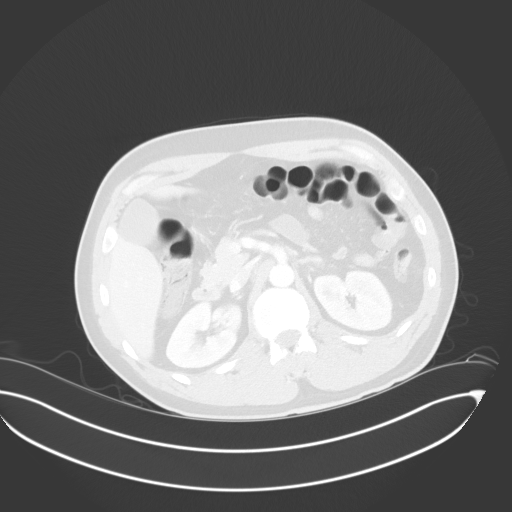
[im 92/149  lung]
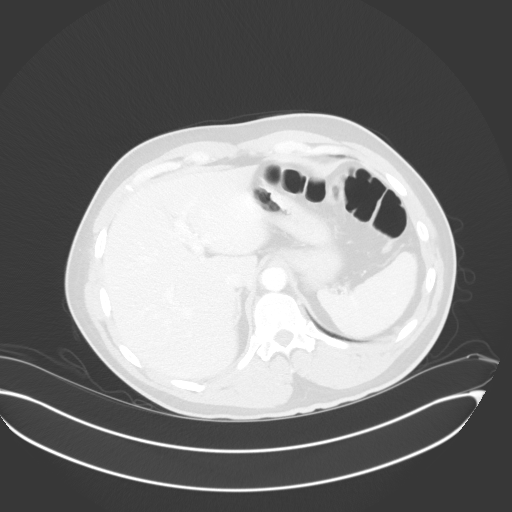
[im 103/149  lung]
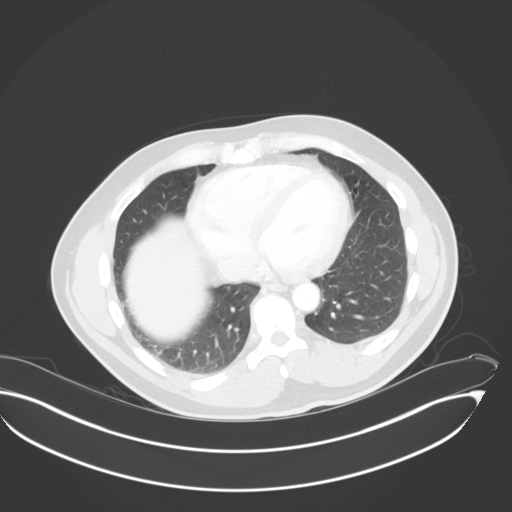
[im 114/149  mediastinal]
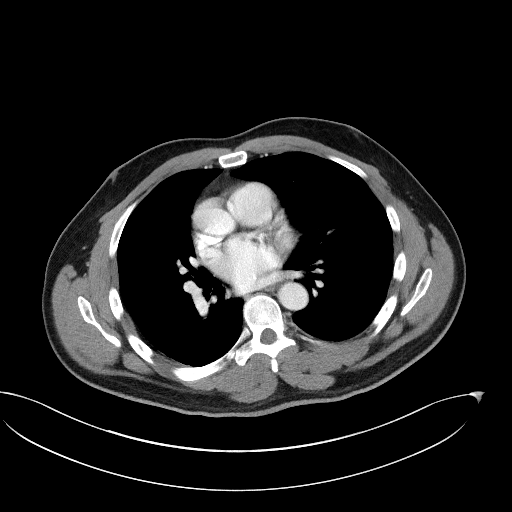
[im 114/149  lung]
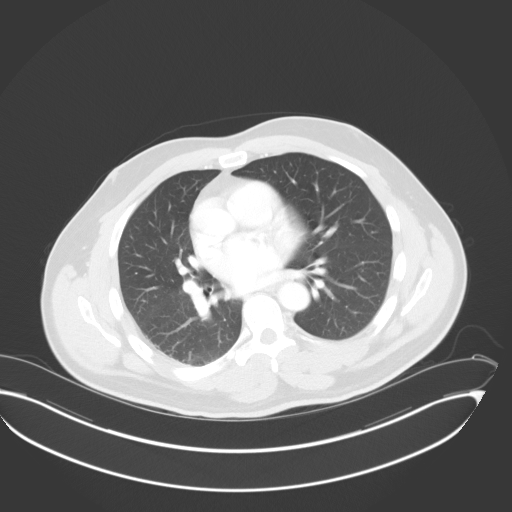
[im 126/149  lung]
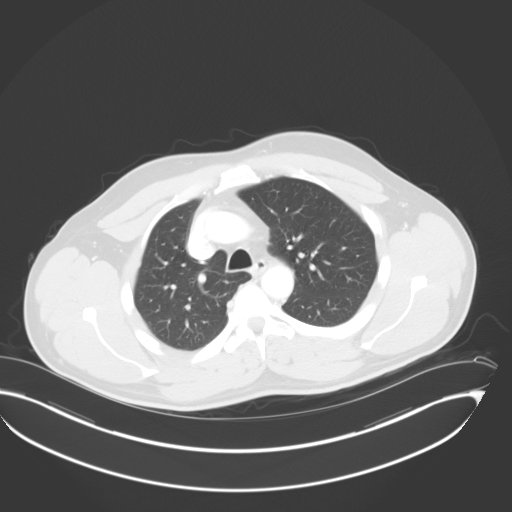
[im 137/149  lung]
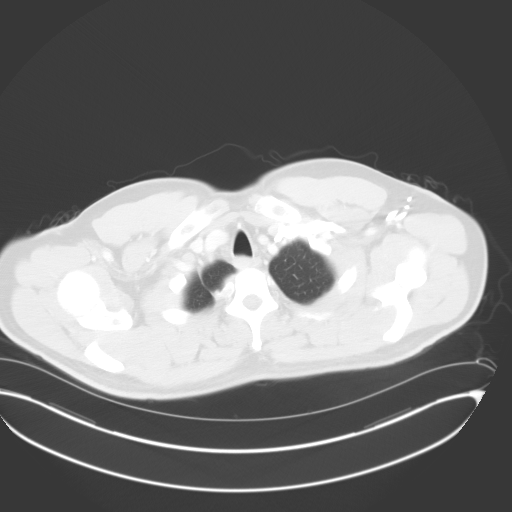

[Series 5: coronals · coronal · 0.89mm/px · 3 of 143 slices shown]
[im 29/143  lung]
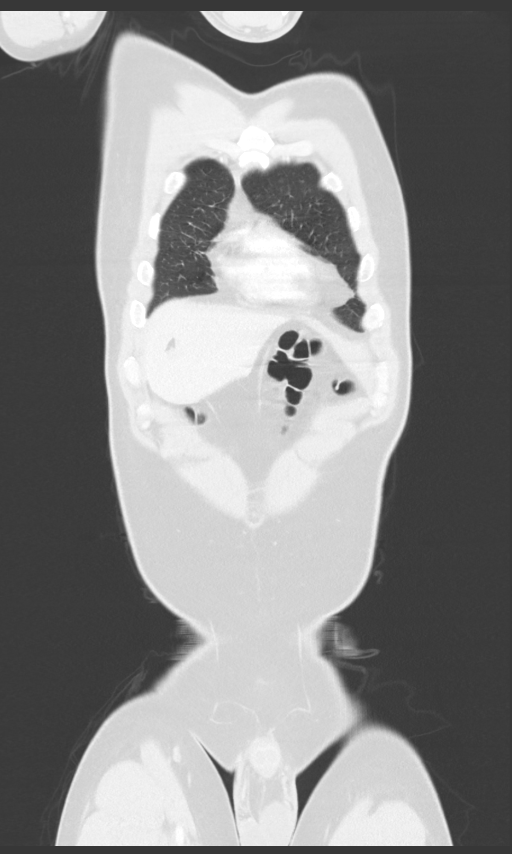
[im 57/143  lung]
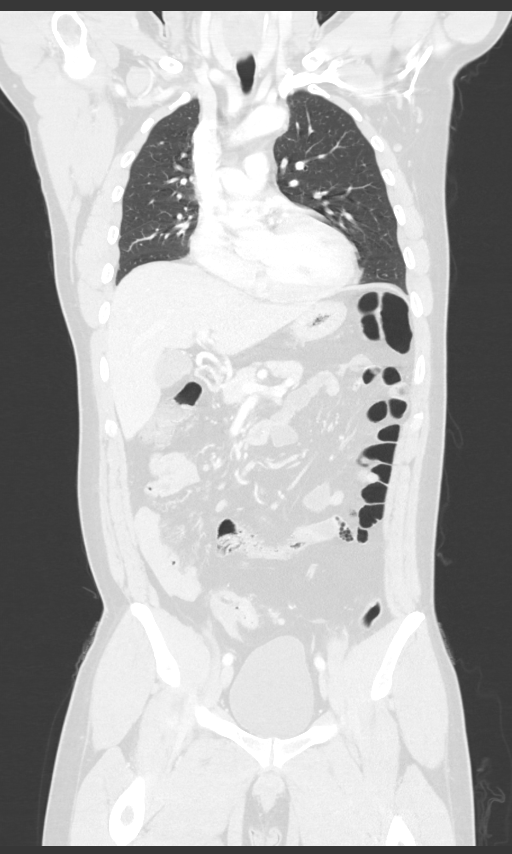
[im 86/143  lung]
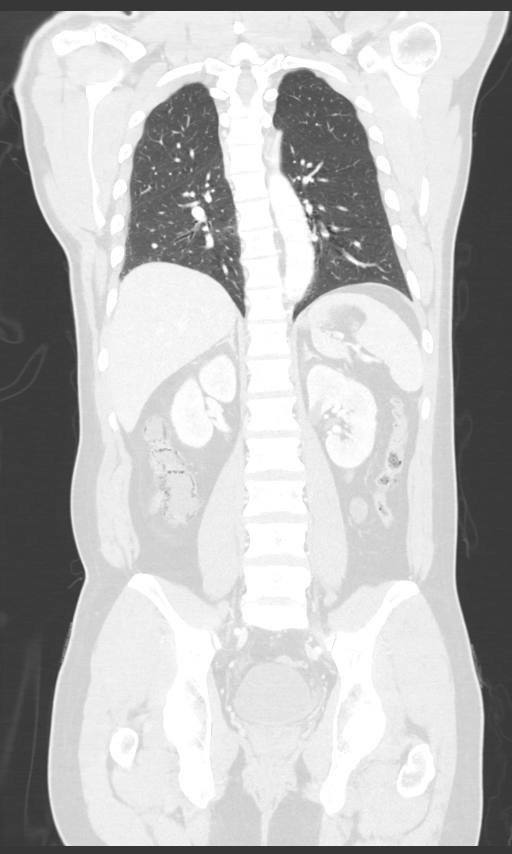

[14 of 36 positions shown; findings below may reference images not displayed]

FINDINGS: CT CHEST FINDINGS

Cardiovascular: No significant vascular findings. Normal heart size.
No pericardial effusion.

Mediastinum/Nodes: No enlarged mediastinal, hilar, or axillary lymph
nodes. Thyroid gland, trachea, and esophagus demonstrate no
significant findings.

Lungs/Pleura: Lungs are clear. No pulmonary nodules or masses. No
pleural effusion or pneumothorax.

Musculoskeletal: No chest wall mass or suspicious bone lesions
identified.

CT ABDOMEN PELVIS FINDINGS

Hepatobiliary: The liver and gallbladder are unremarkable. No focal
hepatic lesions identified. No biliary dilatation.

Pancreas: Unremarkable

Spleen: Unremarkable

Adrenals/Urinary Tract: The kidneys, adrenal glands and bladder are
unremarkable except for bilateral renal cysts.

Stomach/Bowel: Apparent circumferential wall thickening of the
sigmoid colon (series 2: Image [DATE] represent this patient's
known colonic mass. No adjacent enlarged lymph nodes. No other bowel
abnormalities are identified.

Vascular/Lymphatic: No significant vascular findings are present. No
enlarged abdominal or pelvic lymph nodes.

Reproductive: Prostate is unremarkable.

Other: No ascites, pneumoperitoneum, focal collection or
peritoneal/omental nodularity.

Musculoskeletal: No acute or suspicious bony abnormalities.
IMPRESSION: 1. Apparent circumferential wall thickening of the sigmoid colon
which may represent this patient's known colonic mass. No evidence
of enlarged lymph nodes or metastatic disease.
2. No other significant abnormalities.

## 2020-04-21 ENCOUNTER — Inpatient Hospital Stay: Payer: Self-pay

## 2020-04-21 ENCOUNTER — Inpatient Hospital Stay: Payer: Self-pay | Admitting: Oncology

## 2020-06-15 ENCOUNTER — Other Ambulatory Visit: Payer: Self-pay

## 2020-06-16 ENCOUNTER — Other Ambulatory Visit: Payer: Self-pay

## 2020-06-16 ENCOUNTER — Inpatient Hospital Stay: Payer: 59 | Attending: Oncology

## 2020-06-16 ENCOUNTER — Inpatient Hospital Stay (HOSPITAL_BASED_OUTPATIENT_CLINIC_OR_DEPARTMENT_OTHER): Payer: 59 | Admitting: Oncology

## 2020-06-16 VITALS — BP 117/86 | HR 94 | Temp 97.9°F | Resp 15 | Ht 70.0 in | Wt 187.1 lb

## 2020-06-16 DIAGNOSIS — C187 Malignant neoplasm of sigmoid colon: Secondary | ICD-10-CM

## 2020-06-16 DIAGNOSIS — Z862 Personal history of diseases of the blood and blood-forming organs and certain disorders involving the immune mechanism: Secondary | ICD-10-CM | POA: Insufficient documentation

## 2020-06-16 DIAGNOSIS — Z85038 Personal history of other malignant neoplasm of large intestine: Secondary | ICD-10-CM | POA: Insufficient documentation

## 2020-06-16 DIAGNOSIS — Z79899 Other long term (current) drug therapy: Secondary | ICD-10-CM | POA: Insufficient documentation

## 2020-06-16 DIAGNOSIS — I10 Essential (primary) hypertension: Secondary | ICD-10-CM

## 2020-06-16 LAB — CEA (IN HOUSE-CHCC): CEA (CHCC-In House): 3.82 ng/mL (ref 0.00–5.00)

## 2020-06-16 NOTE — Progress Notes (Signed)
  Charles Rowland   Diagnosis: Colon cancer  INTERVAL HISTORY:   Charles Rowland reports feeling well.  Good appetite and energy level.  He relates weight loss to an increased activity level and not eating lunch with his new job.  No difficulty with bowel or bladder function.  No bleeding.  He has noted decreased visual acuity recently.  He started using reading glasses.  He has noted a nodular lesion at the right frontoparietal scalp.  Objective:  Vital signs in last 24 hours:  Blood pressure 117/86, pulse 94, temperature 97.9 F (36.6 C), temperature source Tympanic, resp. rate 15, height $RemoveBe'5\' 10"'bdugPlITJ$  (1.778 m), weight 187 lb 1.6 oz (84.9 kg), SpO2 100 %.    Lymphatics: No cervical, supraclavicular, axillary, or inguinal nodes Resp: Lungs clear bilaterally Cardio: Regular rate and rhythm GI: No mass, nontender, no hepatosplenomegaly Vascular: No leg edema  Skin: 4-5 mm erythematous pustular lesion at the right frontoparietal scalp  Lab Results:   Lab Results  Component Value Date   CEA1 3.42 10/11/2019     Medications: I have reviewed the patient's current medications.   Assessment/Plan: 1.  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  History of iron deficiency anemia secondary to GI blood loss and tumor bleeding  3.  Left leg pain December 2019-negative Doppler, resolved 4.  Hypertension   Disposition: Charles Rowland is in clinical remission from colon cancer.  We will follow-up on the CEA from today.  He will return for an office visit in 6 months.  He appears to have a benign furuncle/inflamed cystic lesion at the right forehead.  He will follow-up with his primary provider if this lesion does not improve.  Betsy Coder, MD  06/16/2020  8:15 AM

## 2020-06-19 ENCOUNTER — Telehealth: Payer: Self-pay | Admitting: Oncology

## 2020-06-19 NOTE — Telephone Encounter (Signed)
Scheduled appointments per 1/14 los. Called patient, no answer. Left message with appointments date and times.

## 2020-09-18 ENCOUNTER — Encounter: Payer: Self-pay | Admitting: Oncology

## 2020-09-21 ENCOUNTER — Ambulatory Visit: Payer: 59 | Admitting: Family Medicine

## 2020-12-14 ENCOUNTER — Inpatient Hospital Stay: Payer: 59 | Admitting: Oncology

## 2020-12-14 ENCOUNTER — Other Ambulatory Visit: Payer: 59

## 2020-12-14 ENCOUNTER — Inpatient Hospital Stay: Payer: 59

## 2020-12-19 ENCOUNTER — Inpatient Hospital Stay (HOSPITAL_BASED_OUTPATIENT_CLINIC_OR_DEPARTMENT_OTHER): Payer: 59 | Admitting: Oncology

## 2020-12-19 ENCOUNTER — Inpatient Hospital Stay: Payer: 59 | Attending: Oncology

## 2020-12-19 ENCOUNTER — Other Ambulatory Visit: Payer: Self-pay

## 2020-12-19 VITALS — BP 150/100 | HR 69 | Temp 97.8°F | Resp 19 | Ht 70.0 in | Wt 177.6 lb

## 2020-12-19 DIAGNOSIS — C187 Malignant neoplasm of sigmoid colon: Secondary | ICD-10-CM | POA: Diagnosis not present

## 2020-12-19 DIAGNOSIS — I10 Essential (primary) hypertension: Secondary | ICD-10-CM | POA: Insufficient documentation

## 2020-12-19 LAB — CEA (ACCESS): CEA (CHCC): 4.35 ng/mL (ref 0.00–5.00)

## 2020-12-19 NOTE — Progress Notes (Signed)
  [facility] OFFICE PROGRESS NOTE   Diagnosis: Colon cancer  INTERVAL HISTORY:   Charles Rowland returns as scheduled.  He feels well.  He has a good appetite.  He relates weight loss to walking 10 to 15 miles per day at work.  He often skips meals at work.  He had an episode of blood when wiping 1-2 months ago.  This has not recurred.  No difficulty with bowel movements.  He has not taken blood pressure medication for several months.  Objective:  Vital signs in last 24 hours:  Blood pressure (!) 158/124, pulse 69, temperature 97.8 F (36.6 C), temperature source Oral, resp. rate 19, height _0  (1.778 m), weight 177 lb 9.6 oz (80.6 kg), SpO2 100 %.     Lymphatics: No cervical, supraclavicular, or inguinal nodes.  "Shotty "bilateral axillary nodes. Resp: Lungs clear bilaterally Cardio: Regular rate and rhythm GI: No hepatosplenomegaly, nontender, no mass Vascular: No leg edema   Lab Results:  Lab Results  Component Value Date   WBC 4.5 09/22/2019   HGB 14.8 09/22/2019   HCT 41.7 09/22/2019   MCV 88.7 09/22/2019   PLT 169.0 09/22/2019   NEUTROABS 2,116 12/29/2018    CMP  Lab Results  Component Value Date   NA 136 12/21/2019   K 4.0 12/21/2019   CL 103 12/21/2019   CO2 26 12/21/2019   GLUCOSE 100 (H) 12/21/2019   BUN 15 12/21/2019   CREATININE 1.18 12/21/2019   CALCIUM 9.8 12/21/2019   PROT 7.6 09/22/2019   ALBUMIN 4.6 09/22/2019   AST 17 09/22/2019   ALT 16 09/22/2019   ALKPHOS 57 09/22/2019   BILITOT 0.6 09/22/2019   GFRNONAA >60 04/09/2018   GFRAA >60 04/09/2018    Lab Results  Component Value Date   CEA1 3.82 06/16/2020   Medications: I have reviewed the patient's current medications.   Assessment/Plan:  1.  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  History of iron deficiency  anemia secondary to GI blood loss and tumor bleeding  3.  Left leg pain December 2019-negative Doppler, resolved 4.  Hypertension   Disposition: Charles Rowland is in clinical remission from colon cancer.  We will follow-up on the CEA from today.  He will return for an office visit and CEA in 6 months.  He will be due for a surveillance colonoscopy in 2023.  He will follow-up with Dr. Therisa Doyne if he has another episode of rectal bleeding.  He has hypertension and is currently not taking his blood pressure medication.  We refilled his blood pressure medications for a 2-week supply.  I commended he schedule a follow-up appoint with Dr. Ethelene Hal for this week.  Betsy Coder, MD  12/19/2020  1:50 PM

## 2020-12-20 ENCOUNTER — Telehealth: Payer: Self-pay

## 2020-12-20 ENCOUNTER — Encounter: Payer: Self-pay | Admitting: Nurse Practitioner

## 2020-12-20 ENCOUNTER — Ambulatory Visit (INDEPENDENT_AMBULATORY_CARE_PROVIDER_SITE_OTHER): Payer: 59 | Admitting: Nurse Practitioner

## 2020-12-20 DIAGNOSIS — I10 Essential (primary) hypertension: Secondary | ICD-10-CM

## 2020-12-20 LAB — CEA (IN HOUSE-CHCC): CEA (CHCC-In House): 4.91 ng/mL (ref 0.00–5.00)

## 2020-12-20 MED ORDER — OLMESARTAN MEDOXOMIL 20 MG PO TABS: 20.0000 mg | ORAL_TABLET | Freq: Every day | ORAL | 1 refills | Status: DC

## 2020-12-20 NOTE — Telephone Encounter (Signed)
VM message left for Pt relaying CEA results(4.35) informed Pt per Dr Benay Spice result is upper normal range. Likely a benign finding and will repeat labs in 3 months. Pt informed to return call if any questions or concerns.

## 2020-12-20 NOTE — Telephone Encounter (Signed)
-----   Message from Ladell Pier, MD sent at 12/20/2020  1:28 PM EDT ----- Please call patient, CEA is in upper end of normal range, likely a benign finding, repeat in 3 months

## 2020-12-20 NOTE — Patient Instructions (Signed)
Start olmesartan 20mg  daily Maintain DASh diet and adequate oral hydration F/up with Dr. Ethelene Hal in 36month.

## 2020-12-20 NOTE — Progress Notes (Signed)
   Subjective:  Patient ID: Charles Rowland, male    DOB: 06-Jul-1975  Age: 45 y.o. MRN: [id number]  CC: Follow-up (Pt would like to discuss HTN and states he has been having elevated BP readings when going to his cancer doctor and a few times at home. )  HPI  Essential hypertension BP not at goal He discontinue olmesartan over 56months ago because he thought it was not needed. He is will ing to resume medication due to recent elevated readings BP Readings from Last 3 Encounters:  12/20/20 (!) 136/98  12/19/20 (!) 150/100  06/16/20 117/86   rx sent F/up in 59month with pcp   Reviewed past Medical, Social and Family history today.  Outpatient Medications Prior to Visit  Medication Sig Dispense Refill   VITAMIN D PO Take by mouth.     psyllium (REGULOID) 0.52 g capsule Take 0.52 g by mouth daily.     hydrochlorothiazide (HYDRODIURIL) 25 MG tablet Take 1 tablet (25 mg total) by mouth daily. 100 tablet 1   Lactobacillus (PROBIOTIC ACIDOPHILUS PO) Take 1 tablet by mouth daily. (Patient not taking: No sig reported)     olmesartan (BENICAR) 40 MG tablet Take 1 tablet (40 mg total) by mouth daily. 100 tablet 1   VITAMIN D, CHOLECALCIFEROL, PO Take 50 mcg by mouth daily.      No facility-administered medications prior to visit.    ROS See HPI  Objective:  BP (!) 136/98 (BP Location: Left Arm, Patient Position: Sitting, Cuff Size: Large)   Pulse 66   Temp 97.8 F (36.6 C) (Temporal)   Ht 5\' 10"  (1.778 m)   Wt 181 lb (82.1 kg)   SpO2 99%   BMI 25.97 kg/m   Physical Exam Vitals reviewed.  Cardiovascular:     Pulses: Normal pulses.     Heart sounds: Normal heart sounds.  Pulmonary:     Effort: Pulmonary effort is normal.     Breath sounds: Normal breath sounds.  Musculoskeletal:     Right lower leg: No edema.     Left lower leg: No edema.  Neurological:     Mental Status: He is alert and oriented to person, place, and time.   Assessment & Plan:  This visit occurred during  the SARS-CoV-2 public health emergency.  Safety protocols were in place, including screening questions prior to the visit, additional usage of staff PPE, and extensive cleaning of exam room while observing appropriate contact time as indicated for disinfecting solutions.   Charles Rowland was seen today for follow-up.  Diagnoses and all orders for this visit:  Essential hypertension -     olmesartan (BENICAR) 20 MG tablet; Take 1 tablet (20 mg total) by mouth daily.  Problem List Items Addressed This Visit       Cardiovascular and Mediastinum   Essential hypertension    BP not at goal He discontinue olmesartan over 20months ago because he thought it was not needed. He is will ing to resume medication due to recent elevated readings BP Readings from Last 3 Encounters:  12/20/20 (!) 136/98  12/19/20 (!) 150/100  06/16/20 117/86   rx sent F/up in 72month with pcp      Relevant Medications   olmesartan (BENICAR) 20 MG tablet    Follow-up: Return in about 4 weeks (around 01/17/2021) for HTN with Dr. Ethelene Hal.  Wilfred Lacy, NP

## 2020-12-22 ENCOUNTER — Encounter: Payer: Self-pay | Admitting: Nurse Practitioner

## 2020-12-22 ENCOUNTER — Encounter: Payer: Self-pay | Admitting: Oncology

## 2020-12-22 NOTE — Assessment & Plan Note (Signed)
BP not at goal He discontinue olmesartan over 85month ago because he thought it was not needed. He is will ing to resume medication due to recent elevated readings BP Readings from Last 3 Encounters:  12/20/20 (!) 136/98  12/19/20 (!) 150/100  06/16/20 117/86   rx sent F/up in 170monthith pcp

## 2021-01-23 ENCOUNTER — Ambulatory Visit: Payer: 59 | Admitting: Family Medicine

## 2021-01-26 ENCOUNTER — Other Ambulatory Visit: Payer: Self-pay

## 2021-01-26 ENCOUNTER — Ambulatory Visit (INDEPENDENT_AMBULATORY_CARE_PROVIDER_SITE_OTHER): Payer: 59 | Admitting: Family Medicine

## 2021-01-26 ENCOUNTER — Encounter: Payer: Self-pay | Admitting: Family Medicine

## 2021-01-26 VITALS — BP 136/80 | HR 81 | Temp 97.8°F | Ht 70.0 in | Wt 183.6 lb

## 2021-01-26 DIAGNOSIS — H6123 Impacted cerumen, bilateral: Secondary | ICD-10-CM | POA: Diagnosis not present

## 2021-01-26 DIAGNOSIS — I10 Essential (primary) hypertension: Secondary | ICD-10-CM

## 2021-01-26 LAB — BASIC METABOLIC PANEL
BUN: 14 mg/dL (ref 6–23)
CO2: 27 mEq/L (ref 19–32)
Calcium: 9.6 mg/dL (ref 8.4–10.5)
Chloride: 104 mEq/L (ref 96–112)
Creatinine, Ser: 0.99 mg/dL (ref 0.40–1.50)
GFR: 93.77 mL/min (ref >60.00)
Glucose, Bld: 74 mg/dL (ref 70–99)
Potassium: 4.1 mEq/L (ref 3.5–5.1)
Sodium: 139 mEq/L (ref 135–145)

## 2021-01-26 MED ORDER — OLMESARTAN MEDOXOMIL 20 MG PO TABS: 20.0000 mg | ORAL_TABLET | Freq: Every day | ORAL | 2 refills | Status: DC

## 2021-01-26 MED ORDER — DEBROX 6.5 % OT SOLN: 5.0000 [drp] | Freq: Two times a day (BID) | OTIC | 5 refills | Status: DC

## 2021-01-26 NOTE — Progress Notes (Signed)
Established Patient Office Visit  Subjective:  Patient ID: Charles Rowland, male    DOB: [birth year]  Age: 45 y.o. MRN: [id number]  CC:  Chief Complaint  Patient presents with   Follow-up    4 week follow up on BP, no concerns.     HPI Charles Rowland presents for follow-up of hypertension.  Recently restarted on olmesartan.  Having no issues taking it.  Denies headaches or blurred visit.  Add some salt to his food.  Rarely drinks alcohol.  He does not smoke or use illicit drugs.  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  Status post episode BR PR with uncomfortable bowel movement.  Has seen GI and colonoscopy is planned.  CBC last week was essentially normal.  Past Medical History:  Diagnosis Date   Cancer [facility])    Colon Dx 2019   Family history of breast cancer     Past Surgical History:  Procedure Laterality Date   BIOPSY  04/06/2018   Procedure: BIOPSY;  Surgeon: Ronnette Juniper, MD;  Location: [facility];  Service: Gastroenterology;;   COLONOSCOPY WITH PROPOFOL N/A 04/06/2018   Procedure: COLONOSCOPY WITH PROPOFOL;  Surgeon: Ronnette Juniper, MD;  Location: [facility];  Service: Gastroenterology;  Laterality: N/A;   LAPAROSCOPIC SMALL BOWEL RESECTION N/A 04/08/2018   Procedure: LAPAROSCOPIC ASSISTED LOW ANTERIOR RESECTION;  Surgeon: Jovita Kussmaul, MD;  Location: [facility];  Service: General;  Laterality: N/A;   POLYPECTOMY  04/06/2018   Procedure: POLYPECTOMY;  Surgeon: Ronnette Juniper, MD;  Location: [facility];  Service: Gastroenterology;;   SUBMUCOSAL INJECTION  04/06/2018   Procedure: SUBMUCOSAL INJECTION;  Surgeon: Ronnette Juniper, MD;  Location: [facility];  Service: Gastroenterology;;    Family History  Problem Relation Age of Onset   Lung cancer Other    Breast cancer Maternal Grandmother        d. 27   Cancer Maternal Grandfather        lung, smoker   Hypertension Mother    Hypertension Father    Kidney disease Maternal Aunt    CAD Neg Hx     Stroke Neg Hx     Social History   Socioeconomic History   Marital status: Married    Spouse name: Not on file   Number of children: Not on file   Years of education: Not on file   Highest education level: Not on file  Occupational History   Not on file  Tobacco Use   Smoking status: Never   Smokeless tobacco: Never  Vaping Use   Vaping Use: Never used  Substance and Sexual Activity   Alcohol use: Yes    Comment: occasional   Drug use: Never   Sexual activity: Yes  Other Topics Concern   Not on file  Social History Narrative   Not on file   Social Determinants of Health   Financial Resource Strain: Not on file  Food Insecurity: Not on file  Transportation Needs: Not on file  Physical Activity: Not on file  Stress: Not on file  Social Connections: Not on file  Intimate Partner Violence: Not on file    Outpatient Medications Prior to Visit  Medication Sig Dispense Refill   nystatin cream (MYCOSTATIN) Apply topically 2 (two) times daily.     olmesartan (BENICAR) 20 MG tablet Take 1 tablet (20 mg total) by mouth daily. 90 tablet 1   VITAMIN D PO Take by mouth.  No facility-administered medications prior to visit.    No Known Allergies  ROS Review of Systems  Constitutional: Negative.   Respiratory: Negative.    Cardiovascular: Negative.   Gastrointestinal:  Positive for anal bleeding. Negative for abdominal pain, blood in stool and constipation.  Neurological:  Negative for headaches.  Psychiatric/Behavioral: Negative.       Objective:    Physical Exam Vitals and nursing note reviewed.  Constitutional:      General: He is not in acute distress.    Appearance: Normal appearance. He is normal weight. He is not ill-appearing or toxic-appearing.  HENT:     Head: Normocephalic and atraumatic.     Right Ear: There is impacted cerumen.     Left Ear: There is impacted cerumen.     Mouth/Throat:     Mouth: Mucous membranes are moist.     Pharynx:  Oropharynx is clear. No oropharyngeal exudate or posterior oropharyngeal erythema.  Eyes:     General: No scleral icterus.       Right eye: No discharge.        Left eye: No discharge.     Extraocular Movements: Extraocular movements intact.     Conjunctiva/sclera: Conjunctivae normal.     Pupils: Pupils are equal, round, and reactive to light.  Neck:     Vascular: No carotid bruit.  Cardiovascular:     Rate and Rhythm: Normal rate and regular rhythm.  Pulmonary:     Effort: Pulmonary effort is normal.  Abdominal:     General: Bowel sounds are normal.  Musculoskeletal:     Cervical back: No rigidity or tenderness.  Lymphadenopathy:     Cervical: No cervical adenopathy.  Skin:    General: Skin is warm and dry.  Neurological:     Mental Status: He is alert and oriented to person, place, and time.    BP 136/80 (BP Location: Left Arm, Patient Position: Sitting, Cuff Size: Normal)   Pulse 81   Temp 97.8 F (36.6 C) (Temporal)   Ht '5\' 10"'$  (1.778 m)   Wt 183 lb 9.6 oz (83.3 kg)   SpO2 97%   BMI 26.34 kg/m  Wt Readings from Last 3 Encounters:  01/26/21 183 lb 9.6 oz (83.3 kg)  12/20/20 181 lb (82.1 kg)  12/19/20 177 lb 9.6 oz (80.6 kg)     Health Maintenance Due  Topic Date Due   Pneumococcal Vaccine 24-54 Years old (1 - PCV) Never done   Hepatitis C Screening  Never done   INFLUENZA VACCINE  01/01/2021    There are no preventive care reminders to display for this patient.  Lab Results  Component Value Date   TSH 3.88 12/29/2018   Lab Results  Component Value Date   WBC 4.5 09/22/2019   HGB 14.8 09/22/2019   HCT 41.7 09/22/2019   MCV 88.7 09/22/2019   PLT 169.0 09/22/2019   Lab Results  Component Value Date   NA 136 12/21/2019   K 4.0 12/21/2019   CO2 26 12/21/2019   GLUCOSE 100 (H) 12/21/2019   BUN 15 12/21/2019   CREATININE 1.18 12/21/2019   BILITOT 0.6 09/22/2019   ALKPHOS 57 09/22/2019   AST 17 09/22/2019   ALT 16 09/22/2019   PROT 7.6 09/22/2019    ALBUMIN 4.6 09/22/2019   CALCIUM 9.8 12/21/2019   ANIONGAP 9 04/09/2018   GFR 67.80 12/21/2019   Lab Results  Component Value Date   CHOL 193 09/22/2019   Lab Results  Component  Value Date   HDL 28.60 (L) 09/22/2019   Lab Results  Component Value Date   LDLCALC 129 (H) 09/22/2019   Lab Results  Component Value Date   TRIG 177.0 (H) 09/22/2019   Lab Results  Component Value Date   CHOLHDL 7 09/22/2019   Lab Results  Component Value Date   HGBA1C 4.9 04/07/2018      Assessment & Plan:   Problem List Items Addressed This Visit       Cardiovascular and Mediastinum   Essential hypertension - Primary   Relevant Medications   olmesartan (BENICAR) 20 MG tablet   Other Relevant Orders   Basic metabolic panel   Other Visit Diagnoses     Excessive cerumen in both ear canals       Relevant Medications   carbamide peroxide (DEBROX) 6.5 % OTIC solution       Meds ordered this encounter  Medications   olmesartan (BENICAR) 20 MG tablet    Sig: Take 1 tablet (20 mg total) by mouth daily.    Dispense:  90 tablet    Refill:  2   carbamide peroxide (DEBROX) 6.5 % OTIC solution    Sig: Place 5 drops into both ears 2 (two) times daily.    Dispense:  15 mL    Refill:  5     Follow-up: Return in about 6 months (around 07/29/2021), or Return for ear irrigation if needed.  Return for physical in the next 6 to 7 months., for Check and record blood pressures periodically..  Use Debrox drops twice daily.  Avoid Q-tips.  Information was given on cerumen impaction and ear irrigation.  Return as needed for ear irrigation.  Information given on managing hypertension.  Continue olmesartan.  Check and record blood pressures periodically.  Return for physical in the next 6 to 7 months.  Libby Maw, MD

## 2021-02-08 LAB — HM COLONOSCOPY

## 2021-02-23 ENCOUNTER — Encounter: Payer: Self-pay | Admitting: Family Medicine

## 2021-06-21 ENCOUNTER — Other Ambulatory Visit: Payer: Self-pay

## 2021-06-21 ENCOUNTER — Inpatient Hospital Stay: Payer: 59

## 2021-06-21 ENCOUNTER — Inpatient Hospital Stay: Payer: 59 | Attending: Oncology | Admitting: Oncology

## 2021-06-21 VITALS — BP 148/100 | HR 76 | Temp 98.0°F | Resp 18 | Ht 70.0 in | Wt 181.0 lb

## 2021-06-21 DIAGNOSIS — I10 Essential (primary) hypertension: Secondary | ICD-10-CM | POA: Insufficient documentation

## 2021-06-21 DIAGNOSIS — Z85038 Personal history of other malignant neoplasm of large intestine: Secondary | ICD-10-CM | POA: Diagnosis present

## 2021-06-21 DIAGNOSIS — C187 Malignant neoplasm of sigmoid colon: Secondary | ICD-10-CM

## 2021-06-21 DIAGNOSIS — Z79899 Other long term (current) drug therapy: Secondary | ICD-10-CM | POA: Insufficient documentation

## 2021-06-21 LAB — CEA (ACCESS): CEA (CHCC): 4.63 ng/mL (ref 0.00–5.00)

## 2021-06-21 NOTE — Progress Notes (Signed)
[facility] OFFICE PROGRESS NOTE   Diagnosis: Colon cancer  INTERVAL HISTORY:   Mr. Stonehocker returns for scheduled visit.  He feels well.  Good appetite.  No difficulty with bowel function.  He continues to skip meals while at work.  No rectal bleeding. He underwent a colonoscopy by Dr. Therisa Doyne on 02/08/2021.  A polyp was removed from the transverse colon.  The pathology revealed a tubular adenoma. He is out of his blood pressure medication. Objective:  Vital signs in last 24 hours:  Blood pressure (!) 148/100, pulse 76, temperature 98 F (36.7 C), temperature source Oral, resp. rate 18, height _0  (1.778 m), weight 181 lb (82.1 kg), SpO2 100 %.    Lymphatics: "Shotty "left greater than right axillary nodes.  No cervical, supraclavicular, or inguinal nodes Resp: Lungs clear bilaterally Cardio: Regular rate and rhythm GI: No hepatosplenomegaly, no mass Vascular: No leg edema   Lab Results:  Lab Results  Component Value Date   WBC 4.5 09/22/2019   HGB 14.8 09/22/2019   HCT 41.7 09/22/2019   MCV 88.7 09/22/2019   PLT 169.0 09/22/2019   NEUTROABS 2,116 12/29/2018    CMP  Lab Results  Component Value Date   NA 139 01/26/2021   K 4.1 01/26/2021   CL 104 01/26/2021   CO2 27 01/26/2021   GLUCOSE 74 01/26/2021   BUN 14 01/26/2021   CREATININE 0.99 01/26/2021   CALCIUM 9.6 01/26/2021   PROT 7.6 09/22/2019   ALBUMIN 4.6 09/22/2019   AST 17 09/22/2019   ALT 16 09/22/2019   ALKPHOS 57 09/22/2019   BILITOT 0.6 09/22/2019   GFRNONAA >60 04/09/2018   GFRAA >60 04/09/2018    Lab Results  Component Value Date   CEA1 4.91 12/19/2020   CEA 4.63 06/21/2021    Medications: I have reviewed the patient's current medications.   Assessment/Plan:   1. 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  History of iron deficiency anemia secondary to GI blood loss and tumor bleeding  3.  Left leg pain December 2019-negative Doppler, resolved 4.  Hypertension    Disposition: Mr. Kottke is in clinical remission from colon cancer.  He will return for an office visit and CEA in 6 months.  He will continue colonoscopy surveillance with Dr. Therisa Doyne. He will contact Dr. Ethelene Hal to refill his blood pressure medication today. Betsy Coder, MD  06/21/2021  10:35 AM

## 2021-08-06 ENCOUNTER — Other Ambulatory Visit: Payer: Self-pay

## 2021-08-07 ENCOUNTER — Ambulatory Visit (INDEPENDENT_AMBULATORY_CARE_PROVIDER_SITE_OTHER): Payer: 59 | Admitting: Family Medicine

## 2021-08-07 ENCOUNTER — Encounter: Payer: Self-pay | Admitting: Family Medicine

## 2021-08-07 VITALS — BP 134/82 | HR 90 | Temp 97.7°F | Ht 70.0 in | Wt 181.4 lb

## 2021-08-07 DIAGNOSIS — I10 Essential (primary) hypertension: Secondary | ICD-10-CM | POA: Diagnosis not present

## 2021-08-07 DIAGNOSIS — Z85038 Personal history of other malignant neoplasm of large intestine: Secondary | ICD-10-CM | POA: Insufficient documentation

## 2021-08-07 DIAGNOSIS — Z9884 Bariatric surgery status: Secondary | ICD-10-CM | POA: Insufficient documentation

## 2021-08-07 DIAGNOSIS — Z0000 Encounter for general adult medical examination without abnormal findings: Secondary | ICD-10-CM

## 2021-08-07 DIAGNOSIS — H01009 Unspecified blepharitis unspecified eye, unspecified eyelid: Secondary | ICD-10-CM | POA: Insufficient documentation

## 2021-08-07 DIAGNOSIS — E559 Vitamin D deficiency, unspecified: Secondary | ICD-10-CM | POA: Diagnosis not present

## 2021-08-07 DIAGNOSIS — D500 Iron deficiency anemia secondary to blood loss (chronic): Secondary | ICD-10-CM | POA: Insufficient documentation

## 2021-08-07 LAB — URINALYSIS, ROUTINE W REFLEX MICROSCOPIC
Leukocytes,Ua: NEGATIVE
Nitrite: NEGATIVE
Specific Gravity, Urine: >=1.03 — AB (ref 1.000–1.030)
Total Protein, Urine: 30 — AB
Urine Glucose: NEGATIVE
Urobilinogen, UA: 1 (ref 0.0–1.0)
pH: 5.5 (ref 5.0–8.0)

## 2021-08-07 LAB — COMPREHENSIVE METABOLIC PANEL
ALT: 39 U/L (ref 0–53)
AST: 35 U/L (ref 0–37)
Albumin: 4.7 g/dL (ref 3.5–5.2)
Alkaline Phosphatase: 107 U/L (ref 39–117)
BUN: 16 mg/dL (ref 6–23)
CO2: 27 mEq/L (ref 19–32)
Calcium: 9.7 mg/dL (ref 8.4–10.5)
Chloride: 104 mEq/L (ref 96–112)
Creatinine, Ser: 1.05 mg/dL (ref 0.40–1.50)
GFR: 87.05 mL/min (ref >60.00)
Glucose, Bld: 109 mg/dL — ABNORMAL HIGH (ref 70–99)
Potassium: 3.9 mEq/L (ref 3.5–5.1)
Sodium: 138 mEq/L (ref 135–145)
Total Bilirubin: 0.7 mg/dL (ref 0.2–1.2)
Total Protein: 7.5 g/dL (ref 6.0–8.3)

## 2021-08-07 LAB — CBC WITH DIFFERENTIAL/PLATELET
Basophils Absolute: 0 10*3/uL (ref 0.0–0.1)
Basophils Relative: 0.5 % (ref 0.0–3.0)
Eosinophils Absolute: 0.1 10*3/uL (ref 0.0–0.7)
Eosinophils Relative: 3.9 % (ref 0.0–5.0)
HCT: 41.7 % (ref 39.0–52.0)
Hemoglobin: 14.3 g/dL (ref 13.0–17.0)
Lymphocytes Relative: 27.5 % (ref 12.0–46.0)
Lymphs Abs: 1 10*3/uL (ref 0.7–4.0)
MCHC: 34.4 g/dL (ref 30.0–36.0)
MCV: 90 fl (ref 78.0–100.0)
Monocytes Absolute: 0.4 10*3/uL (ref 0.1–1.0)
Monocytes Relative: 10.7 % (ref 3.0–12.0)
Neutro Abs: 2.1 10*3/uL (ref 1.4–7.7)
Neutrophils Relative %: 57.4 % (ref 43.0–77.0)
Platelets: 167 10*3/uL (ref 150.0–400.0)
RBC: 4.63 Mil/uL (ref 4.22–5.81)
RDW: 12.7 % (ref 11.5–15.5)
WBC: 3.6 10*3/uL — ABNORMAL LOW (ref 4.0–10.5)

## 2021-08-07 LAB — VITAMIN D 25 HYDROXY (VIT D DEFICIENCY, FRACTURES): VITD: 26.26 ng/mL — ABNORMAL LOW (ref 30.00–100.00)

## 2021-08-07 LAB — LIPID PANEL
Cholesterol: 201 mg/dL — ABNORMAL HIGH (ref 0–200)
HDL: 55 mg/dL (ref >39.00)
LDL Cholesterol: 131 mg/dL — ABNORMAL HIGH (ref 0–99)
NonHDL: 146.19
Total CHOL/HDL Ratio: 4
Triglycerides: 77 mg/dL (ref 0.0–149.0)
VLDL: 15.4 mg/dL (ref 0.0–40.0)

## 2021-08-07 LAB — HEMOGLOBIN A1C: Hgb A1c MFr Bld: 5.4 % (ref 4.6–6.5)

## 2021-08-07 MED ORDER — OLMESARTAN MEDOXOMIL 20 MG PO TABS: 20.0000 mg | ORAL_TABLET | Freq: Every day | ORAL | 2 refills | Status: DC

## 2021-08-07 NOTE — Progress Notes (Signed)
Established Patient Office Visit  Subjective:  Patient ID: Charles Rowland, male    DOB: [birth year]  Age: 46 y.o. MRN: [id number]  CC:  Chief Complaint  Patient presents with   Annual Exam    CPE, no concerns. Patient fasting.     HPI Charles Rowland presents for yearly physical, follow-up of hypertension and vitamin D.  Good news from his oncologist recently regarding his adenocarcinoma of the sigmoid colon.  No further rectal bleeding or change in bowel habits.  Blood pressure controlled with olmesartan.  Continues low-dose vitamin D.  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  Past Medical History:  Diagnosis Date   Cancer [facility])    Colon Dx 2019   Family history of breast cancer     Past Surgical History:  Procedure Laterality Date   BIOPSY  04/06/2018   Procedure: BIOPSY;  Surgeon: Ronnette Juniper, MD;  Location: [facility];  Service: Gastroenterology;;   COLONOSCOPY WITH PROPOFOL N/A 04/06/2018   Procedure: COLONOSCOPY WITH PROPOFOL;  Surgeon: Ronnette Juniper, MD;  Location: [facility];  Service: Gastroenterology;  Laterality: N/A;   LAPAROSCOPIC SMALL BOWEL RESECTION N/A 04/08/2018   Procedure: LAPAROSCOPIC ASSISTED LOW ANTERIOR RESECTION;  Surgeon: Jovita Kussmaul, MD;  Location: [facility];  Service: General;  Laterality: N/A;   POLYPECTOMY  04/06/2018   Procedure: POLYPECTOMY;  Surgeon: Ronnette Juniper, MD;  Location: [facility];  Service: Gastroenterology;;   SUBMUCOSAL INJECTION  04/06/2018   Procedure: SUBMUCOSAL INJECTION;  Surgeon: Ronnette Juniper, MD;  Location: [facility];  Service: Gastroenterology;;    Family History  Problem Relation Age of Onset   Hypertension Mother    Hypertension Father    Kidney disease Maternal Aunt    Breast cancer Maternal Grandmother        d. 32   Cancer Maternal Grandfather        lung, smoker   Lung cancer Other    CAD Neg Hx    Stroke Neg Hx     Social History   Socioeconomic History   Marital status: Married    Spouse name: Not on file    Number of children: Not on file   Years of education: Not on file   Highest education level: Not on file  Occupational History   Not on file  Tobacco Use   Smoking status: Never   Smokeless tobacco: Never  Vaping Use   Vaping Use: Never used  Substance and Sexual Activity   Alcohol use: Yes    Comment: occasional   Drug use: Never   Sexual activity: Yes  Other Topics Concern   Not on file  Social History Narrative   Not on file   Social Determinants of Health   Financial Resource Strain: Not on file  Food Insecurity: Not on file  Transportation Needs: Not on file  Physical Activity: Not on file  Stress: Not on file  Social Connections: Not on file  Intimate Partner Violence: Not on file    Outpatient Medications Prior to Visit  Medication Sig Dispense Refill   Cholecalciferol (VITAMIN D3) 50 MCG (2000 UT) TABS Take by mouth.     olmesartan (BENICAR) 20 MG tablet Take 1 tablet (20 mg total) by mouth daily. 90 tablet 2   methylcellulose oral powder Take by mouth daily.     No facility-administered medications prior to visit.    No Known Allergies  ROS Review of Systems  Constitutional:  Negative for chills, diaphoresis, fatigue, fever and unexpected weight change.  HENT:  Negative.    Eyes:  Negative for photophobia and visual disturbance.  Respiratory: Negative.    Cardiovascular: Negative.   Gastrointestinal: Negative.  Negative for abdominal pain, anal bleeding, blood in stool and constipation.  Endocrine: Negative for polyphagia and polyuria.  Genitourinary: Negative.   Musculoskeletal:  Negative for gait problem and joint swelling.  Skin:  Negative for pallor and rash.  Neurological:  Negative for speech difficulty, weakness and numbness.  Psychiatric/Behavioral: Negative.       Objective:    Physical Exam Vitals and nursing note reviewed.  Constitutional:      General: He is not in acute distress.    Appearance: Normal appearance. He is not  ill-appearing, toxic-appearing or diaphoretic.  HENT:     Head: Normocephalic and atraumatic.     Right Ear: Tympanic membrane, ear canal and external ear normal.     Left Ear: Tympanic membrane, ear canal and external ear normal.     Mouth/Throat:     Mouth: Mucous membranes are moist.     Pharynx: Oropharynx is clear. No oropharyngeal exudate or posterior oropharyngeal erythema.  Eyes:     Extraocular Movements: Extraocular movements intact.     Conjunctiva/sclera: Conjunctivae normal.     Pupils: Pupils are equal, round, and reactive to light.  Neck:     Vascular: No carotid bruit.  Cardiovascular:     Rate and Rhythm: Normal rate and regular rhythm.  Pulmonary:     Effort: Pulmonary effort is normal.     Breath sounds: Normal breath sounds.  Abdominal:     General: Bowel sounds are normal. There is no distension.     Palpations: There is no mass.     Tenderness: There is no abdominal tenderness. There is no guarding or rebound.     Hernia: No hernia is present. There is no hernia in the left inguinal area or right inguinal area.  Genitourinary:    Penis: Circumcised. No hypospadias, erythema, tenderness, discharge, swelling or lesions.      Testes:        Right: Mass, tenderness or swelling not present. Right testis is descended.        Left: Mass, tenderness or swelling not present. Left testis is descended.     Epididymis:     Right: Not inflamed or enlarged.     Left: Not inflamed or enlarged.  Musculoskeletal:     Cervical back: No rigidity or tenderness.  Lymphadenopathy:     Cervical: No cervical adenopathy.     Lower Body: No right inguinal adenopathy. No left inguinal adenopathy.  Skin:    General: Skin is warm and dry.  Neurological:     Mental Status: He is alert and oriented to person, place, and time.  Psychiatric:        Mood and Affect: Mood normal.        Behavior: Behavior normal.    BP 134/82 (BP Location: Right Arm, Patient Position: Sitting, Cuff  Size: Normal)    Pulse 90    Temp 97.7 F (36.5 C) (Temporal)    Ht '5\' 10"'$  (1.778 m)    Wt 181 lb 6.4 oz (82.3 kg)    SpO2 98%    BMI 26.03 kg/m  Wt Readings from Last 3 Encounters:  08/07/21 181 lb 6.4 oz (82.3 kg)  06/21/21 181 lb (82.1 kg)  01/26/21 183 lb 9.6 oz (83.3 kg)     Health Maintenance Due  Topic Date Due   Hepatitis C Screening  Never done    There are no preventive care reminders to display for this patient.  Lab Results  Component Value Date   TSH 3.88 12/29/2018   Lab Results  Component Value Date   WBC 4.5 09/22/2019   HGB 14.8 09/22/2019   HCT 41.7 09/22/2019   MCV 88.7 09/22/2019   PLT 169.0 09/22/2019   Lab Results  Component Value Date   NA 139 01/26/2021   K 4.1 01/26/2021   CO2 27 01/26/2021   GLUCOSE 74 01/26/2021   BUN 14 01/26/2021   CREATININE 0.99 01/26/2021   BILITOT 0.6 09/22/2019   ALKPHOS 57 09/22/2019   AST 17 09/22/2019   ALT 16 09/22/2019   PROT 7.6 09/22/2019   ALBUMIN 4.6 09/22/2019   CALCIUM 9.6 01/26/2021   ANIONGAP 9 04/09/2018   GFR 93.77 01/26/2021   Lab Results  Component Value Date   CHOL 193 09/22/2019   Lab Results  Component Value Date   HDL 28.60 (L) 09/22/2019   Lab Results  Component Value Date   LDLCALC 129 (H) 09/22/2019   Lab Results  Component Value Date   TRIG 177.0 (H) 09/22/2019   Lab Results  Component Value Date   CHOLHDL 7 09/22/2019   Lab Results  Component Value Date   HGBA1C 4.9 04/07/2018      Assessment & Plan:   Problem List Items Addressed This Visit       Cardiovascular and Mediastinum   Essential hypertension - Primary   Relevant Medications   olmesartan (BENICAR) 20 MG tablet   Other Relevant Orders   CBC with Differential/Platelet   Comprehensive metabolic panel   Urinalysis   Other Visit Diagnoses     Healthcare maintenance       Relevant Orders   Hemoglobin A1c   Lipid panel   Vitamin D deficiency       Relevant Orders   VITAMIN D 25 Hydroxy (Vit-D  Deficiency, Fractures)       Meds ordered this encounter  Medications   olmesartan (BENICAR) 20 MG tablet    Sig: Take 1 tablet (20 mg total) by mouth daily.    Dispense:  90 tablet    Refill:  2    Follow-up: Return in about 1 year (around 08/08/2022), or if symptoms worsen or fail to improve.  Information given on management of hypertension.  Suggested that he check his blood pressures periodically.  Information given on health maintenance and disease prevention.  Libby Maw, MD

## 2021-11-16 ENCOUNTER — Other Ambulatory Visit: Payer: Self-pay

## 2021-11-16 ENCOUNTER — Emergency Department (HOSPITAL_COMMUNITY): Payer: 59

## 2021-11-16 ENCOUNTER — Emergency Department (HOSPITAL_COMMUNITY)
Admission: EM | Admit: 2021-11-16 | Discharge: 2021-11-17 | Disposition: A | Discharge status: Home / Self Care | Payer: 59 | Attending: Emergency Medicine | Admitting: Emergency Medicine

## 2021-11-16 ENCOUNTER — Encounter (HOSPITAL_COMMUNITY): Payer: Self-pay

## 2021-11-16 DIAGNOSIS — R109 Unspecified abdominal pain: Secondary | ICD-10-CM | POA: Diagnosis present

## 2021-11-16 DIAGNOSIS — Z85038 Personal history of other malignant neoplasm of large intestine: Secondary | ICD-10-CM | POA: Diagnosis not present

## 2021-11-16 DIAGNOSIS — R160 Hepatomegaly, not elsewhere classified: Secondary | ICD-10-CM

## 2021-11-16 DIAGNOSIS — Z85040 Personal history of malignant carcinoid tumor of rectum: Secondary | ICD-10-CM | POA: Diagnosis not present

## 2021-11-16 DIAGNOSIS — R7401 Elevation of levels of liver transaminase levels: Secondary | ICD-10-CM | POA: Diagnosis not present

## 2021-11-16 HISTORY — DX: Essential (primary) hypertension: I10

## 2021-11-16 LAB — CBC
HCT: 42.5 % (ref 39.0–52.0)
Hemoglobin: 14.7 g/dL (ref 13.0–17.0)
MCH: 31.6 pg (ref 26.0–34.0)
MCHC: 34.6 g/dL (ref 30.0–36.0)
MCV: 91.4 fL (ref 80.0–100.0)
Platelets: 147 10*3/uL — ABNORMAL LOW (ref 150–400)
RBC: 4.65 MIL/uL (ref 4.22–5.81)
RDW: 11.9 % (ref 11.5–15.5)
WBC: 8.3 10*3/uL (ref 4.0–10.5)
nRBC: 0 % (ref 0.0–0.2)

## 2021-11-16 LAB — COMPREHENSIVE METABOLIC PANEL
ALT: 71 U/L — ABNORMAL HIGH (ref 0–44)
AST: 111 U/L — ABNORMAL HIGH (ref 15–41)
Albumin: 4.8 g/dL (ref 3.5–5.0)
Alkaline Phosphatase: 108 U/L (ref 38–126)
Anion gap: 7 (ref 5–15)
BUN: 17 mg/dL (ref 6–20)
CO2: 27 mmol/L (ref 22–32)
Calcium: 9.5 mg/dL (ref 8.9–10.3)
Chloride: 105 mmol/L (ref 98–111)
Creatinine, Ser: 1.12 mg/dL (ref 0.61–1.24)
GFR, Estimated: >60 mL/min (ref >60)
Glucose, Bld: 103 mg/dL — ABNORMAL HIGH (ref 70–99)
Potassium: 3.7 mmol/L (ref 3.5–5.1)
Sodium: 139 mmol/L (ref 135–145)
Total Bilirubin: 1.4 mg/dL — ABNORMAL HIGH (ref 0.3–1.2)
Total Protein: 8.1 g/dL (ref 6.5–8.1)

## 2021-11-16 LAB — URINALYSIS, ROUTINE W REFLEX MICROSCOPIC
Bilirubin Urine: NEGATIVE
Glucose, UA: NEGATIVE mg/dL
Hgb urine dipstick: NEGATIVE
Ketones, ur: NEGATIVE mg/dL
Leukocytes,Ua: NEGATIVE
Nitrite: NEGATIVE
Protein, ur: NEGATIVE mg/dL
Specific Gravity, Urine: 1.009 (ref 1.005–1.030)
pH: 7 (ref 5.0–8.0)

## 2021-11-16 LAB — LIPASE, BLOOD: Lipase: 36 U/L (ref 11–51)

## 2021-11-16 MED ORDER — IOHEXOL 300 MG/ML  SOLN
100.0000 mL | Freq: Once | INTRAMUSCULAR | Status: AC | PRN
  Administered 2021-11-16: 100 mL via INTRAVENOUS

## 2021-11-16 MED ORDER — OXYCODONE-ACETAMINOPHEN 5-325 MG PO TABS
1.0000 | ORAL_TABLET | Freq: Once | ORAL | Status: AC
  Administered 2021-11-16: 1 via ORAL
  Filled 2021-11-16: qty 1

## 2021-11-16 NOTE — ED Notes (Signed)
Pt says pain started around noon. Pt went to UC around 3:45 and they advised to come to ER. Pt states the Pain meds given while in triage has helped and pain has decreased but discomfort is still there. Pt doesn't feel its bad enough for additional pain meds at this time. I advised if it gets worse to let me know. Pain in right lower quad. No radiation. Tender to touch. Eailer gives 9 out of 10 current 3 out of 10 after oral pain meds. 18GA iv established in left [facility]

## 2021-11-16 NOTE — ED Provider Triage Note (Signed)
Emergency Medicine Provider Triage Evaluation Note  Charles Rowland , a 46 y.o. male  was evaluated in triage.  Pt complains of abdominal pain.  He states that same came on suddenly around noon today and has been worsening since.  States that it is located throughout his abdomen but mostly to his right upper quadrant.  Denies any nausea, vomiting, or diarrhea.  Says that he had a normal bowel movement and was able to eat breakfast this morning without pain.  History of colon cancer with resection in 2018, no other history of abdominal surgeries.  Review of Systems  Positive:  Negative:   Physical Exam  BP (!) 160/95 (BP Location: Right Arm)   Pulse 71   Temp 98.5 F (36.9 C) (Oral)   Resp 18   Ht '5\' 10"'$  (1.778 m)   Wt 81.6 kg   SpO2 100%   BMI 25.83 kg/m  Gen:   Awake, no distress   Resp:  Normal effort  MSK:   Moves extremities without difficulty  Other:  RUQ tenderness  Medical Decision Making  Medically screening exam initiated at 5:31 PM.  Appropriate orders placed.  Charles Rowland was informed that the remainder of the evaluation will be completed by another provider, this initial triage assessment does not replace that evaluation, and the importance of remaining in the ED until their evaluation is complete.     Bud Face, PA-C 11/16/21 1732

## 2021-11-16 NOTE — ED Triage Notes (Signed)
Patient c/o mid abdominal pain that started at 1200 today and worsening. Patient went to UC today and was sent to the ED for further evaluation/CT scan.  Patient denies N/v/d. Patient has a history of colorectal cancer.

## 2021-11-16 NOTE — ED Provider Notes (Signed)
Charles Rowland DEPT Provider Note   CSN: [id number] Arrival date & time: 11/16/21  1649     History {Add pertinent medical, surgical, social history, OB history to HPI:1} Chief Complaint  Patient presents with  . Abdominal Pain    Charles Rowland is a 46 y.o. male who presents emergency department with a chief complaint of abdominal pain.  Patient was in his normal state of health until about 3 PM this afternoon.  He had onset of generalized abdominal pain, constant, waxing and waning in nature without nausea, vomiting, diarrhea or constipation.  Patient was seen at an urgent care earlier today, had abdominal tenderness in the right lower quadrant and was instructed to come to the emergency department for further evaluation.  He has a past medical history of colorectal cancer with resection.  Patient denies flank pain, urinary symptoms, alcohol or Tylenol abuse.   Abdominal Pain      Home Medications Prior to Admission medications   Medication Sig Start Date End Date Taking? Authorizing Provider  Cholecalciferol (VITAMIN D3) 50 MCG (2000 UT) TABS Take by mouth.    [provider]  olmesartan (BENICAR) 20 MG tablet Take 1 tablet (20 mg total) by mouth daily. 08/07/21   Libby Maw, MD      Allergies    Patient has no known allergies.    Review of Systems   Review of Systems  Gastrointestinal:  Positive for abdominal pain.    Physical Exam Updated Vital Signs BP (!) 149/104 (BP Location: Right Arm)   Pulse 60   Temp (!) 97.5 F (36.4 C) (Oral)   Resp 16   Ht '5\' 10"'$  (1.778 m)   Wt 81.6 kg   SpO2 100%   BMI 25.83 kg/m  Physical Exam Vitals and nursing note reviewed.  Constitutional:      General: He is not in acute distress.    Appearance: He is well-developed. He is not diaphoretic.  HENT:     Head: Normocephalic and atraumatic.  Eyes:     General: No scleral icterus.    Conjunctiva/sclera: Conjunctivae normal.   Cardiovascular:     Rate and Rhythm: Normal rate and regular rhythm.     Heart sounds: Normal heart sounds.  Pulmonary:     Effort: Pulmonary effort is normal. No respiratory distress.     Breath sounds: Normal breath sounds.  Abdominal:     Palpations: Abdomen is soft.     Tenderness: There is abdominal tenderness in the epigastric area and left upper quadrant. There is guarding.  Musculoskeletal:     Cervical back: Normal range of motion and neck supple.  Skin:    General: Skin is warm and dry.  Neurological:     Mental Status: He is alert.  Psychiatric:        Behavior: Behavior normal.     ED Results / Procedures / Treatments   Labs (all labs ordered are listed, but only abnormal results are displayed) Labs Reviewed  COMPREHENSIVE METABOLIC PANEL - Abnormal; Notable for the following components:      Result Value   Glucose, Bld 103 (*)    AST 111 (*)    ALT 71 (*)    Total Bilirubin 1.4 (*)    All other components within normal limits  CBC - Abnormal; Notable for the following components:   Platelets 147 (*)    All other components within normal limits  LIPASE, BLOOD  URINALYSIS, ROUTINE W REFLEX MICROSCOPIC  EKG None  Radiology US Abdomen Limited RUQ (LIVER/GB)  Result Date: 11/16/2021 CLINICAL DATA:  Right upper quadrant pain EXAM: ULTRASOUND ABDOMEN LIMITED RIGHT UPPER QUADRANT COMPARISON:  None Available. FINDINGS: Gallbladder: Gallbladder is distended. No gallstones or wall thickening visualized. No sonographic Murphy sign noted by sonographer. Common bile duct: Diameter: 7 mm Liver: Mass of the left lobe of the liver measuring 3.7 x 2.0 x 4.7 cm with hypoechoic halo. Within normal limits in parenchymal echogenicity. Portal vein is patent on color Doppler imaging with normal direction of blood flow towards the liver. Other: None. IMPRESSION: 1. Mass of the left lobe of the liver, measuring up to 4.7 cm. Recommend contrast-enhanced liver MRI for further  evaluation. 2. Distended gallbladder. No findings to suggest acute cholecystitis. 3. Mildly dilated common bile duct, finding can also be further evaluated with MRI/MRCP. Electronically Signed   By: Yetta Glassman M.D.   On: 11/16/2021 18:33    Procedures Procedures  {Document cardiac monitor, telemetry assessment procedure when appropriate:1}  Medications Ordered in ED Medications  oxyCODONE-acetaminophen (PERCOCET/ROXICET) 5-325 MG per tablet 1 tablet (1 tablet Oral Given 11/16/21 1731)    ED Course/ Medical Decision Making/ A&P Clinical Course as of 11/16/21 2242  Fri Nov 16, 2021  2241 Comprehensive metabolic panel(!) [AH]  8250 Urinalysis, Routine w reflex microscopic [AH]  2241 CBC(!) [AH]  2241 AST(!): 111 [AH]  2241 ALT(!): 71 [AH]  2241 Total Bilirubin(!): 35.47 46 year old male here with abdominal pain.The differential diagnosis for generalized abdominal pain includes, but is not limited to AAA, gastroenteritis, appendicitis, Bowel obstruction, Bowel perforation. Gastroparesis, DKA, Hernia, Inflammatory bowel disease, mesenteric ischemia, pancreatitis, peritonitis SBP, volvulus.  I reviewed the patient's labs which shows AST ALT and total bilirubin and elevation.  I have reviewed the patient's ultrasound which shows dilated common bile duct and a mass of the liver.  I discussed findings with the patient.  Patient will undergo CT chest abdomen pelvis with concern for potential recurrence and metastasis of his colorectal cancer. [AH]    Clinical Course User Index [AH] Margarita Mail, PA-C                           Medical Decision Making Amount and/or Complexity of Data Reviewed Labs: ordered.   ***  {Document critical care time when appropriate:1} {Document review of labs and clinical decision tools ie heart score, Chads2Vasc2 etc:1}  {Document your independent review of radiology images, and any outside records:1} {Document your discussion with family members,  caretakers, and with consultants:1} {Document social determinants of health affecting pt's care:1} {Document your decision making why or why not admission, treatments were needed:1} Final Clinical Impression(s) / ED Diagnoses Final diagnoses:  None    Rx / DC Orders ED Discharge Orders     None

## 2021-11-17 MED ORDER — ONDANSETRON HCL 4 MG PO TABS: 4.0000 mg | ORAL_TABLET | Freq: Three times a day (TID) | ORAL | 0 refills | Status: DC | PRN

## 2021-11-17 MED ORDER — SODIUM CHLORIDE 0.9 % IV BOLUS (SEPSIS)
500.0000 mL | Freq: Once | INTRAVENOUS | Status: AC
  Administered 2021-11-17: 500 mL via INTRAVENOUS

## 2021-11-17 MED ORDER — OXYCODONE HCL 5 MG PO TABS: 2.5000 mg | ORAL_TABLET | ORAL | 0 refills | Status: DC | PRN

## 2021-11-17 MED ORDER — LORAZEPAM 2 MG/ML IJ SOLN
1.0000 mg | Freq: Once | INTRAMUSCULAR | Status: AC
  Administered 2021-11-17: 1 mg via INTRAVENOUS
  Filled 2021-11-17: qty 1

## 2021-11-17 NOTE — Discharge Instructions (Signed)
Contact a health care provider if: Your pain lasts more than 5 hours. You vomit. You have a fever and chills. Your pain gets worse. Get help right away if: Your skin or the whites of your eyes look yellow (jaundice). Your have tea-colored urine and light-colored stools (feces). You are dizzy or you faint. 

## 2021-11-19 ENCOUNTER — Inpatient Hospital Stay: Payer: 59 | Attending: Oncology | Admitting: Oncology

## 2021-11-19 ENCOUNTER — Telehealth: Payer: Self-pay | Admitting: *Deleted

## 2021-11-19 VITALS — BP 146/107 | HR 81 | Temp 98.2°F | Resp 18 | Ht 70.0 in | Wt 187.4 lb

## 2021-11-19 DIAGNOSIS — I10 Essential (primary) hypertension: Secondary | ICD-10-CM | POA: Insufficient documentation

## 2021-11-19 DIAGNOSIS — Z85038 Personal history of other malignant neoplasm of large intestine: Secondary | ICD-10-CM | POA: Insufficient documentation

## 2021-11-19 DIAGNOSIS — Z79899 Other long term (current) drug therapy: Secondary | ICD-10-CM | POA: Insufficient documentation

## 2021-11-19 DIAGNOSIS — C187 Malignant neoplasm of sigmoid colon: Secondary | ICD-10-CM

## 2021-11-19 MED ORDER — OXYCODONE HCL 5 MG PO TABS: 2.5000 mg | ORAL_TABLET | ORAL | 0 refills | Status: DC | PRN

## 2021-11-19 MED ORDER — ONDANSETRON HCL 4 MG PO TABS
4.0000 mg | ORAL_TABLET | Freq: Three times a day (TID) | ORAL | 2 refills | Status: DC | PRN
Start: 2021-11-19 — End: 2022-08-12

## 2021-11-19 NOTE — Progress Notes (Signed)
[place] OFFICE PROGRESS NOTE   Diagnosis: Colon cancer  INTERVAL HISTORY:   Charles Rowland returns for scheduled visit.  He reports he acute onset of right upper abdominal pain 11/16/2021.  The pain progressed throughout the day and he presented to the emergency room.  A right upper quadrant ultrasound revealed a 4.7 cm left liver mass, a distended gallbladder, and a mildly dilated common bile duct.  No evidence of acute cholecystitis. CTs of the chest, abdomen, and pelvis revealed a poorly defined 5 cm left hepatic mass.  The gallbladder appeared unremarkable.  Left intrahepatic biliary ductal dilatation.  He was treated with Percocet in the emergency room.  He has been taking 2.5 mg of oxycodone intermittently.  The pain is much improved.  He felt completely well prior to the onset of pain 11/16/2021.  Good appetite and energy level.  No difficulty with bowel function.  No bleeding. Objective:  Vital signs in last 24 hours:  Blood pressure (!) 146/107, pulse 81, temperature 98.2 F (36.8 C), temperature source Oral, resp. rate 18, height '5\' 10"'  (1.778 m), weight 187 lb 6.4 oz (85 kg), SpO2 100 %.     Lymphatics: No cervical, supraclavicular, axillary, or inguinal nodes Resp: Lungs clear bilaterally Cardio: Regular rate and rhythm GI: Nontender, no mass, no hepatosplenomegaly Vascular: No leg edema    Lab Results:  Lab Results  Component Value Date   WBC 8.3 11/16/2021   HGB 14.7 11/16/2021   HCT 42.5 11/16/2021   MCV 91.4 11/16/2021   PLT 147 (L) 11/16/2021   NEUTROABS 2.1 08/07/2021    CMP  Lab Results  Component Value Date   NA 139 11/16/2021   K 3.7 11/16/2021   CL 105 11/16/2021   CO2 27 11/16/2021   GLUCOSE 103 (H) 11/16/2021   BUN 17 11/16/2021   CREATININE 1.12 11/16/2021   CALCIUM 9.5 11/16/2021   PROT 8.1 11/16/2021   ALBUMIN 4.8 11/16/2021   AST 111 (H) 11/16/2021   ALT 71 (H) 11/16/2021   ALKPHOS 108 11/16/2021   BILITOT 1.4 (H)  11/16/2021   GFRNONAA >60 11/16/2021   GFRAA >60 04/09/2018    Lab Results  Component Value Date   CEA1 4.91 12/19/2020   CEA 4.63 06/21/2021    No results found for: "INR", "LABPROT"  Imaging:  CT CHEST ABDOMEN PELVIS W CONTRAST  Result Date: 11/16/2021 CLINICAL DATA:  Right upper quadrant pain. Polytrauma, blunt Lymphadenopathy, chest or axilla. History of colon cancer. EXAM: CT CHEST, ABDOMEN, AND PELVIS WITH CONTRAST TECHNIQUE: Multidetector CT imaging of the chest, abdomen and pelvis was performed following the standard protocol during bolus administration of intravenous contrast. RADIATION DOSE REDUCTION: This exam was performed according to the departmental dose-optimization program which includes automated exposure control, adjustment of the mA and/or kV according to patient size and/or use of iterative reconstruction technique. CONTRAST:  132m OMNIPAQUE IOHEXOL 300 MG/ML  SOLN COMPARISON:  CT chest, abdomen, pelvis 04/06/2018, ultrasound abdomen 11/16/2021 FINDINGS: CHEST: Ports and Devices: None. Lungs/airways: Incidentally noted azygous fissure. No focal consolidation. No pulmonary nodule. No pulmonary mass. No pulmonary contusion or laceration. No pneumatocele formation. The central airways are patent. Pleura: No pleural effusion. No pneumothorax. No hemothorax. Lymph Nodes: No mediastinal, hilar, or axillary lymphadenopathy. Mediastinum: No pneumomediastinum. No aortic injury or mediastinal hematoma. The thoracic aorta is normal in caliber. The heart is normal in size. No significant pericardial effusion. The esophagus is unremarkable. The thyroid is unremarkable. Chest Wall / Breasts: No chest wall  mass. Musculoskeletal: No acute rib or sternal fracture. No spinal fracture. ABDOMEN / PELVIS: Liver: Not enlarged. Redemonstration of a left hepatic lobe poorly defined, at least 5 cm, heterogeneous lesion. No laceration or subcapsular hematoma. Biliary System: The gallbladder is otherwise  unremarkable with no radio-opaque gallstones. Left intrahepatic biliary ductal dilatation. Pancreas: Normal pancreatic contour. No main pancreatic duct dilatation. Spleen: Not enlarged. No focal lesion. No laceration, subcapsular hematoma, or vascular injury. Adrenal Glands: No nodularity bilaterally. Kidneys: Bilateral kidneys enhance symmetrically. No hydronephrosis. No contusion, laceration, or subcapsular hematoma. Bilateral fluid density lesions within the kidneys likely represent simple renal cysts. Simple renal cysts, in the absence of clinically indicated signs/symptoms, require no independent follow-up. Subcentimeter hypodensity too small to characterize. No injury to the vascular structures or collecting systems. No hydroureter. The urinary bladder is unremarkable. Bowel: Surgical changes related to a sigmoid resection. No small or large bowel wall thickening or dilatation. Mobile medialized cecum. The appendix is unremarkable. Mesentery, Omentum, and Peritoneum: No simple free fluid ascites. No pneumoperitoneum. No hemoperitoneum. No mesenteric hematoma identified. No organized fluid collection. Pelvic Organs: Prostate is unremarkable. Lymph Nodes: Stable prominent but nonenlarged retroperitoneal lymph node (2:84) measuring up to 0.7 cm. No abdominal, pelvic, inguinal lymphadenopathy. Vasculature: The main portal and splenic veins are patent. The superior mesenteric vein is poorly visualized due to timing of contrast. No abdominal aorta or iliac aneurysm. No active contrast extravasation or pseudoaneurysm. Musculoskeletal: No significant soft tissue hematoma. No acute pelvic fracture. No spinal fracture. IMPRESSION: 1. A poorly defined at least 5 cm heterogeneous left hepatic lobe lesion with associated adjacent left hepatic lobe biliary ductal dilatation. Finding concerning for metastasis in the setting of prior colon cancer versus primary malignancy. When the patient is clinically stable and able to  follow directions and hold their breath (preferably as an outpatient) further evaluation with dedicated abdominal MRI liver protocol should be considered. 2. Surgical changes related to a sigmoid resection. 3. No acute traumatic injury to the chest, abdomen, or pelvis. 4. No acute fracture or traumatic malalignment of the thoracic or lumbar spine. Electronically Signed   By: Iven Finn M.D.   On: 11/16/2021 23:34   US Abdomen Limited RUQ (LIVER/GB)  Result Date: 11/16/2021 CLINICAL DATA:  Right upper quadrant pain EXAM: ULTRASOUND ABDOMEN LIMITED RIGHT UPPER QUADRANT COMPARISON:  None Available. FINDINGS: Gallbladder: Gallbladder is distended. No gallstones or wall thickening visualized. No sonographic Murphy sign noted by sonographer. Common bile duct: Diameter: 7 mm Liver: Mass of the left lobe of the liver measuring 3.7 x 2.0 x 4.7 cm with hypoechoic halo. Within normal limits in parenchymal echogenicity. Portal vein is patent on color Doppler imaging with normal direction of blood flow towards the liver. Other: None. IMPRESSION: 1. Mass of the left lobe of the liver, measuring up to 4.7 cm. Recommend contrast-enhanced liver MRI for further evaluation. 2. Distended gallbladder. No findings to suggest acute cholecystitis. 3. Mildly dilated common bile duct, finding can also be further evaluated with MRI/MRCP. Electronically Signed   By: Yetta Glassman M.D.   On: 11/16/2021 18:33    Medications: I have reviewed the patient's current medications.   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   History of  iron deficiency anemia secondary to GI blood loss and tumor bleeding  3.  Left leg pain December 2019-negative Doppler, resolved 4.  Hypertension 5.  Acute right abdominal pain 11/16/2021 6.  Mild elevation of liver enzymes/bilirubin 11/16/2021     Disposition: Charles Rowland has a history of early stage II colon cancer diagnosed in November 2019.  He presented to the emergency room 11/16/2021 with acute onset right abdominal pain.  A CT reveals an isolated left liver lesion.  It is unclear whether the pain he experienced last week is related to the CT finding.  I discussed the differential diagnosis with Charles Rowland and his wife.  He understands the liver lesion most likely reflects metastatic colon cancer.  The differential diagnosis includes cholangiocarcinoma, hepatocellular carcinoma, and metastatic disease from another tumor site.  We discussed potential treatment options including surgical resection and adjuvant chemotherapy.  I discussed the case with Dr. Fayrene Helper.  He will see Charles Rowland on 11/26/2021.  He will return for an office visit here on 11/28/2021.  I refilled his prescriptions for Zofran and oxycodone.  He will call if the severe abdominal pain reoccurs.  Betsy Coder, MD  11/19/2021  4:11 PM

## 2021-11-19 NOTE — Telephone Encounter (Addendum)
Per Dr. Benay Spice: Offer appointment with him today at 3:45 for 1 hours. Left VM w/patient and his wife's phone w/appointment and request for return call to confirm. Patient called back to confirm he will come today at 3:45

## 2021-11-20 ENCOUNTER — Telehealth: Payer: Self-pay | Admitting: Oncology

## 2021-11-20 ENCOUNTER — Other Ambulatory Visit: Payer: Self-pay | Admitting: *Deleted

## 2021-11-20 NOTE — Progress Notes (Signed)
Foundation one testing requested on Accession number [id number] and CT images pushed into [facility] for appointment with [facility] on 6/26

## 2021-11-20 NOTE — Telephone Encounter (Signed)
Attempted to contact patient to advise about follow up visit from Dr. Gearldine Shown LOS from 6/19. No answer so voicemail was left with date and time and also advised that it will be on his Mychart

## 2021-11-22 ENCOUNTER — Encounter: Payer: Self-pay | Admitting: *Deleted

## 2021-11-22 NOTE — Progress Notes (Signed)
Notified by FO that sample was received for testing on 11/22/21

## 2021-11-28 ENCOUNTER — Inpatient Hospital Stay (HOSPITAL_BASED_OUTPATIENT_CLINIC_OR_DEPARTMENT_OTHER): Payer: 59 | Admitting: Oncology

## 2021-11-28 ENCOUNTER — Encounter (HOSPITAL_COMMUNITY): Payer: Self-pay | Admitting: Oncology

## 2021-11-28 VITALS — BP 145/94 | HR 96 | Temp 98.1°F | Resp 18 | Ht 70.0 in | Wt 188.8 lb

## 2021-11-28 DIAGNOSIS — C187 Malignant neoplasm of sigmoid colon: Secondary | ICD-10-CM | POA: Diagnosis not present

## 2021-11-28 DIAGNOSIS — Z85038 Personal history of other malignant neoplasm of large intestine: Secondary | ICD-10-CM | POA: Diagnosis not present

## 2021-11-28 NOTE — Progress Notes (Addendum)
[place] OFFICE PROGRESS NOTE   Diagnosis: Colon cancer  INTERVAL HISTORY:   Charles Rowland returns as scheduled.  He is here today with his wife.  No abdominal pain.  No complaint.  He saw Dr. Fayrene Helper on 11/26/2021.  Dr. Fayrene Helper recommends resection of the left liver lesions and adjuvant chemotherapy.  Charles Rowland is undecided regarding placement of a hepatic infusion pump.  Objective:  Vital signs in last 24 hours:  Blood pressure (!) 145/94, pulse 96, temperature 98.1 F (36.7 C), temperature source Oral, resp. rate 18, height '5\' 10"'  (1.778 m), weight 188 lb 12.8 oz (85.6 kg), SpO2 100 %.   Physical examination-not performed today` Lab Results:  Lab Results  Component Value Date   WBC 8.3 11/16/2021   HGB 14.7 11/16/2021   HCT 42.5 11/16/2021   MCV 91.4 11/16/2021   PLT 147 (L) 11/16/2021   NEUTROABS 2.1 08/07/2021    CMP  Lab Results  Component Value Date   NA 139 11/16/2021   K 3.7 11/16/2021   CL 105 11/16/2021   CO2 27 11/16/2021   GLUCOSE 103 (H) 11/16/2021   BUN 17 11/16/2021   CREATININE 1.12 11/16/2021   CALCIUM 9.5 11/16/2021   PROT 8.1 11/16/2021   ALBUMIN 4.8 11/16/2021   AST 111 (H) 11/16/2021   ALT 71 (H) 11/16/2021   ALKPHOS 108 11/16/2021   BILITOT 1.4 (H) 11/16/2021   GFRNONAA >60 11/16/2021   GFRAA >60 04/09/2018    Lab Results  Component Value Date   CEA1 4.91 12/19/2020   CEA 4.63 06/21/2021     Medications: I have reviewed the patient's current medications.   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, pT3N0M0, G2, stage IIA, MSI-Stable Foundation 1-MSS, tumor mutation burden 4, ATM-subclonal, BCL2L1 amplification, [place] amplification Colonoscopy 03/26/2019-5 mm polyp removed from the ascending colon, sessile serrated adenoma Colonoscopy 02/08/2021-polyp removed from the transverse colon, tubular adenoma CTs 11/16/2021-5 cm heterogenous left hepatic lobe lesion with associated left  hepatic lobe biliary ductal dilatation, stable nonenlarged retroperitoneal lymph node MRI liver 11/25/2021-rim-enhancing centrally necrotic 4.6 x 4.5 left liver lesion with adjacent 2.8 x 2.7 cm lesion-with mass effect on the left hepatic bile duct, no lymphadenopathy, normal adrenal glands 2.  History of iron deficiency anemia secondary to GI blood loss and tumor bleeding  3.  Left leg pain December 2019-negative Doppler, resolved 4.  Hypertension 5.  Acute right abdominal pain 11/16/2021 6.  Mild elevation of liver enzymes/bilirubin 11/16/2021    Disposition: Charles Rowland has a history of sigmoid colon cancer dating to November 2019.  He was found to have a left liver lesion on a CT when he presented to the emergency room with abdominal pain.  A liver MRI confirms 2 left-sided liver lesions and no other evidence of metastatic disease. He saw Dr.Lidsky earlier this week.  He understands the liver lesions likely represent metastatic colon cancer, but another malignancy is possible.  The plan is to proceed with a hepatectomy followed by adjuvant FOLFOX.  Charles Rowland is being scheduled for staging CTs at [facility] prior to surgery.  Surgery is currently scheduled for 12/11/2021. His case was presented at the [place].  The recommendation is to proceed with hepatic resection and placement of a hepatic infusion pump.  Charles Rowland has concerns regarding the need to case for placement of the hepatic infusion pump.  He plans to make a decision on this by the end of the week.  I recommended he contact  Dr. Fayrene Helper with his questions regarding the hepatic infusion pump.  I will communicate with Dr. Fayrene Helper this week.  We reviewed the treatment schedule for adjuvant FOLFOX chemotherapy.  He understands he will complete the chemotherapy at [facility] if he undergoes placement of a hepatic infusion pump.  We discussed the potential for nausea/vomiting, mucositis, alopecia, diarrhea, and hematologic toxicity with  chance for infection and bleeding.  We reviewed the sun sensitivity, hyperpigmentation, rash, and hand/foot syndrome associated with 5-fluorouracil.  We discussed the allergic reaction various types of neuropathy seen with oxaliplatin.  He will attend a chemotherapy teaching class.  Charles Rowland will be scheduled for an office visit here approximately 2 weeks following the hepatic resection.  Betsy Coder, MD  11/28/2021  3:40 PM

## 2021-12-05 DIAGNOSIS — C189 Malignant neoplasm of colon, unspecified: Secondary | ICD-10-CM | POA: Insufficient documentation

## 2021-12-18 ENCOUNTER — Telehealth: Payer: Self-pay

## 2021-12-18 NOTE — Telephone Encounter (Signed)
Transition Care Management Follow-up Telephone Call Date of discharge and from where:   [facility] 12/12/21-12/17/21 How have you been since you were released from the hospital? Per patients spouse, his cancer has recurred and he needs to rest for 4 weeks then start Chemotherapy.  He is doing OK since surgery. Any questions or concerns? Yes- Spouse noted patient's blood press was elevated and he may need to be seen at PCP's office for visit.  She will call to set up appt when patient able to move better.  Items Reviewed: Did the pt receive and understand the discharge instructions provided? Yes  Medications obtained and verified? Yes  Other? No  Any new allergies since your discharge? No  Dietary orders reviewed? Yes Do you have support at home? Yes   Home Care and Equipment/Supplies: Were home health services ordered? no If so, what is the name of the agency? N/A  Has the agency set up a time to come to the patient's home? no Were any new equipment or medical supplies ordered?  No What is the name of the medical supply agency? N/A Were you able to get the supplies/equipment? no Do you have any questions related to the use of the equipment or supplies? No  Functional Questionnaire: (I = Independent and D = Dependent) ADLs: I  Bathing/Dressing- I-some help needed  Meal Prep- D  Eating- I  Maintaining continence- I  Transferring/Ambulation- I  Managing Meds- I  Follow up appointments reviewed:  PCP Hospital f/u appt confirmed? No  Patients spouse to schedule to address blood pressure Specialist Hospital f/u appt confirmed? Yes  Scheduled to see Dr. Fayrene Helper at [facility] on 12/24/21 @ 0830. Are transportation arrangements needed? Yes  If their condition worsens, is the pt aware to call PCP or go to the Emergency Dept.? Yes Was the patient provided with contact information for the PCP's office or ED? Yes Was to pt encouraged to call back with questions or concerns? Yes

## 2021-12-19 ENCOUNTER — Inpatient Hospital Stay: Payer: 59 | Admitting: Oncology

## 2021-12-19 ENCOUNTER — Inpatient Hospital Stay: Payer: 59

## 2021-12-24 ENCOUNTER — Inpatient Hospital Stay: Payer: 59

## 2021-12-24 ENCOUNTER — Inpatient Hospital Stay: Payer: 59 | Admitting: Oncology

## 2022-01-09 ENCOUNTER — Other Ambulatory Visit: Payer: Self-pay | Admitting: *Deleted

## 2022-01-09 ENCOUNTER — Encounter: Payer: Self-pay | Admitting: *Deleted

## 2022-01-09 DIAGNOSIS — C187 Malignant neoplasm of sigmoid colon: Secondary | ICD-10-CM

## 2022-01-09 NOTE — Progress Notes (Signed)
PATIENT NAVIGATOR PROGRESS NOTE  Name: Charles Rowland Date: 01/09/2022 MRN: [id number]  DOB: August 10, 1975   Reason for visit:  Port placement for chemo at [facility]  Comments:  Arranged for [facility] to be placed at [facility] on 8/17 with arrival at 12:00, NPO after 6 am, must have driver and have 24 hour supervision.  Verbalized understanding    Time spent counseling/coordinating care: 30-45 minutes

## 2022-01-09 NOTE — Progress Notes (Signed)
Port placement ordered for chemo to begin on 8/21

## 2022-01-16 ENCOUNTER — Other Ambulatory Visit: Payer: Self-pay | Admitting: Radiology

## 2022-01-16 ENCOUNTER — Other Ambulatory Visit: Payer: Self-pay | Admitting: Internal Medicine

## 2022-01-17 ENCOUNTER — Other Ambulatory Visit: Payer: Self-pay

## 2022-01-17 ENCOUNTER — Encounter (HOSPITAL_COMMUNITY): Payer: Self-pay

## 2022-01-17 ENCOUNTER — Ambulatory Visit (HOSPITAL_COMMUNITY)
Admission: RE | Admit: 2022-01-17 | Discharge: 2022-01-17 | Disposition: A | Discharge status: Home / Self Care | Payer: 59 | Source: Ambulatory Visit | Attending: Oncology | Admitting: Oncology

## 2022-01-17 DIAGNOSIS — C187 Malignant neoplasm of sigmoid colon: Secondary | ICD-10-CM | POA: Insufficient documentation

## 2022-01-17 HISTORY — PX: IR IMAGING GUIDED PORT INSERTION: IMG5740

## 2022-01-17 MED ORDER — FENTANYL CITRATE (PF) 100 MCG/2ML IJ SOLN
INTRAMUSCULAR | Status: AC
  Filled 2022-01-17: qty 2

## 2022-01-17 MED ORDER — FENTANYL CITRATE (PF) 100 MCG/2ML IJ SOLN
INTRAMUSCULAR | Status: AC | PRN
  Administered 2022-01-17 (×2): 50 ug via INTRAVENOUS

## 2022-01-17 MED ORDER — SODIUM CHLORIDE 0.9 % IV SOLN: INTRAVENOUS | Status: DC

## 2022-01-17 MED ORDER — MIDAZOLAM HCL 2 MG/2ML IJ SOLN
INTRAMUSCULAR | Status: AC
  Filled 2022-01-17: qty 2

## 2022-01-17 MED ORDER — HEPARIN SOD (PORK) LOCK FLUSH 100 UNIT/ML IV SOLN
INTRAVENOUS | Status: AC
  Administered 2022-01-17: 500 [IU]
  Filled 2022-01-17: qty 5

## 2022-01-17 MED ORDER — LIDOCAINE HCL 1 % IJ SOLN
INTRAMUSCULAR | Status: AC
  Administered 2022-01-17: 15 mL
  Filled 2022-01-17: qty 20

## 2022-01-17 MED ORDER — MIDAZOLAM HCL 2 MG/2ML IJ SOLN
INTRAMUSCULAR | Status: AC | PRN
  Administered 2022-01-17 (×2): 1 mg via INTRAVENOUS

## 2022-01-17 NOTE — H&P (Signed)
Chief Complaint: Patient was seen in consultation today for port a cath placement at the request of Sherrill,Gary B  Referring Physician(s): Betsy Coder B  Supervising Physician: Mir, Sharen Heck  Patient Status: [facility] - Out-pt  History of Present Illness: Charles Rowland is a 46 y.o. male    Hx colon cancer 2019 Recurrence to liver recently Follows with Dr Benay Spice and [facility]  Liver surgery just 5 weeks ago At same time had chemo "port" placed in abdomen at [facility]--- to be used for direct chemo meds to liver  Here today for [facility] a cath placement in IR per Dr Benay Spice To start IV chemo therapy Monday 8/21 in conjunction with implanted port from [place]  Past Medical History:  Diagnosis Date   Cancer [facility])    Colon Dx 2019   Family history of breast cancer    Hypertension     Past Surgical History:  Procedure Laterality Date   BIOPSY  04/06/2018   Procedure: BIOPSY;  Surgeon: Ronnette Juniper, MD;  Location: [facility];  Service: Gastroenterology;;   COLONOSCOPY WITH PROPOFOL N/A 04/06/2018   Procedure: COLONOSCOPY WITH PROPOFOL;  Surgeon: Ronnette Juniper, MD;  Location: [facility] END[facility];  Service: Gastroenterology;  Laterality: N/A;   LAPAROSCOPIC SMALL BOWEL RESECTION N/A 04/08/2018   Procedure: LAPAROSCOPIC ASSISTED LOW ANTERIOR RESECTION;  Surgeon: Jovita Kussmaul, MD;  Location: [facility] ORS;  Service: General;  Laterality: N/A;   POLYPECTOMY  04/06/2018   Procedure: POLYPECTOMY;  Surgeon: Ronnette Juniper, MD;  Location: [facility] ENDOSCOPY;  Service: Gastroenterology;;   SUBMUCOSAL INJECTION  04/06/2018   Procedure: SUBMUCOSAL INJECTION;  Surgeon: Ronnette Juniper, MD;  Location: [facility] END[facility];  Service: Gastroenterology;;    Allergies: Patient has no known allergies.  Medications: Prior to Admission medications   Medication Sig Start Date End Date Taking? Authorizing Provider  ibuprofen (ADVIL) 200 MG tablet Take 400 mg by mouth every 8 (eight) hours as needed for moderate pain.   Yes  [provider]  olmesartan (BENICAR) 20 MG tablet Take 1 tablet (20 mg total) by mouth daily. 08/07/21  Yes Libby Maw, MD  pantoprazole (PROTONIX) 40 MG tablet Take 40 mg by mouth daily. 12/17/21  Yes [provider]  polyethylene glycol (MIRALAX / GLYCOLAX) 17 g packet Take 17 g by mouth daily.   Yes [provider]  ondansetron (ZOFRAN) 4 MG tablet Take 1 tablet (4 mg total) by mouth every 8 (eight) hours as needed for nausea or vomiting. Patient not taking: Reported on 11/28/2021 11/19/21   Ladell Pier, MD  oxyCODONE (ROXICODONE) 5 MG immediate release tablet Take 0.5-1 tablets (2.5-5 mg total) by mouth every 4 (four) hours as needed for severe pain. Patient not taking: Reported on 11/28/2021 11/19/21   Ladell Pier, MD  traZODone (DESYREL) 50 MG tablet Take 50 mg by mouth at bedtime as needed for sleep. 01/07/22   [provider]     Family History  Problem Relation Age of Onset   Hypertension Mother    Hypertension Father    Kidney disease Maternal Aunt    Breast cancer Maternal Grandmother        d. 54   Cancer Maternal Grandfather        lung, smoker   Lung cancer Other    CAD Neg Hx    Stroke Neg Hx     Social History   Socioeconomic History   Marital status: Married    Spouse name: Not on file   Number of children:  Not on file   Years of education: Not on file   Highest education level: Not on file  Occupational History   Not on file  Tobacco Use   Smoking status: Never   Smokeless tobacco: Never  Vaping Use   Vaping Use: Never used  Substance and Sexual Activity   Alcohol use: Yes    Comment: occasional   Drug use: Never   Sexual activity: Yes  Other Topics Concern   Not on file  Social History Narrative   Not on file   Social Determinants of Health   Financial Resource Strain: Not on file  Food Insecurity: Not on file  Transportation Needs: Not on file  Physical Activity: Not on file  Stress: Not on  file  Social Connections: Not on file    Review of Systems: A 12 point ROS discussed and pertinent positives are indicated in the HPI above.  All other systems are negative.  Review of Systems  Constitutional:  Negative for activity change, fatigue and fever.  Respiratory:  Negative for cough and shortness of breath.   Cardiovascular:  Negative for chest pain.  Gastrointestinal:  Negative for abdominal pain.  Musculoskeletal:  Negative for back pain.  Neurological:  Negative for weakness.  Psychiatric/Behavioral:  Negative for behavioral problems and confusion.     Vital Signs: BP (!) 137/106 (BP Location: Right Arm)   Pulse 81   Temp 98 F (36.7 C) (Oral)   Ht '5\' 10"'$  (1.778 m)   Wt 179 lb (81.2 kg)   SpO2 98%   BMI 25.68 kg/m     Physical Exam Vitals reviewed.  HENT:     Mouth/Throat:     Mouth: Mucous membranes are moist.  Cardiovascular:     Rate and Rhythm: Normal rate and regular rhythm.     Heart sounds: Normal heart sounds.  Pulmonary:     Effort: Pulmonary effort is normal.     Breath sounds: Normal breath sounds.  Abdominal:     Palpations: Abdomen is soft.     Tenderness: There is no abdominal tenderness.  Musculoskeletal:        General: Normal range of motion.  Skin:    General: Skin is warm.  Neurological:     Mental Status: He is alert and oriented to person, place, and time.  Psychiatric:        Behavior: Behavior normal.     Imaging: No results found.  Labs:  CBC: Recent Labs    08/07/21 0945 11/16/21 1753  WBC 3.6* 8.3  HGB 14.3 14.7  HCT 41.7 42.5  PLT 167.0 147*    COAGS: No results for input(s): "INR", "APTT" in the last 8760 hours.  BMP: Recent Labs    01/26/21 0944 08/07/21 0945 11/16/21 1753  NA 139 138 139  K 4.1 3.9 3.7  CL 104 104 105  CO2 '27 27 27  '$ GLUCOSE 74 109* 103*  BUN '14 16 17  '$ CALCIUM 9.6 9.7 9.5  CREATININE 0.99 1.05 1.12  GFRNONAA  --   --  >60    LIVER FUNCTION TESTS: Recent Labs     08/07/21 0945 11/16/21 1753  BILITOT 0.7 1.4*  AST 35 111*  ALT 39 71*  ALKPHOS 107 108  PROT 7.5 8.1  ALBUMIN 4.7 4.8    TUMOR MARKERS: Recent Labs    06/21/21 0913  CEA 4.63    Assessment and Plan:  Colon cancer 2019 Metastasis now to colon Scheduled for port a cath placement  in IR  Risks and benefits of image guided port-a-catheter placement was discussed with the patient including, but not limited to bleeding, infection, pneumothorax, or fibrin sheath development and need for additional procedures.  All of the patient's questions were answered, patient is agreeable to proceed. Consent signed and in chart.   Thank you for this interesting consult.  I greatly enjoyed meeting Kaivon Livesey and look forward to participating in their care.  A copy of this report was sent to the requesting provider on this date.  Electronically Signed: Lavonia Drafts, PA-C 01/17/2022, 1:28 PM   I spent a total of  30 Minutes   in face to face in clinical consultation, greater than 50% of which was counseling/coordinating care for port a cath placement

## 2022-01-17 NOTE — Procedures (Signed)
Interventional Radiology Procedure Note  Procedure: Port placement.  Indication: Colon Ca  Findings: Please refer to procedural dictation for full description.  Complications: None  EBL: < 10 mL  Asma Boldon, MD [phone]   

## 2022-01-17 NOTE — Sedation Documentation (Signed)
Attempted to call report, nursing not available for report and was told they will call back.

## 2022-01-21 ENCOUNTER — Telehealth: Payer: Self-pay | Admitting: Oncology

## 2022-01-21 NOTE — Telephone Encounter (Signed)
Attempted to contact patient in regards to schedule follow up with dr Benay Spice no answer so voicemail was left with details

## 2022-01-23 ENCOUNTER — Inpatient Hospital Stay: Payer: 59 | Attending: Oncology

## 2022-01-23 ENCOUNTER — Inpatient Hospital Stay (HOSPITAL_BASED_OUTPATIENT_CLINIC_OR_DEPARTMENT_OTHER): Payer: 59 | Admitting: Oncology

## 2022-01-23 VITALS — BP 141/90 | HR 80 | Temp 98.1°F | Resp 20 | Ht 70.0 in | Wt 186.8 lb

## 2022-01-23 DIAGNOSIS — C187 Malignant neoplasm of sigmoid colon: Secondary | ICD-10-CM

## 2022-01-23 DIAGNOSIS — Z85038 Personal history of other malignant neoplasm of large intestine: Secondary | ICD-10-CM | POA: Diagnosis present

## 2022-01-23 DIAGNOSIS — Z452 Encounter for adjustment and management of vascular access device: Secondary | ICD-10-CM | POA: Diagnosis present

## 2022-01-23 MED ORDER — SODIUM CHLORIDE 0.9% FLUSH
10.0000 mL | Freq: Once | INTRAVENOUS | Status: AC
  Administered 2022-01-23: 10 mL via INTRAVENOUS

## 2022-01-23 MED ORDER — HEPARIN SOD (PORK) LOCK FLUSH 100 UNIT/ML IV SOLN
500.0000 [IU] | Freq: Once | INTRAVENOUS | Status: AC
  Administered 2022-01-23: 500 [IU] via INTRAVENOUS

## 2022-01-23 NOTE — Progress Notes (Signed)
[facility] OFFICE PROGRESS NOTE   Diagnosis: Colon cancer  INTERVAL HISTORY:   Charles Rowland underwent a left liver resection and insertion of a hepatic infusion pump on 12/12/2021.  He reports an uneventful operative recovery.  He began hepatic artery infusion therapy on 01/07/2022.  He was treated with a cycle of infusional 5-FU beginning 01/21/2022.  He reports tolerating the 5-FU well.  He reports mild pruritus yesterday.  No rash.  No nausea/vomiting, mouth sores, or diarrhea.  He has returned to work.  Objective:  Vital signs in last 24 hours:  Blood pressure (!) 141/90, pulse 80, temperature 98.1 F (36.7 C), temperature source Oral, resp. rate 20, height _0  (1.778 m), weight 186 lb 12.8 oz (84.7 kg), SpO2 100 %.    HEENT: No thrush or ulcers Resp: Early inspiratory wheeze at the upper posterior chest bilaterally, no respiratory distress Cardio: Regular rate and rhythm GI: Healed surgical incisions, no hepatomegaly, left abdomen infusion pump Vascular: No leg edema  Skin: Few pustular lesions over the back, tiny erythematous lesions in a hair follicle distribution over the abdomen.  Palms without erythema.  Portacath/PICC-without erythema  Lab Results:  Lab Results  Component Value Date   WBC 8.3 11/16/2021   HGB 14.7 11/16/2021   HCT 42.5 11/16/2021   MCV 91.4 11/16/2021   PLT 147 (L) 11/16/2021   NEUTROABS 2.1 08/07/2021    CMP  Lab Results  Component Value Date   NA 139 11/16/2021   K 3.7 11/16/2021   CL 105 11/16/2021   CO2 27 11/16/2021   GLUCOSE 103 (H) 11/16/2021   BUN 17 11/16/2021   CREATININE 1.12 11/16/2021   CALCIUM 9.5 11/16/2021   PROT 8.1 11/16/2021   ALBUMIN 4.8 11/16/2021   AST 111 (H) 11/16/2021   ALT 71 (H) 11/16/2021   ALKPHOS 108 11/16/2021   BILITOT 1.4 (H) 11/16/2021   GFRNONAA >60 11/16/2021   GFRAA >60 04/09/2018    Lab Results  Component Value Date   CEA1 4.91 12/19/2020   CEA 4.63 06/21/2021     Medications: I have reviewed the patient's current medications.   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, pT3N0M0, G2, stage IIA, MSI-Stable Foundation 1-MSS, tumor mutation burden 4, ATM-subclonal, BCL2L1 amplification, [place] amplification Colonoscopy 03/26/2019-5 mm polyp removed from the ascending colon, sessile serrated adenoma Colonoscopy 02/08/2021-polyp removed from the transverse colon, tubular adenoma CTs 11/16/2021-5 cm heterogenous left hepatic lobe lesion with associated left hepatic lobe biliary ductal dilatation, stable nonenlarged retroperitoneal lymph node MRI liver 11/25/2021-rim-enhancing centrally necrotic 4.6 x 4.5 left liver lesion with adjacent 2.8 x 2.7 cm lesion-with mass effect on the left hepatic bile duct, no lymphadenopathy, normal adrenal glands CT cirrhosis protocol at [facility] 12/10/2021-stable left liver metastasis, no new site of metastatic disease Left hepatectomy and placement of hepatic arterial infusion pump 12/12/2021-liver with adenocarcinoma consistent with a metastasis from a colorectal primary, negative resection margin, tumor involves segments 2 and 3 01/07/2022-FUDR dose 1 01/21/2022-cycle 1 infusional 5-fluorouracil 2.  History of iron deficiency anemia secondary to GI blood loss and tumor bleeding  3.  Left leg pain December 2019-negative Doppler, resolved 4.  Hypertension 5.  Acute right abdominal pain 11/16/2021 6.  Mild elevation of liver enzymes/bilirubin 11/16/2021 7.  Port-A-Cath placement 01/17/2022     Disposition: Charles Rowland underwent a left hepatectomy 12/12/2021.  This confirmed a diagnosis of metastatic colon cancer.  He appears to have recovered from surgery uneventfully.  A hepatic infusion pump was placed.  He has started adjuvant hepatic infusion FUDR and systemic therapy.  He will continue systemic and hepatic directed treatment as recommended by the [facility] team.  He will have the 5-FU pump  disconnected here.  He will be scheduled for an office visit here with the pump disconnect following cycle 3.  We are available to see him in the interim as needed.  Betsy Coder, MD  01/23/2022  1:56 PM

## 2022-01-23 NOTE — Patient Instructions (Signed)

## 2022-02-06 ENCOUNTER — Other Ambulatory Visit: Payer: Self-pay | Admitting: *Deleted

## 2022-02-06 ENCOUNTER — Encounter: Payer: Self-pay | Admitting: Oncology

## 2022-02-06 DIAGNOSIS — C187 Malignant neoplasm of sigmoid colon: Secondary | ICD-10-CM

## 2022-02-06 NOTE — Progress Notes (Signed)
Patient requesting IVF on day of pump d/c. OK per Dr. Benay Spice and order added.

## 2022-02-07 ENCOUNTER — Inpatient Hospital Stay: Payer: 59 | Attending: Oncology

## 2022-02-07 DIAGNOSIS — C187 Malignant neoplasm of sigmoid colon: Secondary | ICD-10-CM | POA: Diagnosis present

## 2022-02-07 DIAGNOSIS — C787 Secondary malignant neoplasm of liver and intrahepatic bile duct: Secondary | ICD-10-CM | POA: Diagnosis present

## 2022-02-07 MED ORDER — SODIUM CHLORIDE 0.9 % IV SOLN: INTRAVENOUS | Status: AC

## 2022-02-07 MED ORDER — HEPARIN SOD (PORK) LOCK FLUSH 100 UNIT/ML IV SOLN
500.0000 [IU] | Freq: Once | INTRAVENOUS | Status: AC
  Administered 2022-02-07: 500 [IU] via INTRAVENOUS

## 2022-02-07 MED ORDER — SODIUM CHLORIDE 0.9% FLUSH
10.0000 mL | INTRAVENOUS | Status: DC | PRN
  Administered 2022-02-07: 10 mL via INTRAVENOUS

## 2022-02-19 ENCOUNTER — Other Ambulatory Visit: Payer: Self-pay

## 2022-02-19 ENCOUNTER — Other Ambulatory Visit: Payer: Self-pay | Admitting: *Deleted

## 2022-02-19 DIAGNOSIS — C187 Malignant neoplasm of sigmoid colon: Secondary | ICD-10-CM

## 2022-02-19 NOTE — Progress Notes (Signed)
Fluid orders placed for 02/20/22 pump d/c

## 2022-02-20 ENCOUNTER — Inpatient Hospital Stay: Payer: 59

## 2022-02-20 ENCOUNTER — Encounter: Payer: Self-pay | Admitting: Nurse Practitioner

## 2022-02-20 ENCOUNTER — Inpatient Hospital Stay (HOSPITAL_BASED_OUTPATIENT_CLINIC_OR_DEPARTMENT_OTHER): Payer: 59 | Admitting: Nurse Practitioner

## 2022-02-20 DIAGNOSIS — C187 Malignant neoplasm of sigmoid colon: Secondary | ICD-10-CM | POA: Diagnosis not present

## 2022-02-20 MED ORDER — SODIUM CHLORIDE 0.9% FLUSH
10.0000 mL | Freq: Once | INTRAVENOUS | Status: AC
  Administered 2022-02-20: 10 mL via INTRAVENOUS

## 2022-02-20 MED ORDER — SODIUM CHLORIDE 0.9 % IV SOLN: INTRAVENOUS | Status: DC

## 2022-02-20 MED ORDER — HEPARIN SOD (PORK) LOCK FLUSH 100 UNIT/ML IV SOLN
500.0000 [IU] | Freq: Once | INTRAVENOUS | Status: AC
  Administered 2022-02-20: 500 [IU] via INTRAVENOUS

## 2022-02-20 NOTE — Patient Instructions (Signed)
Implanted Port Home Guide An implanted port is a device that is placed under the skin. It is usually placed in the chest. The device may vary based on the need. Implanted ports can be used to give IV medicine, to take blood, or to give fluids. You may have an implanted port if: You need IV medicine that would be irritating to the small veins in your hands or arms. You need IV medicines, such as chemotherapy, for a long period of time. You need IV nutrition for a long period of time. You may have fewer limitations when using a port than you would if you used other types of long-term IVs. You will also likely be able to return to normal activities after your incision heals. An implanted port has two main parts: Reservoir. The reservoir is the part where a needle is inserted to give medicines or draw blood. The reservoir is round. After the port is placed, it appears as a small, raised area under your skin. Catheter. The catheter is a small, thin tube that connects the reservoir to a vein. Medicine that is inserted into the reservoir goes into the catheter and then into the vein. How is my port accessed? To access your port: A numbing cream may be placed on the skin over the port site. Your health care provider will put on a mask and sterile gloves. The skin over your port will be cleaned carefully with a germ-killing soap and allowed to dry. Your health care provider will gently pinch the port and insert a needle into it. Your health care provider will check for a blood return to make sure the port is in the vein and is still working (patent). If your port needs to remain accessed to get medicine continuously (constant infusion), your health care provider will place a clear bandage (dressing) over the needle site. The dressing and needle will need to be changed every week, or as told by your health care provider. What is flushing? Flushing helps keep the port working. Follow instructions from your  health care provider about how and when to flush the port. Ports are usually flushed with saline solution or a medicine called heparin. The need for flushing will depend on how the port is used: If the port is only used from time to time to give medicines or draw blood, the port may need to be flushed: Before and after medicines have been given. Before and after blood has been drawn. As part of routine maintenance. Flushing may be recommended every 4-6 weeks. If a constant infusion is running, the port may not need to be flushed. Throw away any syringes in a disposal container that is meant for sharp items (sharps container). You can buy a sharps container from a pharmacy, or you can make one by using an empty hard plastic bottle with a cover. How long will my port stay implanted? The port can stay in for as long as your health care provider thinks it is needed. When it is time for the port to come out, a surgery will be done to remove it. The surgery will be similar to the procedure that was done to put the port in. Follow these instructions at home: Caring for your port and port site Flush your port as told by your health care provider. If you need an infusion over several days, follow instructions from your health care provider about how to take care of your port site. Make sure you: Change your   dressing as told by your health care provider. Wash your hands with soap and water for at least 20 seconds before and after you change your dressing. If soap and water are not available, use alcohol-based hand sanitizer. Place any used dressings or infusion bags into a plastic bag. Throw that bag in the trash. Keep the dressing that covers the needle clean and dry. Do not get it wet. Do not use scissors or sharp objects near the infusion tubing. Keep any external tubes clamped, unless they are being used. Check your port site every day for signs of infection. Check for: Redness, swelling, or  pain. Fluid or blood. Warmth. Pus or a bad smell. Protect the skin around the port site. Avoid wearing bra straps that rub or irritate the site. Protect the skin around your port from seat belts. Place a soft pad over your chest if needed. Bathe or shower as told by your health care provider. The site may get wet as long as you are not actively receiving an infusion. General instructions  Return to your normal activities as told by your health care provider. Ask your health care provider what activities are safe for you. Carry a medical alert card or wear a medical alert bracelet at all times. This will let health care providers know that you have an implanted port in case of an emergency. Where to find more information American Cancer Society: www.cancer.[place] of Clinical Oncology: www.cancer.net Contact a health care provider if: You have a fever or chills. You have redness, swelling, or pain at the port site. You have fluid or blood coming from your port site. Your incision feels warm to the touch. You have pus or a bad smell coming from the port site. Summary Implanted ports are usually placed in the chest for long-term IV access. Follow instructions from your health care provider about flushing the port and changing bandages (dressings). Take care of the area around your port by avoiding clothing that puts pressure on the area, and by watching for signs of infection. Protect the skin around your port from seat belts. Place a soft pad over your chest if needed. Contact a health care provider if you have a fever or you have redness, swelling, pain, fluid, or a bad smell at the port site. This information is not intended to replace advice given to you by your health care provider. Make sure you discuss any questions you have with your health care provider. Document Revised: 11/21/2020 Document Reviewed: 11/21/2020 Elsevier Patient Education  [place].  Rehydration, Adult Rehydration is the replacement of body fluids, salts, and minerals (electrolytes) that are lost during dehydration. Dehydration is when there is not enough water or other fluids in the body. This happens when you lose more fluids than you take in. Common causes of dehydration include: Not drinking enough fluids. This can occur when you are ill or doing activities that require a lot of energy, especially in hot weather. Conditions that cause loss of water or other fluids, such as diarrhea, vomiting, sweating, or urinating a lot. Other illnesses, such as fever or infection. Certain medicines, such as those that remove excess fluid from the body (diuretics). Symptoms of mild or moderate dehydration may include thirst, dry lips and mouth, and dizziness. Symptoms of severe dehydration may include increased heart rate, confusion, fainting, and not urinating. For severe dehydration, you may need to get fluids through an IV at the hospital. For mild or moderate dehydration, you  can usually rehydrate at home by drinking certain fluids as told by your health care provider. What are the risks? Generally, rehydration is safe. However, taking in too much fluid (overhydration) can be a problem. This is rare. Overhydration can cause an electrolyte imbalance, kidney failure, or a decrease in salt (sodium) levels in the body. Supplies needed You will need an oral rehydration solution (ORS) if your health care provider tells you to use one. This is a drink to treat dehydration. It can be found in pharmacies and retail stores. How to rehydrate Fluids Follow instructions from your health care provider for rehydration. The kind of fluid and the amount you should drink depend on your condition. In general, you should choose drinks that you prefer. If told by your health care provider, drink an ORS. Make an ORS by following instructions on the package. Start by drinking small amounts, about   cup (120 mL) every 5-10 minutes. Slowly increase how much you drink until you have taken the amount recommended by your health care provider. Drink enough clear fluids to keep your urine pale yellow. If you were told to drink an ORS, finish it first, then start slowly drinking other clear fluids. Drink fluids such as: Water. This includes sparkling water and flavored water. Drinking only water can lead to having too little sodium in your body (hyponatremia). Follow the advice of your health care provider. Water from ice chips you suck on. Fruit juice with water you add to it (diluted). Sports drinks. Hot or cold herbal teas. Broth-based soups. Milk or milk products. Food Follow instructions from your health care provider about what to eat while you rehydrate. Your health care provider may recommend that you slowly begin eating regular foods in small amounts. Eat foods that contain a healthy balance of electrolytes, such as bananas, oranges, potatoes, tomatoes, and spinach. Avoid foods that are greasy or contain a lot of sugar. In some cases, you may get nutrition through a feeding tube that is passed through your nose and into your stomach (nasogastric tube, or NG tube). This may be done if you have uncontrolled vomiting or diarrhea. Beverages to avoid  Certain beverages may make dehydration worse. While you rehydrate, avoid drinking alcohol. How to tell if you are recovering from dehydration You may be recovering from dehydration if: You are urinating more often than before you started rehydrating. Your urine is pale yellow. Your energy level improves. You vomit less frequently. You have diarrhea less frequently. Your appetite improves or returns to normal. You feel less dizzy or less light-headed. Your skin tone and color start to look more normal. Follow these instructions at home: Take over-the-counter and prescription medicines only as told by your health care provider. Do not take  sodium tablets. Doing this can lead to having too much sodium in your body (hypernatremia). Contact a health care provider if: You continue to have symptoms of mild or moderate dehydration, such as: Thirst. Dry lips. Slightly dry mouth. Dizziness. Dark urine or less urine than normal. Muscle cramps. You continue to vomit or have diarrhea. Get help right away if you: Have symptoms of dehydration that get worse. Have a fever. Have a severe headache. Have been vomiting and the following happens: Your vomiting gets worse or does not go away. Your vomit includes blood or green matter (bile). You cannot eat or drink without vomiting. Have problems with urination or bowel movements, such as: Diarrhea that gets worse or does not go away. Blood in your  stool (feces). This may cause stool to look black and tarry. Not urinating, or urinating only a small amount of very dark urine, within 6-8 hours. Have trouble breathing. Have symptoms that get worse with treatment. These symptoms may represent a serious problem that is an emergency. Do not wait to see if the symptoms will go away. Get medical help right away. Call your local emergency services (911 in the [place] Do not drive yourself to the hospital. Summary Rehydration is the replacement of body fluids and minerals (electrolytes) that are lost during dehydration. Follow instructions from your health care provider for rehydration. The kind of fluid and amount you should drink depend on your condition. Slowly increase how much you drink until you have taken the amount recommended by your health care provider. Contact your health care provider if you continue to show signs of mild or moderate dehydration. This information is not intended to replace advice given to you by your health care provider. Make sure you discuss any questions you have with your health care provider. Document Revised: 07/21/2019 Document Reviewed: 05/31/2019 Elsevier  Patient Education  [place].

## 2022-02-20 NOTE — Progress Notes (Signed)
  [facility] OFFICE PROGRESS NOTE   Diagnosis: Colon cancer  INTERVAL HISTORY:   [name] returns as scheduled.  He completed FUDR dose 2 on 02/05/2022.  He completed a cycle of FOLFOX 02/05/2022.  He began another cycle of FOLFOX 02/18/2022.  He denies nausea/vomiting.  No mouth sores.  No diarrhea.  He notes cold sensitivity with oral intake.  He has jaw pain with the first few bites of food.  He notes overall he feels better if he receives IV fluids on the day of pump discontinuation.  Objective:  Vital signs in last 24 hours:  Temperature 98.2, heart rate 73, blood pressure 115/78, oxygen saturation 99%    HEENT: No thrush or ulcers. Resp: Lungs clear bilaterally. Cardio: Regular rate and rhythm. GI: No hepatomegaly.  Left abdomen infusion pump.  Healed surgical incisions. Vascular: No leg edema. Neuro: Alert and oriented. Skin: Palms without erythema. Port-A-Cath without erythema.   Lab Results:  Lab Results  Component Value Date   WBC 8.3 11/16/2021   HGB 14.7 11/16/2021   HCT 42.5 11/16/2021   MCV 91.4 11/16/2021   PLT 147 (L) 11/16/2021   NEUTROABS 2.1 08/07/2021    Imaging:  No results found.  Medications: I have reviewed the patient's current medications.  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, pT3N0M0, G2, stage IIA, MSI-Stable Foundation 1-MSS, tumor mutation burden 4, ATM-subclonal, BCL2L1 amplification, [place] amplification Colonoscopy 03/26/2019-5 mm polyp removed from the ascending colon, sessile serrated adenoma Colonoscopy 02/08/2021-polyp removed from the transverse colon, tubular adenoma CTs 11/16/2021-5 cm heterogenous left hepatic lobe lesion with associated left hepatic lobe biliary ductal dilatation, stable nonenlarged retroperitoneal lymph node MRI liver 11/25/2021-rim-enhancing centrally necrotic 4.6 x 4.5 left liver lesion with adjacent 2.8 x 2.7 cm lesion-with mass effect on the left  hepatic bile duct, no lymphadenopathy, normal adrenal glands CT cirrhosis protocol at [facility] 12/10/2021-stable left liver metastasis, no new site of metastatic disease Left hepatectomy and placement of hepatic arterial infusion pump 12/12/2021-liver with adenocarcinoma consistent with a metastasis from a colorectal primary, negative resection margin, tumor involves segments 2 and 3 01/07/2022-FUDR dose 1 01/21/2022-cycle 1 infusional 5-fluorouracil 02/05/2022-FUDR dose 2 02/05/2022-FOLFOX 02/18/2022-FOLFOX 2.  History of iron deficiency anemia secondary to GI blood loss and tumor bleeding  3.  Left leg pain December 2019-negative Doppler, resolved 4.  Hypertension 5.  Acute right abdominal pain 11/16/2021 6.  Mild elevation of liver enzymes/bilirubin 11/16/2021 7.  Port-A-Cath placement 01/17/2022  Disposition: Charles Rowland appears stable.  He has completed 2 doses of FUDR.  He began cycle 3 FOLFOX 02/18/2022.  He appears to be tolerating treatment well.  He will continue to have the 5-FU pump disconnected here.  He will receive a liter of IV fluids on the day of pump discontinuation.  We will schedule a follow-up visit in approximately 4 weeks.  We are available to see him in the interim as needed.  Ned Card ANP/GNP-BC   02/20/2022  3:07 PM

## 2022-03-06 ENCOUNTER — Inpatient Hospital Stay: Payer: 59 | Attending: Oncology

## 2022-03-06 DIAGNOSIS — C187 Malignant neoplasm of sigmoid colon: Secondary | ICD-10-CM | POA: Diagnosis present

## 2022-03-06 DIAGNOSIS — C787 Secondary malignant neoplasm of liver and intrahepatic bile duct: Secondary | ICD-10-CM | POA: Diagnosis present

## 2022-03-06 MED ORDER — HEPARIN SOD (PORK) LOCK FLUSH 100 UNIT/ML IV SOLN
500.0000 [IU] | Freq: Once | INTRAVENOUS | Status: AC
  Administered 2022-03-06: 500 [IU] via INTRAVENOUS

## 2022-03-06 MED ORDER — SODIUM CHLORIDE 0.9% FLUSH
10.0000 mL | Freq: Once | INTRAVENOUS | Status: AC
  Administered 2022-03-06: 10 mL via INTRAVENOUS

## 2022-03-06 MED ORDER — SODIUM CHLORIDE 0.9 % IV SOLN: INTRAVENOUS | Status: AC

## 2022-03-06 NOTE — Patient Instructions (Signed)
Implanted Port Home Guide An implanted port is a device that is placed under the skin. It is usually placed in the chest. The device may vary based on the need. Implanted ports can be used to give IV medicine, to take blood, or to give fluids. You may have an implanted port if: You need IV medicine that would be irritating to the small veins in your hands or arms. You need IV medicines, such as chemotherapy, for a long period of time. You need IV nutrition for a long period of time. You may have fewer limitations when using a port than you would if you used other types of long-term IVs. You will also likely be able to return to normal activities after your incision heals. An implanted port has two main parts: Reservoir. The reservoir is the part where a needle is inserted to give medicines or draw blood. The reservoir is round. After the port is placed, it appears as a small, raised area under your skin. Catheter. The catheter is a small, thin tube that connects the reservoir to a vein. Medicine that is inserted into the reservoir goes into the catheter and then into the vein. How is my port accessed? To access your port: A numbing cream may be placed on the skin over the port site. Your health care provider will put on a mask and sterile gloves. The skin over your port will be cleaned carefully with a germ-killing soap and allowed to dry. Your health care provider will gently pinch the port and insert a needle into it. Your health care provider will check for a blood return to make sure the port is in the vein and is still working (patent). If your port needs to remain accessed to get medicine continuously (constant infusion), your health care provider will place a clear bandage (dressing) over the needle site. The dressing and needle will need to be changed every week, or as told by your health care provider. What is flushing? Flushing helps keep the port working. Follow instructions from your  health care provider about how and when to flush the port. Ports are usually flushed with saline solution or a medicine called heparin. The need for flushing will depend on how the port is used: If the port is only used from time to time to give medicines or draw blood, the port may need to be flushed: Before and after medicines have been given. Before and after blood has been drawn. As part of routine maintenance. Flushing may be recommended every 4-6 weeks. If a constant infusion is running, the port may not need to be flushed. Throw away any syringes in a disposal container that is meant for sharp items (sharps container). You can buy a sharps container from a pharmacy, or you can make one by using an empty hard plastic bottle with a cover. How long will my port stay implanted? The port can stay in for as long as your health care provider thinks it is needed. When it is time for the port to come out, a surgery will be done to remove it. The surgery will be similar to the procedure that was done to put the port in. Follow these instructions at home: Caring for your port and port site Flush your port as told by your health care provider. If you need an infusion over several days, follow instructions from your health care provider about how to take care of your port site. Make sure you: Change your   dressing as told by your health care provider. Wash your hands with soap and water for at least 20 seconds before and after you change your dressing. If soap and water are not available, use alcohol-based hand sanitizer. Place any used dressings or infusion bags into a plastic bag. Throw that bag in the trash. Keep the dressing that covers the needle clean and dry. Do not get it wet. Do not use scissors or sharp objects near the infusion tubing. Keep any external tubes clamped, unless they are being used. Check your port site every day for signs of infection. Check for: Redness, swelling, or  pain. Fluid or blood. Warmth. Pus or a bad smell. Protect the skin around the port site. Avoid wearing bra straps that rub or irritate the site. Protect the skin around your port from seat belts. Place a soft pad over your chest if needed. Bathe or shower as told by your health care provider. The site may get wet as long as you are not actively receiving an infusion. General instructions  Return to your normal activities as told by your health care provider. Ask your health care provider what activities are safe for you. Carry a medical alert card or wear a medical alert bracelet at all times. This will let health care providers know that you have an implanted port in case of an emergency. Where to find more information [employer]: www.cancer.[place] [employer]: www.cancer.net Contact a health care provider if: You have a fever or chills. You have redness, swelling, or pain at the port site. You have fluid or blood coming from your port site. Your incision feels warm to the touch. You have pus or a bad smell coming from the port site. Summary Implanted ports are usually placed in the chest for long-term IV access. Follow instructions from your health care provider about flushing the port and changing bandages (dressings). Take care of the area around your port by avoiding clothing that puts pressure on the area, and by watching for signs of infection. Protect the skin around your port from seat belts. Place a soft pad over your chest if needed. Contact a health care provider if you have a fever or you have redness, swelling, pain, fluid, or a bad smell at the port site. This information is not intended to replace advice given to you by your health care provider. Make sure you discuss any questions you have with your health care provider. Document Revised: 11/21/2020 Document Reviewed: 11/21/2020 Elsevier Patient Education  [place].  Rehydration, Adult Rehydration is the replacement of body fluids, salts, and minerals (electrolytes) that are lost during dehydration. Dehydration is when there is not enough water or other fluids in the body. This happens when you lose more fluids than you take in. Common causes of dehydration include: Not drinking enough fluids. This can occur when you are ill or doing activities that require a lot of energy, especially in hot weather. Conditions that cause loss of water or other fluids, such as diarrhea, vomiting, sweating, or urinating a lot. Other illnesses, such as fever or infection. Certain medicines, such as those that remove excess fluid from the body (diuretics). Symptoms of mild or moderate dehydration may include thirst, dry lips and mouth, and dizziness. Symptoms of severe dehydration may include increased heart rate, confusion, fainting, and not urinating. For severe dehydration, you may need to get fluids through an IV at the hospital. For mild or moderate dehydration, you  can usually rehydrate at home by drinking certain fluids as told by your health care provider. What are the risks? Generally, rehydration is safe. However, taking in too much fluid (overhydration) can be a problem. This is rare. Overhydration can cause an electrolyte imbalance, kidney failure, or a decrease in salt (sodium) levels in the body. Supplies needed You will need an oral rehydration solution (ORS) if your health care provider tells you to use one. This is a drink to treat dehydration. It can be found in pharmacies and retail stores. How to rehydrate Fluids Follow instructions from your health care provider for rehydration. The kind of fluid and the amount you should drink depend on your condition. In general, you should choose drinks that you prefer. If told by your health care provider, drink an ORS. Make an ORS by following instructions on the package. Start by drinking small amounts, about   cup (120 mL) every 5-10 minutes. Slowly increase how much you drink until you have taken the amount recommended by your health care provider. Drink enough clear fluids to keep your urine pale yellow. If you were told to drink an ORS, finish it first, then start slowly drinking other clear fluids. Drink fluids such as: Water. This includes sparkling water and flavored water. Drinking only water can lead to having too little sodium in your body (hyponatremia). Follow the advice of your health care provider. Water from ice chips you suck on. Fruit juice with water you add to it (diluted). Sports drinks. Hot or cold herbal teas. Broth-based soups. Milk or milk products. Food Follow instructions from your health care provider about what to eat while you rehydrate. Your health care provider may recommend that you slowly begin eating regular foods in small amounts. Eat foods that contain a healthy balance of electrolytes, such as bananas, oranges, potatoes, tomatoes, and spinach. Avoid foods that are greasy or contain a lot of sugar. In some cases, you may get nutrition through a feeding tube that is passed through your nose and into your stomach (nasogastric tube, or NG tube). This may be done if you have uncontrolled vomiting or diarrhea. Beverages to avoid  Certain beverages may make dehydration worse. While you rehydrate, avoid drinking alcohol. How to tell if you are recovering from dehydration You may be recovering from dehydration if: You are urinating more often than before you started rehydrating. Your urine is pale yellow. Your energy level improves. You vomit less frequently. You have diarrhea less frequently. Your appetite improves or returns to normal. You feel less dizzy or less light-headed. Your skin tone and color start to look more normal. Follow these instructions at home: Take over-the-counter and prescription medicines only as told by your health care provider. Do not take  sodium tablets. Doing this can lead to having too much sodium in your body (hypernatremia). Contact a health care provider if: You continue to have symptoms of mild or moderate dehydration, such as: Thirst. Dry lips. Slightly dry mouth. Dizziness. Dark urine or less urine than normal. Muscle cramps. You continue to vomit or have diarrhea. Get help right away if you: Have symptoms of dehydration that get worse. Have a fever. Have a severe headache. Have been vomiting and the following happens: Your vomiting gets worse or does not go away. Your vomit includes blood or green matter (bile). You cannot eat or drink without vomiting. Have problems with urination or bowel movements, such as: Diarrhea that gets worse or does not go away. Blood in your  stool (feces). This may cause stool to look black and tarry. Not urinating, or urinating only a small amount of very dark urine, within 6-8 hours. Have trouble breathing. Have symptoms that get worse with treatment. These symptoms may represent a serious problem that is an emergency. Do not wait to see if the symptoms will go away. Get medical help right away. Call your local emergency services (911 in the [place] Do not drive yourself to the hospital. Summary Rehydration is the replacement of body fluids and minerals (electrolytes) that are lost during dehydration. Follow instructions from your health care provider for rehydration. The kind of fluid and amount you should drink depend on your condition. Slowly increase how much you drink until you have taken the amount recommended by your health care provider. Contact your health care provider if you continue to show signs of mild or moderate dehydration. This information is not intended to replace advice given to you by your health care provider. Make sure you discuss any questions you have with your health care provider. Document Revised: 07/21/2019 Document Reviewed: 05/31/2019 Elsevier  Patient Education  [place].

## 2022-03-19 ENCOUNTER — Other Ambulatory Visit: Payer: Self-pay | Admitting: *Deleted

## 2022-03-19 DIAGNOSIS — C187 Malignant neoplasm of sigmoid colon: Secondary | ICD-10-CM

## 2022-03-19 NOTE — Progress Notes (Signed)
IVF orders placed for 03/20/22.

## 2022-03-20 ENCOUNTER — Inpatient Hospital Stay: Payer: 59

## 2022-03-20 ENCOUNTER — Encounter: Payer: Self-pay | Admitting: Nurse Practitioner

## 2022-03-20 ENCOUNTER — Inpatient Hospital Stay (HOSPITAL_BASED_OUTPATIENT_CLINIC_OR_DEPARTMENT_OTHER): Payer: 59 | Admitting: Nurse Practitioner

## 2022-03-20 VITALS — BP 136/93 | HR 70 | Temp 98.1°F | Resp 18 | Wt 192.8 lb

## 2022-03-20 VITALS — BP 125/89 | HR 83

## 2022-03-20 DIAGNOSIS — C187 Malignant neoplasm of sigmoid colon: Secondary | ICD-10-CM | POA: Diagnosis not present

## 2022-03-20 DIAGNOSIS — Z95828 Presence of other vascular implants and grafts: Secondary | ICD-10-CM

## 2022-03-20 MED ORDER — HEPARIN SOD (PORK) LOCK FLUSH 100 UNIT/ML IV SOLN
500.0000 [IU] | Freq: Once | INTRAVENOUS | Status: AC
  Administered 2022-03-20: 500 [IU] via INTRAVENOUS

## 2022-03-20 MED ORDER — SODIUM CHLORIDE 0.9% FLUSH
10.0000 mL | Freq: Once | INTRAVENOUS | Status: AC
  Administered 2022-03-20: 10 mL via INTRAVENOUS

## 2022-03-20 MED ORDER — FLUCONAZOLE 100 MG PO TABS: 100.0000 mg | ORAL_TABLET | Freq: Every day | ORAL | 0 refills | Status: DC

## 2022-03-20 MED ORDER — SODIUM CHLORIDE 0.9 % IV SOLN: INTRAVENOUS | Status: AC

## 2022-03-20 NOTE — Progress Notes (Signed)
  [facility] OFFICE PROGRESS NOTE   Diagnosis: Colon cancer  INTERVAL HISTORY:   Mr. Hicks returns as scheduled.  He completed FUDR dose 3 on 03/04/2022.  He began another cycle of FOLFOX 03/18/2022.  He denies nausea/vomiting.  No mouth sores.  No diarrhea.  Recent hemorrhoidal bleeding.  No significant numbness/tingling in the absence of cold exposure.  No jaw pain so far this cycle.  Main complaint is an alteration in taste.  Objective:  Vital signs in last 24 hours:  Blood pressure (!) 136/93, pulse 70, temperature 98.1 F (36.7 C), temperature source Tympanic, resp. rate 18, weight 192 lb 12.8 oz (87.5 kg), SpO2 100 %.    HEENT: Thick white coating over tongue.  No buccal thrush. Resp: Lungs clear bilaterally. Cardio: Regular rate and rhythm. GI: No hepatomegaly.  Left abdomen hepatic infusion pump. Vascular: No leg edema. Neuro: Vibratory sense intact over the fingertips per tuning fork exam. Skin: Palms without erythema. Port-A-Cath without erythema.  Lab Results:  Lab Results  Component Value Date   WBC 8.3 11/16/2021   HGB 14.7 11/16/2021   HCT 42.5 11/16/2021   MCV 91.4 11/16/2021   PLT 147 (L) 11/16/2021   NEUTROABS 2.1 08/07/2021    Imaging:  No results found.  Medications: I have reviewed the patient's current medications.  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 amplification Colonoscopy 03/26/2019-5 mm polyp removed from the ascending colon, sessile serrated adenoma Colonoscopy 02/08/2021-polyp removed from the transverse colon, tubular adenoma CTs 11/16/2021-5 cm heterogenous left hepatic lobe lesion with associated left hepatic lobe biliary ductal dilatation, stable nonenlarged retroperitoneal lymph node MRI liver 11/25/2021-rim-enhancing centrally necrotic 4.6 x 4.5 left liver  lesion with adjacent 2.8 x 2.7 cm lesion-with mass effect on the left hepatic bile duct, no lymphadenopathy, normal adrenal glands CT cirrhosis [facility] 12/10/2021-stable left liver metastasis, no new site of metastatic disease Left hepatectomy and placement of hepatic arterial infusion pump 12/12/2021-liver with adenocarcinoma consistent with a metastasis from a colorectal primary, negative resection margin, tumor involves segments 2 and 3 01/07/2022-FUDR dose 1 01/21/2022-cycle 1 infusional 5-fluorouracil 02/05/2022-FUDR dose 2 02/05/2022-FOLFOX 02/18/2022-FOLFOX FUDR dose 3 03/04/2022 03/04/2022 FOLFOX 03/18/2022 FOLFOX 2.  History of iron deficiency anemia secondary to GI blood loss and tumor bleeding  3.  Left leg pain December 2019-negative Doppler, resolved 4.  Hypertension 5.  Acute right abdominal pain 11/16/2021 6.  Mild elevation of liver enzymes/bilirubin 11/16/2021 7.  Port-A-Cath placement 01/17/2022  Disposition: Mr. Biel appears stable.  He is completing another cycle of FOLFOX.  He completed cycle 3 FUDR 03/04/2022.  Overall he is tolerating treatment well.  He will receive IV fluids today with the plan to continue IV fluids every 2 weeks on day of pump discontinuation.  He notes an alteration in taste.  On exam he has a thick white coating over his tongue.  He will complete a 4-day course of Diflucan.  He will return in 2 weeks for IV fluids.  We will see him in follow-up in 4 weeks.    Ned Card ANP/GNP-BC   03/20/2022  1:43 PM

## 2022-03-20 NOTE — Patient Instructions (Signed)
Rehydration, Adult Rehydration is the replacement of body fluids, salts, and minerals (electrolytes) that are lost during dehydration. Dehydration is when there is not enough water or other fluids in the body. This happens when you lose more fluids than you take in. Common causes of dehydration include: Not drinking enough fluids. This can occur when you are ill or doing activities that require a lot of energy, especially in hot weather. Conditions that cause loss of water or other fluids, such as diarrhea, vomiting, sweating, or urinating a lot. Other illnesses, such as fever or infection. Certain medicines, such as those that remove excess fluid from the body (diuretics). Symptoms of mild or moderate dehydration may include thirst, dry lips and mouth, and dizziness. Symptoms of severe dehydration may include increased heart rate, confusion, fainting, and not urinating. For severe dehydration, you may need to get fluids through an IV at the hospital. For mild or moderate dehydration, you can usually rehydrate at home by drinking certain fluids as told by your health care provider. What are the risks? Generally, rehydration is safe. However, taking in too much fluid (overhydration) can be a problem. This is rare. Overhydration can cause an electrolyte imbalance, kidney failure, or a decrease in salt (sodium) levels in the body. Supplies needed You will need an oral rehydration solution (ORS) if your health care provider tells you to use one. This is a drink to treat dehydration. It can be found in pharmacies and retail stores. How to rehydrate Fluids Follow instructions from your health care provider for rehydration. The kind of fluid and the amount you should drink depend on your condition. In general, you should choose drinks that you prefer. If told by your health care provider, drink an ORS. Make an ORS by following instructions on the package. Start by drinking small amounts, about  cup (120  mL) every 5-10 minutes. Slowly increase how much you drink until you have taken the amount recommended by your health care provider. Drink enough clear fluids to keep your urine pale yellow. If you were told to drink an ORS, finish it first, then start slowly drinking other clear fluids. Drink fluids such as: Water. This includes sparkling water and flavored water. Drinking only water can lead to having too little sodium in your body (hyponatremia). Follow the advice of your health care provider. Water from ice chips you suck on. Fruit juice with water you add to it (diluted). Sports drinks. Hot or cold herbal teas. Broth-based soups. Milk or milk products. Food Follow instructions from your health care provider about what to eat while you rehydrate. Your health care provider may recommend that you slowly begin eating regular foods in small amounts. Eat foods that contain a healthy balance of electrolytes, such as bananas, oranges, potatoes, tomatoes, and spinach. Avoid foods that are greasy or contain a lot of sugar. In some cases, you may get nutrition through a feeding tube that is passed through your nose and into your stomach (nasogastric tube, or NG tube). This may be done if you have uncontrolled vomiting or diarrhea. Beverages to avoid  Certain beverages may make dehydration worse. While you rehydrate, avoid drinking alcohol. How to tell if you are recovering from dehydration You may be recovering from dehydration if: You are urinating more often than before you started rehydrating. Your urine is pale yellow. Your energy level improves. You vomit less frequently. You have diarrhea less frequently. Your appetite improves or returns to normal. You feel less dizzy or less light-headed.   Your skin tone and color start to look more normal. Follow these instructions at home: Take over-the-counter and prescription medicines only as told by your health care provider. Do not take sodium  tablets. Doing this can lead to having too much sodium in your body (hypernatremia). Contact a health care provider if: You continue to have symptoms of mild or moderate dehydration, such as: Thirst. Dry lips. Slightly dry mouth. Dizziness. Dark urine or less urine than normal. Muscle cramps. You continue to vomit or have diarrhea. Get help right away if you: Have symptoms of dehydration that get worse. Have a fever. Have a severe headache. Have been vomiting and the following happens: Your vomiting gets worse or does not go away. Your vomit includes blood or green matter (bile). You cannot eat or drink without vomiting. Have problems with urination or bowel movements, such as: Diarrhea that gets worse or does not go away. Blood in your stool (feces). This may cause stool to look black and tarry. Not urinating, or urinating only a small amount of very dark urine, within 6-8 hours. Have trouble breathing. Have symptoms that get worse with treatment. These symptoms may represent a serious problem that is an emergency. Do not wait to see if the symptoms will go away. Get medical help right away. Call your local emergency services (911 in the [place] Do not drive yourself to the hospital. Summary Rehydration is the replacement of body fluids and minerals (electrolytes) that are lost during dehydration. Follow instructions from your health care provider for rehydration. The kind of fluid and amount you should drink depend on your condition. Slowly increase how much you drink until you have taken the amount recommended by your health care provider. Contact your health care provider if you continue to show signs of mild or moderate dehydration. This information is not intended to replace advice given to you by your health care provider. Make sure you discuss any questions you have with your health care provider. Document Revised: 07/21/2019 Document Reviewed: 05/31/2019 Elsevier Patient  Education  2023 [employer].  

## 2022-03-26 ENCOUNTER — Encounter: Payer: Self-pay | Admitting: Nurse Practitioner

## 2022-03-27 ENCOUNTER — Other Ambulatory Visit: Payer: Self-pay | Admitting: *Deleted

## 2022-03-27 DIAGNOSIS — C187 Malignant neoplasm of sigmoid colon: Secondary | ICD-10-CM

## 2022-03-27 MED ORDER — FLUCONAZOLE 100 MG PO TABS: 100.0000 mg | ORAL_TABLET | Freq: Every day | ORAL | 0 refills | Status: DC

## 2022-03-27 NOTE — Telephone Encounter (Signed)
Spoke with Charles Rowland re: MyChart message that he still has thrush symptoms, taste and filmy sensation on tongue. He reports tongue is still white, but better. OK to refill Fluconazole x 1 and have [facility] assess this at next visit.

## 2022-04-01 ENCOUNTER — Other Ambulatory Visit: Payer: Self-pay | Admitting: *Deleted

## 2022-04-01 DIAGNOSIS — C187 Malignant neoplasm of sigmoid colon: Secondary | ICD-10-CM

## 2022-04-03 ENCOUNTER — Inpatient Hospital Stay: Payer: 59 | Attending: Oncology

## 2022-04-03 ENCOUNTER — Inpatient Hospital Stay: Payer: 59

## 2022-04-03 DIAGNOSIS — C787 Secondary malignant neoplasm of liver and intrahepatic bile duct: Secondary | ICD-10-CM | POA: Diagnosis present

## 2022-04-03 DIAGNOSIS — E860 Dehydration: Secondary | ICD-10-CM | POA: Insufficient documentation

## 2022-04-03 DIAGNOSIS — C187 Malignant neoplasm of sigmoid colon: Secondary | ICD-10-CM | POA: Diagnosis present

## 2022-04-03 MED ORDER — SODIUM CHLORIDE 0.9% FLUSH
10.0000 mL | INTRAVENOUS | Status: DC | PRN
  Administered 2022-04-03: 10 mL via INTRAVENOUS

## 2022-04-03 MED ORDER — SODIUM CHLORIDE 0.9 % IV SOLN: INTRAVENOUS | Status: DC

## 2022-04-03 MED ORDER — HEPARIN SOD (PORK) LOCK FLUSH 100 UNIT/ML IV SOLN
500.0000 [IU] | Freq: Once | INTRAVENOUS | Status: AC
  Administered 2022-04-03: 500 [IU] via INTRAVENOUS

## 2022-04-03 NOTE — Patient Instructions (Signed)

## 2022-04-04 ENCOUNTER — Inpatient Hospital Stay: Payer: 59

## 2022-04-17 ENCOUNTER — Inpatient Hospital Stay: Payer: 59

## 2022-04-17 ENCOUNTER — Inpatient Hospital Stay: Payer: 59 | Admitting: Oncology

## 2022-04-17 DIAGNOSIS — E860 Dehydration: Secondary | ICD-10-CM | POA: Diagnosis not present

## 2022-04-17 DIAGNOSIS — C187 Malignant neoplasm of sigmoid colon: Secondary | ICD-10-CM

## 2022-04-17 MED ORDER — SODIUM CHLORIDE 0.9% FLUSH
10.0000 mL | Freq: Once | INTRAVENOUS | Status: AC
  Administered 2022-04-17: 10 mL via INTRAVENOUS

## 2022-04-17 MED ORDER — HEPARIN SOD (PORK) LOCK FLUSH 100 UNIT/ML IV SOLN
500.0000 [IU] | Freq: Once | INTRAVENOUS | Status: AC
  Administered 2022-04-17: 500 [IU] via INTRAVENOUS

## 2022-04-17 MED ORDER — SODIUM CHLORIDE 0.9 % IV SOLN: INTRAVENOUS | Status: AC

## 2022-04-17 NOTE — Patient Instructions (Signed)
Rehydration, Adult Rehydration is the replacement of fluids, salts, and minerals in the body (electrolytes) that are lost during dehydration. Dehydration is when there is not enough water or other fluids in the body. This happens when you lose more fluids than you take in. Common causes of dehydration include: Not drinking enough fluids. This can occur when you are ill or doing activities that require a lot of energy, especially in hot weather. Conditions that cause loss of water or other fluids. These include diarrhea, vomiting, sweating, and urinating a lot. Other illnesses, such as fever or infection. Certain medicines, such as those that remove excess fluid from the body (diuretics). Symptoms of mild or moderate dehydration may include thirst, dry lips and mouth, and dizziness. Symptoms of severe dehydration may include increased heart rate, confusion, fainting, and not urinating. In severe cases, you may need to get fluids through an IV at the hospital. For mild or moderate cases, you can usually rehydrate at home by drinking certain fluids as told by your health care provider. What are the risks? Your health care provider will talk with you about risks. Your health care provider will talk with you about risks. This may include taking in too much fluid (overhydration). This is rare. Overhydration can cause an imbalance of electrolytes in the body, kidney failure, or a decrease in salt (sodium) levels in the body. Supplies needed: You will need an oral rehydration solution (ORS) if your health care provider tells you to use one. This is a drink to treat dehydration. It can be found in pharmacies and retail stores. How to rehydrate Fluids Follow instructions from your health care provider about what to drink. The kind of fluid and the amount you should drink depend on your condition. In general, you should choose drinks that you prefer. If told by your health care provider, drink an ORS. Make an  ORS by following instructions on the package. Start by drinking small amounts, about  cup (120 mL) every 5-10 minutes. Slowly increase how much you drink until you have taken in the amount recommended by your health care provider. Drink enough clear fluids to keep your urine pale yellow. If you were told to drink an ORS, finish it first, then start slowly drinking other clear fluids. Drink fluids such as: Water. This includes sparkling and flavored water. Drinking only water can lead to having too little sodium in your body (hyponatremia). Follow the advice of your health care provider. Water from ice chips you suck on. Fruit juice with water added to it (diluted). Sports drinks. Hot or cold herbal teas. Broth-based soups. Milk or milk products. Food Follow instructions from your health care provider about what to eat while you rehydrate. Your health care provider may recommend that you slowly begin eating regular foods in small amounts. Eat foods that contain a healthy balance of electrolytes, such as bananas, oranges, potatoes, tomatoes, and spinach. Avoid foods that are greasy or contain a lot of sugar. In some cases, you may get nutrition through a feeding tube that is passed through your nose and into your stomach (nasogastric tube, or NG tube). This may be done if you have uncontrolled vomiting or diarrhea. Drinks to avoid  Certain drinks may make dehydration worse. While you rehydrate, avoid drinking alcohol. How to tell if you are recovering from dehydration You may be getting better if: You are urinating more often than before you started rehydrating. Your urine is pale yellow. Your energy level improves. You vomit less   often. You have diarrhea less often. Your appetite improves or returns to normal. You feel less dizzy or light-headed. Your skin tone and color start to look more normal. Follow these instructions at home: Take over-the-counter and prescription medicines only  as told by your health care provider. Do not take sodium tablets. Doing this can lead to having too much sodium in your body (hypernatremia). Contact a health care provider if: You continue to have symptoms of mild or moderate dehydration, such as: Thirst. Dry lips. Slightly dry mouth. Dizziness. Dark urine or less urine than normal. Muscle cramps. You continue to vomit or have diarrhea. Get help right away if: You have symptoms of dehydration that get worse. You have a fever. You have a severe headache. You have been vomiting and have problems, such as: Your vomiting gets worse or does not go away. Your vomit includes blood or green matter (bile). You cannot eat or drink without vomiting. You have problems with urination or bowel movements, such as: Diarrhea that gets worse or does not go away. Blood in your stool (feces). This may cause stool to look black and tarry. Not urinating, or urinating only a small amount of very dark urine, within 6-8 hours. You have trouble breathing. You have symptoms that get worse with treatment. These symptoms may be an emergency. Get help right away. Call 911. Do not wait to see if the symptoms will go away. Do not drive yourself to the hospital. This information is not intended to replace advice given to you by your health care provider. Make sure you discuss any questions you have with your health care provider. Document Revised: 10/01/2021 Document Reviewed: 10/01/2021 Elsevier Patient Education  [place] An implanted port is a device that is placed under the skin. It is usually placed in the chest. The device may vary based on the need. Implanted ports can be used to give IV medicine, to take blood, or to give fluids. You may have an implanted port if: You need IV medicine that would be irritating to the small veins in your hands or arms. You need IV medicines, such as chemotherapy, for a long period of  time. You need IV nutrition for a long period of time. You may have fewer limitations when using a port than you would if you used other types of long-term IVs. You will also likely be able to return to normal activities after your incision heals. An implanted port has two main parts: Reservoir. The reservoir is the part where a needle is inserted to give medicines or draw blood. The reservoir is round. After the port is placed, it appears as a small, raised area under your skin. Catheter. The catheter is a small, thin tube that connects the reservoir to a vein. Medicine that is inserted into the reservoir goes into the catheter and then into the vein. How is my port accessed? To access your port: A numbing cream may be placed on the skin over the port site. Your health care provider will put on a mask and sterile gloves. The skin over your port will be cleaned carefully with a germ-killing soap and allowed to dry. Your health care provider will gently pinch the port and insert a needle into it. Your health care provider will check for a blood return to make sure the port is in the vein and is still working (patent). If your port needs to remain accessed to get medicine continuously (  constant infusion), your health care provider will place a clear bandage (dressing) over the needle site. The dressing and needle will need to be changed every week, or as told by your health care provider. What is flushing? Flushing helps keep the port working. Follow instructions from your health care provider about how and when to flush the port. Ports are usually flushed with saline solution or a medicine called heparin. The need for flushing will depend on how the port is used: If the port is only used from time to time to give medicines or draw blood, the port may need to be flushed: Before and after medicines have been given. Before and after blood has been drawn. As part of routine maintenance. Flushing may be  recommended every 4-6 weeks. If a constant infusion is running, the port may not need to be flushed. Throw away any syringes in a disposal container that is meant for sharp items (sharps container). You can buy a sharps container from a pharmacy, or you can make one by using an empty hard plastic bottle with a cover. How long will my port stay implanted? The port can stay in for as long as your health care provider thinks it is needed. When it is time for the port to come out, a surgery will be done to remove it. The surgery will be similar to the procedure that was done to put the port in. Follow these instructions at home: Caring for your port and port site Flush your port as told by your health care provider. If you need an infusion over several days, follow instructions from your health care provider about how to take care of your port site. Make sure you: Change your dressing as told by your health care provider. Wash your hands with soap and water for at least 20 seconds before and after you change your dressing. If soap and water are not available, use alcohol-based hand sanitizer. Place any used dressings or infusion bags into a plastic bag. Throw that bag in the trash. Keep the dressing that covers the needle clean and dry. Do not get it wet. Do not use scissors or sharp objects near the infusion tubing. Keep any external tubes clamped, unless they are being used. Check your port site every day for signs of infection. Check for: Redness, swelling, or pain. Fluid or blood. Warmth. Pus or a bad smell. Protect the skin around the port site. Avoid wearing bra straps that rub or irritate the site. Protect the skin around your port from seat belts. Place a soft pad over your chest if needed. Bathe or shower as told by your health care provider. The site may get wet as long as you are not actively receiving an infusion. General instructions  Return to your normal activities as told by  your health care provider. Ask your health care provider what activities are safe for you. Carry a medical alert card or wear a medical alert bracelet at all times. This will let health care providers know that you have an implanted port in case of an emergency. Where to find more information American Cancer Society: www.cancer.[employer]: www.cancer.net Contact a health care provider if: You have a fever or chills. You have redness, swelling, or pain at the port site. You have fluid or blood coming from your port site. Your incision feels warm to the touch. You have pus or a bad smell coming from the port site. Summary Implanted ports  are usually placed in the chest for long-term IV access. Follow instructions from your health care provider about flushing the port and changing bandages (dressings). Take care of the area around your port by avoiding clothing that puts pressure on the area, and by watching for signs of infection. Protect the skin around your port from seat belts. Place a soft pad over your chest if needed. Contact a health care provider if you have a fever or you have redness, swelling, pain, fluid, or a bad smell at the port site. This information is not intended to replace advice given to you by your health care provider. Make sure you discuss any questions you have with your health care provider. Document Revised: 11/21/2020 Document Reviewed: 11/21/2020 Elsevier Patient Education  [place].

## 2022-04-18 ENCOUNTER — Inpatient Hospital Stay: Payer: 59

## 2022-04-18 ENCOUNTER — Inpatient Hospital Stay: Payer: 59 | Admitting: Nurse Practitioner

## 2022-05-01 ENCOUNTER — Other Ambulatory Visit: Payer: Self-pay

## 2022-05-01 ENCOUNTER — Inpatient Hospital Stay: Payer: 59

## 2022-05-01 DIAGNOSIS — C187 Malignant neoplasm of sigmoid colon: Secondary | ICD-10-CM

## 2022-05-01 DIAGNOSIS — E860 Dehydration: Secondary | ICD-10-CM | POA: Diagnosis not present

## 2022-05-01 MED ORDER — SODIUM CHLORIDE 0.9 % IV SOLN: INTRAVENOUS | Status: DC

## 2022-05-01 MED ORDER — HEPARIN SOD (PORK) LOCK FLUSH 100 UNIT/ML IV SOLN
500.0000 [IU] | Freq: Once | INTRAVENOUS | Status: AC
  Administered 2022-05-01: 500 [IU] via INTRAVENOUS

## 2022-05-01 MED ORDER — SODIUM CHLORIDE 0.9% FLUSH
10.0000 mL | Freq: Once | INTRAVENOUS | Status: AC
  Administered 2022-05-01: 10 mL via INTRAVENOUS

## 2022-05-01 NOTE — Patient Instructions (Signed)

## 2022-07-07 ENCOUNTER — Other Ambulatory Visit: Payer: Self-pay | Admitting: Family Medicine

## 2022-07-07 DIAGNOSIS — I10 Essential (primary) hypertension: Secondary | ICD-10-CM

## 2022-08-12 ENCOUNTER — Ambulatory Visit (INDEPENDENT_AMBULATORY_CARE_PROVIDER_SITE_OTHER): Payer: 59 | Admitting: Family Medicine

## 2022-08-12 ENCOUNTER — Encounter: Payer: Self-pay | Admitting: Family Medicine

## 2022-08-12 VITALS — BP 118/86 | HR 75 | Temp 97.6°F | Ht 70.0 in | Wt 194.5 lb

## 2022-08-12 DIAGNOSIS — I10 Essential (primary) hypertension: Secondary | ICD-10-CM | POA: Diagnosis not present

## 2022-08-12 DIAGNOSIS — G629 Polyneuropathy, unspecified: Secondary | ICD-10-CM | POA: Diagnosis not present

## 2022-08-12 DIAGNOSIS — R7309 Other abnormal glucose: Secondary | ICD-10-CM

## 2022-08-12 DIAGNOSIS — D500 Iron deficiency anemia secondary to blood loss (chronic): Secondary | ICD-10-CM | POA: Diagnosis not present

## 2022-08-12 DIAGNOSIS — T887XXA Unspecified adverse effect of drug or medicament, initial encounter: Secondary | ICD-10-CM | POA: Insufficient documentation

## 2022-08-12 DIAGNOSIS — Z0000 Encounter for general adult medical examination without abnormal findings: Secondary | ICD-10-CM

## 2022-08-12 LAB — COMPREHENSIVE METABOLIC PANEL
ALT: 23 U/L (ref 0–53)
AST: 27 U/L (ref 0–37)
Albumin: 4.2 g/dL (ref 3.5–5.2)
Alkaline Phosphatase: 78 U/L (ref 39–117)
BUN: 17 mg/dL (ref 6–23)
CO2: 27 mEq/L (ref 19–32)
Calcium: 9.7 mg/dL (ref 8.4–10.5)
Chloride: 104 mEq/L (ref 96–112)
Creatinine, Ser: 1.08 mg/dL (ref 0.40–1.50)
GFR: 83.56 mL/min (ref >60.00)
Glucose, Bld: 138 mg/dL — ABNORMAL HIGH (ref 70–99)
Potassium: 3.8 mEq/L (ref 3.5–5.1)
Sodium: 139 mEq/L (ref 135–145)
Total Bilirubin: 0.4 mg/dL (ref 0.2–1.2)
Total Protein: 7 g/dL (ref 6.0–8.3)

## 2022-08-12 LAB — TSH: TSH: 2.26 u[IU]/mL (ref 0.35–5.50)

## 2022-08-12 LAB — CBC WITH DIFFERENTIAL/PLATELET
Basophils Absolute: 0 10*3/uL (ref 0.0–0.1)
Basophils Relative: 0.6 % (ref 0.0–3.0)
Eosinophils Absolute: 0.2 10*3/uL (ref 0.0–0.7)
Eosinophils Relative: 5.3 % — ABNORMAL HIGH (ref 0.0–5.0)
HCT: 41.5 % (ref 39.0–52.0)
Hemoglobin: 14.3 g/dL (ref 13.0–17.0)
Lymphocytes Relative: 26 % (ref 12.0–46.0)
Lymphs Abs: 0.8 10*3/uL (ref 0.7–4.0)
MCHC: 34.6 g/dL (ref 30.0–36.0)
MCV: 90.9 fl (ref 78.0–100.0)
Monocytes Absolute: 0.4 10*3/uL (ref 0.1–1.0)
Monocytes Relative: 10.9 % (ref 3.0–12.0)
Neutro Abs: 1.8 10*3/uL (ref 1.4–7.7)
Neutrophils Relative %: 57.2 % (ref 43.0–77.0)
Platelets: 138 10*3/uL — ABNORMAL LOW (ref 150.0–400.0)
RBC: 4.56 Mil/uL (ref 4.22–5.81)
RDW: 12.1 % (ref 11.5–15.5)
WBC: 3.2 10*3/uL — ABNORMAL LOW (ref 4.0–10.5)

## 2022-08-12 LAB — VITAMIN B12: Vitamin B-12: 263 pg/mL (ref 211–911)

## 2022-08-12 NOTE — Progress Notes (Addendum)
Established Patient Office Visit   Subjective:  Patient ID: Charles Rowland, male    DOB: 1976/03/02  Age: 46 y.o. MRN: [id number]  Chief Complaint  Patient presents with   Annual Exam    HPI Encounter Diagnoses  Name Primary?   Essential hypertension Yes   Healthcare maintenance    Anemia due to chronic blood loss    Medication side effect    Neuropathy    Elevated glucose    For yearly physical.  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  Status post surgery follow-up chemotherapy he is doing well.  he is experiencing tingling in his fingers and toes.  He is also taking omeprazole.  Exercises been somewhat difficult secondary.  He has regular dental care.  He continues to work full-time.   Review of Systems  Constitutional: Negative.   HENT: Negative.    Eyes:  Negative for blurred vision, discharge and redness.  Respiratory: Negative.    Cardiovascular: Negative.   Gastrointestinal:  Negative for abdominal pain.  Genitourinary: Negative.   Musculoskeletal: Negative.  Negative for myalgias.  Skin:  Negative for rash.  Neurological:  Positive for tingling. Negative for loss of consciousness and weakness.  Endo/Heme/Allergies:  Negative for polydipsia.      08/12/2022    9:31 AM 08/07/2021    9:54 AM 08/07/2021    9:13 AM  Depression screen PHQ 2/9  Decreased Interest 0 0 0  Down, Depressed, Hopeless 0 0 0  PHQ - 2 Score 0 0 0  Altered sleeping 0 1   Tired, decreased energy 0 1   Change in appetite 0 0   Feeling bad or failure about yourself  0 0   Trouble concentrating 0 0   Moving slowly or fidgety/restless 0 0   Suicidal thoughts 0 0   PHQ-9 Score 0 2   Difficult doing work/chores Not difficult at all Not difficult at all        Current Outpatient Medications:    amLODipine (NORVASC) 5 MG tablet, Take 5 mg by mouth daily., Disp: , Rfl:    dexamethasone (DECADRON) 4 MG tablet, Take 8 mg by mouth 2 (two) times daily. Starts on  day 2 for 2 days, Disp: , Rfl:    fluconazole (DIFLUCAN) 100 MG tablet, Take 1 tablet (100 mg total) by mouth daily. Take x 4 days, Disp: 4 tablet, Rfl: 0   gabapentin (NEURONTIN) 100 MG capsule, Take 100 mg by mouth 3 (three) times daily., Disp: , Rfl:    ibuprofen (ADVIL) 200 MG tablet, Take 400 mg by mouth every 8 (eight) hours as needed for moderate pain., Disp: , Rfl:    olmesartan (BENICAR) 20 MG tablet, TAKE 1 TABLET BY MOUTH EVERY DAY, Disp: 90 tablet, Rfl: 2   pantoprazole (PROTONIX) 40 MG tablet, Take 40 mg by mouth daily., Disp: , Rfl:    Objective:     BP 118/86   Pulse 75   Temp 97.6 F (36.4 C) (Oral)   Ht 5\' 10"  (1.778 m)   Wt 194 lb 8 oz (88.2 kg)   SpO2 98%   BMI 27.91 kg/m    Physical Exam Constitutional:      General: He is not in acute distress.    Appearance: Normal appearance. He is not ill-appearing, toxic-appearing or diaphoretic.  HENT:     Head: Normocephalic and atraumatic.     Right Ear: External ear normal.     Left Ear: External  ear normal.     Mouth/Throat:     Mouth: Mucous membranes are moist.     Pharynx: Oropharynx is clear. No oropharyngeal exudate or posterior oropharyngeal erythema.  Eyes:     General: No scleral icterus.       Right eye: No discharge.        Left eye: No discharge.     Extraocular Movements: Extraocular movements intact.     Conjunctiva/sclera: Conjunctivae normal.     Pupils: Pupils are equal, round, and reactive to light.  Cardiovascular:     Rate and Rhythm: Normal rate and regular rhythm.  Pulmonary:     Effort: Pulmonary effort is normal. No respiratory distress.     Breath sounds: Normal breath sounds.  Abdominal:     General: Bowel sounds are normal.     Tenderness: There is no abdominal tenderness. There is no guarding or rebound.     Hernia: No hernia is present. There is no hernia in the left inguinal area or right inguinal area.  Genitourinary:    Penis: Circumcised. No hypospadias, erythema,  tenderness, discharge, swelling or lesions.      Testes:        Right: Mass, tenderness or swelling not present.        Left: Mass, tenderness or swelling not present.     Epididymis:     Right: Not inflamed or enlarged.     Left: Not inflamed or enlarged.  Musculoskeletal:     Cervical back: No rigidity or tenderness.  Lymphadenopathy:     Lower Body: No right inguinal adenopathy. No left inguinal adenopathy.  Skin:    General: Skin is warm and dry.  Neurological:     Mental Status: He is alert and oriented to person, place, and time.  Psychiatric:        Mood and Affect: Mood normal.        Behavior: Behavior normal.      Results for orders placed or performed in visit on 08/12/22  Comprehensive metabolic panel  Result Value Ref Range   Sodium 139 135 - 145 mEq/L   Potassium 3.8 3.5 - 5.1 mEq/L   Chloride 104 96 - 112 mEq/L   CO2 27 19 - 32 mEq/L   Glucose, Bld 138 (H) 70 - 99 mg/dL   BUN 17 6 - 23 mg/dL   Creatinine, Ser 1.08 0.40 - 1.50 mg/dL   Total Bilirubin 0.4 0.2 - 1.2 mg/dL   Alkaline Phosphatase 78 39 - 117 U/L   AST 27 0 - 37 U/L   ALT 23 0 - 53 U/L   Total Protein 7.0 6.0 - 8.3 g/dL   Albumin 4.2 3.5 - 5.2 g/dL   GFR 83.56 >60.00 mL/min   Calcium 9.7 8.4 - 10.5 mg/dL  CBC with Differential/Platelet  Result Value Ref Range   WBC 3.2 (L) 4.0 - 10.5 K/uL   RBC 4.56 4.22 - 5.81 Mil/uL   Hemoglobin 14.3 13.0 - 17.0 g/dL   HCT 41.5 39.0 - 52.0 %   MCV 90.9 78.0 - 100.0 fl   MCHC 34.6 30.0 - 36.0 g/dL   RDW 12.1 11.5 - 15.5 %   Platelets 138.0 (L) 150.0 - 400.0 K/uL   Neutrophils Relative % 57.2 43.0 - 77.0 %   Lymphocytes Relative 26.0 12.0 - 46.0 %   Monocytes Relative 10.9 3.0 - 12.0 %   Eosinophils Relative 5.3 (H) 0.0 - 5.0 %   Basophils Relative 0.6 0.0 - 3.0 %  Neutro Abs 1.8 1.4 - 7.7 K/uL   Lymphs Abs 0.8 0.7 - 4.0 K/uL   Monocytes Absolute 0.4 0.1 - 1.0 K/uL   Eosinophils Absolute 0.2 0.0 - 0.7 K/uL   Basophils Absolute 0.0 0.0 - 0.1 K/uL   Vitamin B12  Result Value Ref Range   Vitamin B-12 263 211 - 911 pg/mL  Iron, TIBC and Ferritin Panel  Result Value Ref Range   Iron 74 50 - 180 mcg/dL   TIBC 345 250 - 425 mcg/dL (calc)   %SAT 21 20 - 48 % (calc)   Ferritin 41 38 - 380 ng/mL  TSH  Result Value Ref Range   TSH 2.26 0.35 - 5.50 uIU/mL  Urinalysis, Routine w reflex microscopic  Result Value Ref Range   Color, Urine YELLOW Yellow;Lt. Yellow;Straw;Dark Yellow;Amber;Green;Red;Brown   APPearance CLEAR Clear;Turbid;Slightly Cloudy;Cloudy   Specific Gravity, Urine >=1.030 (A) 1.000 - 1.030   pH 6.0 5.0 - 8.0   Total Protein, Urine NEGATIVE Negative   Urine Glucose NEGATIVE Negative   Ketones, ur NEGATIVE Negative   Bilirubin Urine NEGATIVE Negative   Hgb urine dipstick NEGATIVE Negative   Urobilinogen, UA 0.2 0.0 - 1.0   Leukocytes,Ua NEGATIVE Negative   Nitrite NEGATIVE Negative   WBC, UA none seen 0-2/hpf   RBC / HPF none seen 0-2/hpf   Mucus, UA Presence of (A) None      The 10-year ASCVD risk score (Arnett DK, et al., 2019) is: 1.6%    Assessment & Plan:   Essential hypertension -     Lipid panel; Future -     CBC with Differential/Platelet  Healthcare maintenance -     Lipid panel; Future -     Comprehensive metabolic panel -     CBC with Differential/Platelet -     Urinalysis, Routine w reflex microscopic  Anemia due to chronic blood loss -     CBC with Differential/Platelet -     Iron, TIBC and Ferritin Panel  Medication side effect -     Vitamin B12 -     Iron, TIBC and Ferritin Panel -     TSH  Neuropathy -     Vitamin B12 -     Iron, TIBC and Ferritin Panel -     TSH  Elevated glucose -     Hemoglobin A1c; Future    Return in about 1 year (around 08/12/2023), or if symptoms worsen or fail to improve.    Libby Maw, MD

## 2022-08-13 LAB — URINALYSIS, ROUTINE W REFLEX MICROSCOPIC
Bilirubin Urine: NEGATIVE
Hgb urine dipstick: NEGATIVE
Ketones, ur: NEGATIVE
Leukocytes,Ua: NEGATIVE
Nitrite: NEGATIVE
RBC / HPF: NONE SEEN (ref 0–?)
Specific Gravity, Urine: >=1.03 — AB (ref 1.000–1.030)
Total Protein, Urine: NEGATIVE
Urine Glucose: NEGATIVE
Urobilinogen, UA: 0.2 (ref 0.0–1.0)
WBC, UA: NONE SEEN (ref 0–?)
pH: 6 (ref 5.0–8.0)

## 2022-08-13 LAB — IRON,TIBC AND FERRITIN PANEL
%SAT: 21 % (calc) (ref 20–48)
Ferritin: 41 ng/mL (ref 38–380)
Iron: 74 ug/dL (ref 50–180)
TIBC: 345 mcg/dL (calc) (ref 250–425)

## 2022-08-19 NOTE — Addendum Note (Signed)
Addended by: Jon Billings on: 08/19/2022 12:18 PM   Modules accepted: Orders

## 2022-09-12 ENCOUNTER — Encounter: Payer: Self-pay | Admitting: Family Medicine

## 2022-09-12 ENCOUNTER — Ambulatory Visit (INDEPENDENT_AMBULATORY_CARE_PROVIDER_SITE_OTHER): Payer: 59 | Admitting: Family Medicine

## 2022-09-12 VITALS — BP 122/80 | HR 72 | Temp 98.0°F | Resp 18 | Ht 70.0 in | Wt 195.0 lb

## 2022-09-12 DIAGNOSIS — G629 Polyneuropathy, unspecified: Secondary | ICD-10-CM | POA: Diagnosis not present

## 2022-09-12 DIAGNOSIS — B353 Tinea pedis: Secondary | ICD-10-CM | POA: Diagnosis not present

## 2022-09-12 DIAGNOSIS — Z13220 Encounter for screening for lipoid disorders: Secondary | ICD-10-CM

## 2022-09-12 DIAGNOSIS — R7309 Other abnormal glucose: Secondary | ICD-10-CM

## 2022-09-12 DIAGNOSIS — L304 Erythema intertrigo: Secondary | ICD-10-CM | POA: Insufficient documentation

## 2022-09-12 MED ORDER — FLUCONAZOLE 150 MG PO TABS: ORAL_TABLET | ORAL | 0 refills | Status: DC

## 2022-09-12 MED ORDER — KETOCONAZOLE 2 % EX CREA: TOPICAL_CREAM | CUTANEOUS | 0 refills | Status: AC

## 2022-09-12 NOTE — Progress Notes (Signed)
Established Patient Office Visit   Subjective:  Patient ID: Charles Rowland, male    DOB: June 04, 1975  Age: 47 y.o. MRN: [id number]  Chief Complaint  Patient presents with   peeling feet    More on the L foot, started earlier this week. More near the 5th toe. He does have pain. He has had some irritation behind the ear lobes as well- he is unsure if this is related or not. 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     HPI Encounter Diagnoses  Name Primary?   Tinea pedis of left foot Yes   Screening cholesterol level    Elevated glucose    Neuropathy    Intertrigo    1 week history of a evolving sore area between his left fifth and fourth toes.  He also has a tender rash behind both ears.  He is taking a B vitamin supplement.  He is fasting today.  There is no family history of diabetes.   Review of Systems  Constitutional: Negative.   HENT: Negative.    Eyes:  Negative for blurred vision, discharge and redness.  Respiratory: Negative.    Cardiovascular: Negative.   Gastrointestinal:  Negative for abdominal pain.  Genitourinary: Negative.   Musculoskeletal: Negative.  Negative for myalgias.  Skin:  Positive for rash.  Neurological:  Negative for tingling, loss of consciousness and weakness.  Endo/Heme/Allergies:  Negative for polydipsia.     Current Outpatient Medications:    amLODipine (NORVASC) 5 MG tablet, Take 5 mg by mouth daily., Disp: , Rfl:    dexamethasone (DECADRON) 4 MG tablet, Take 8 mg by mouth 2 (two) times daily. Starts on day 2 for 2 days, Disp: , Rfl:    fluconazole (DIFLUCAN) 150 MG tablet, Take 1 tablet today and repeat once in a week., Disp: 2 tablet, Rfl: 0   gabapentin (NEURONTIN) 100 MG capsule, Take 100 mg by mouth 3 (three) times daily., Disp: , Rfl:    ibuprofen (ADVIL) 200 MG tablet, Take 400 mg by mouth every 8 (eight) hours as needed for moderate pain., Disp: , Rfl:    ketoconazole (NIZORAL) 2 % cream, Apply a thin coat to rash once daily for  14 days., Disp: 30 g, Rfl: 0   olmesartan (BENICAR) 20 MG tablet, TAKE 1 TABLET BY MOUTH EVERY DAY, Disp: 90 tablet, Rfl: 2   pantoprazole (PROTONIX) 40 MG tablet, Take 40 mg by mouth daily., Disp: , Rfl:    Objective:     BP 122/80 (BP Location: Left Arm, Patient Position: Sitting, Cuff Size: Normal)   Pulse 72   Temp 98 F (36.7 C) (Oral)   Resp 18   Ht 5\' 10"  (1.778 m)   Wt 195 lb (88.5 kg)   SpO2 98%   BMI 27.98 kg/m    Physical Exam Constitutional:      General: He is not in acute distress.    Appearance: Normal appearance. He is not ill-appearing, toxic-appearing or diaphoretic.  HENT:     Head: Normocephalic and atraumatic.     Right Ear: External ear normal.     Left Ear: External ear normal.  Eyes:     General: No scleral icterus.       Right eye: No discharge.        Left eye: No discharge.     Extraocular Movements: Extraocular movements intact.     Conjunctiva/sclera: Conjunctivae normal.  Pulmonary:     Effort: Pulmonary effort is normal. No respiratory  distress.  Skin:    General: Skin is warm and dry.       Neurological:     Mental Status: He is alert and oriented to person, place, and time.  Psychiatric:        Mood and Affect: Mood normal.        Behavior: Behavior normal.      No results found for any visits on 09/12/22.    The 10-year ASCVD risk score (Arnett DK, et al., 2019) is: 1.7%    Assessment & Plan:   Tinea pedis of left foot -     Fluconazole; Take 1 tablet today and repeat once in a week.  Dispense: 2 tablet; Refill: 0 -     Ketoconazole; Apply a thin coat to rash once daily for 14 days.  Dispense: 30 g; Refill: 0  Screening cholesterol level -     Lipid panel  Elevated glucose -     Hemoglobin A1c  Neuropathy  Intertrigo -     Fluconazole; Take 1 tablet today and repeat once in a week.  Dispense: 2 tablet; Refill: 0 -     Ketoconazole; Apply a thin coat to rash once daily for 14 days.  Dispense: 30 g; Refill:  0    Return if symptoms worsen or fail to improve.    Mliss Sax, MD

## 2022-09-13 LAB — LIPID PANEL
Cholesterol: 166 mg/dL (ref 0–200)
HDL: 40.3 mg/dL (ref >39.00)
LDL Cholesterol: 111 mg/dL — ABNORMAL HIGH (ref 0–99)
NonHDL: 126.11
Total CHOL/HDL Ratio: 4
Triglycerides: 75 mg/dL (ref 0.0–149.0)
VLDL: 15 mg/dL (ref 0.0–40.0)

## 2022-09-13 LAB — HEMOGLOBIN A1C: Hgb A1c MFr Bld: 5.4 % (ref 4.6–6.5)

## 2022-09-25 ENCOUNTER — Encounter: Payer: Self-pay | Admitting: Family Medicine

## 2022-09-25 ENCOUNTER — Ambulatory Visit (INDEPENDENT_AMBULATORY_CARE_PROVIDER_SITE_OTHER): Payer: 59 | Admitting: Family Medicine

## 2022-09-25 ENCOUNTER — Telehealth: Payer: Self-pay | Admitting: Family Medicine

## 2022-09-25 VITALS — BP 118/80 | HR 77 | Temp 97.7°F | Ht 70.0 in | Wt 193.9 lb

## 2022-09-25 DIAGNOSIS — R040 Epistaxis: Secondary | ICD-10-CM

## 2022-09-25 LAB — CBC WITH DIFFERENTIAL/PLATELET
Basophils Absolute: 0 K/uL (ref 0.0–0.1)
Basophils Relative: 0.9 % (ref 0.0–3.0)
Eosinophils Absolute: 0.2 K/uL (ref 0.0–0.7)
Eosinophils Relative: 3.3 % (ref 0.0–5.0)
HCT: 38.2 % — ABNORMAL LOW (ref 39.0–52.0)
Hemoglobin: 13.3 g/dL (ref 13.0–17.0)
Lymphocytes Relative: 20.3 % (ref 12.0–46.0)
Lymphs Abs: 1.1 K/uL (ref 0.7–4.0)
MCHC: 34.9 g/dL (ref 30.0–36.0)
MCV: 89.3 fl (ref 78.0–100.0)
Monocytes Absolute: 0.5 K/uL (ref 0.1–1.0)
Monocytes Relative: 8.4 % (ref 3.0–12.0)
Neutro Abs: 3.8 K/uL (ref 1.4–7.7)
Neutrophils Relative %: 67.1 % (ref 43.0–77.0)
Platelets: 201 K/uL (ref 150.0–400.0)
RBC: 4.28 Mil/uL (ref 4.22–5.81)
RDW: 12.3 % (ref 11.5–15.5)
WBC: 5.6 K/uL (ref 4.0–10.5)

## 2022-09-25 LAB — APTT: aPTT: 30.9 s (ref 25.4–36.8)

## 2022-09-25 NOTE — Telephone Encounter (Signed)
Pt wife called and said her husband nose been bleeding and nose clot for several days . Charles Rowland transferred the pt to triage nurse and the patient was on hold for a while so she called our office back. The pt wife explain to me about her husband and I schedule him to see dr Charles Rowland today at 9.00

## 2022-09-25 NOTE — Telephone Encounter (Signed)
Wife called about this pt having nosebleeds for a few days and has turned to blood clots. I was attempting to send to NT but she hung up called back and an appt was made.

## 2022-09-25 NOTE — Telephone Encounter (Signed)
FYI message below appointment scheduled.

## 2022-09-25 NOTE — Progress Notes (Signed)
Established Patient Office Visit   Subjective:  Patient ID: Charles Rowland, male    DOB: Jul 20, 1975  Age: 47 y.o. MRN: [id number]  Chief Complaint  Patient presents with   Epistaxis    HPI Encounter Diagnoses  Name Primary?   Epistaxis Yes   1 to 2-week history of intermittent almost daily epistaxis from the left nostril.  Experiencing seasonal nasal congestion with rhinorrhea and postnasal drip.  Used a nasal steroid 1 day and stopped.  Has been using a nasal decongestant strictly to control the bleeding.  Has otherwise been trying nasal saline.  No recent chemotherapy.  He has not been taking aspirin or nonsteroidals.  No illicit drug use.   Review of Systems  Constitutional: Negative.   HENT:  Positive for congestion and nosebleeds. Negative for sinus pain and sore throat.   Eyes:  Negative for blurred vision, discharge and redness.  Respiratory: Negative.    Cardiovascular: Negative.   Gastrointestinal:  Negative for abdominal pain.  Genitourinary: Negative.   Musculoskeletal: Negative.  Negative for myalgias.  Skin:  Negative for rash.  Neurological:  Negative for tingling, loss of consciousness and weakness.  Endo/Heme/Allergies:  Negative for polydipsia.     Current Outpatient Medications:    amLODipine (NORVASC) 5 MG tablet, Take 5 mg by mouth daily., Disp: , Rfl:    dexamethasone (DECADRON) 4 MG tablet, Take 8 mg by mouth 2 (two) times daily. Starts on day 2 for 2 days, Disp: , Rfl:    gabapentin (NEURONTIN) 100 MG capsule, Take 100 mg by mouth 3 (three) times daily., Disp: , Rfl:    ibuprofen (ADVIL) 200 MG tablet, Take 400 mg by mouth every 8 (eight) hours as needed for moderate pain., Disp: , Rfl:    ketoconazole (NIZORAL) 2 % cream, Apply a thin coat to rash once daily for 14 days., Disp: 30 g, Rfl: 0   olmesartan (BENICAR) 20 MG tablet, TAKE 1 TABLET BY MOUTH EVERY DAY, Disp: 90 tablet, Rfl: 2   pantoprazole (PROTONIX) 40 MG tablet, Take 40 mg by mouth daily.,  Disp: , Rfl:    fluconazole (DIFLUCAN) 150 MG tablet, Take 1 tablet today and repeat once in a week. (Patient not taking: Reported on 09/25/2022), Disp: 2 tablet, Rfl: 0   Objective:     BP 118/80   Pulse 77   Temp 97.7 F (36.5 C)   Ht  (1.778 m)   Wt 193 lb 14.4 oz (88 kg)   SpO2 99%   BMI 27.82 kg/m    Physical Exam Constitutional:      General: He is not in acute distress.    Appearance: Normal appearance. He is not ill-appearing, toxic-appearing or diaphoretic.  HENT:     Head: Normocephalic and atraumatic.     Right Ear: Tympanic membrane, ear canal and external ear normal.     Left Ear: Tympanic membrane, ear canal and external ear normal.     Nose:     Right Nostril: No septal hematoma or occlusion.     Left Nostril: Epistaxis present. No foreign body or occlusion.      Mouth/Throat:     Mouth: Mucous membranes are moist.     Pharynx: Oropharynx is clear. No oropharyngeal exudate or posterior oropharyngeal erythema.  Eyes:     General: No scleral icterus.       Right eye: No discharge.        Left eye: No discharge.     Extraocular Movements: Extraocular  movements intact.     Conjunctiva/sclera: Conjunctivae normal.     Pupils: Pupils are equal, round, and reactive to light.  Pulmonary:     Effort: Pulmonary effort is normal. No respiratory distress.  Skin:    General: Skin is warm and dry.  Neurological:     Mental Status: He is alert and oriented to person, place, and time.  Psychiatric:        Mood and Affect: Mood normal.        Behavior: Behavior normal.      No results found for any visits on 09/25/22.    The 10-year ASCVD risk score (Arnett DK, et al., 2019) is: 1.7%    Assessment & Plan:   Epistaxis -     CBC with Differential/Platelet -     Protime-INR; Future -     APTT    No follow-ups on file.  Will hold nasal steroids and use saline nose spray exclusively.  Discussed local packing.  May use Vaseline.  Could consider ENT  referral if bleeding persists.  Mliss Sax, MD

## 2022-10-02 ENCOUNTER — Other Ambulatory Visit: Payer: Self-pay

## 2022-10-02 ENCOUNTER — Encounter (HOSPITAL_BASED_OUTPATIENT_CLINIC_OR_DEPARTMENT_OTHER): Payer: Self-pay | Admitting: Emergency Medicine

## 2022-10-02 ENCOUNTER — Emergency Department (HOSPITAL_BASED_OUTPATIENT_CLINIC_OR_DEPARTMENT_OTHER)
Admission: EM | Admit: 2022-10-02 | Discharge: 2022-10-02 | Disposition: A | Discharge status: Home / Self Care | Payer: 59 | Attending: Emergency Medicine | Admitting: Emergency Medicine

## 2022-10-02 DIAGNOSIS — I10 Essential (primary) hypertension: Secondary | ICD-10-CM | POA: Insufficient documentation

## 2022-10-02 DIAGNOSIS — Z85038 Personal history of other malignant neoplasm of large intestine: Secondary | ICD-10-CM | POA: Insufficient documentation

## 2022-10-02 DIAGNOSIS — R040 Epistaxis: Secondary | ICD-10-CM

## 2022-10-02 LAB — CBC WITH DIFFERENTIAL/PLATELET
Abs Immature Granulocytes: 0.01 10*3/uL (ref 0.00–0.07)
Basophils Absolute: 0 10*3/uL (ref 0.0–0.1)
Basophils Relative: 0 %
Eosinophils Absolute: 0.1 10*3/uL (ref 0.0–0.5)
Eosinophils Relative: 1 %
HCT: 37.3 % — ABNORMAL LOW (ref 39.0–52.0)
Hemoglobin: 12.7 g/dL — ABNORMAL LOW (ref 13.0–17.0)
Immature Granulocytes: 0 %
Lymphocytes Relative: 22 %
Lymphs Abs: 1.2 10*3/uL (ref 0.7–4.0)
MCH: 30.5 pg (ref 26.0–34.0)
MCHC: 34 g/dL (ref 30.0–36.0)
MCV: 89.7 fL (ref 80.0–100.0)
Monocytes Absolute: 0.5 10*3/uL (ref 0.1–1.0)
Monocytes Relative: 10 %
Neutro Abs: 3.6 10*3/uL (ref 1.7–7.7)
Neutrophils Relative %: 67 %
Platelets: 157 10*3/uL (ref 150–400)
RBC: 4.16 MIL/uL — ABNORMAL LOW (ref 4.22–5.81)
RDW: 12.4 % (ref 11.5–15.5)
WBC: 5.4 10*3/uL (ref 4.0–10.5)
nRBC: 0 % (ref 0.0–0.2)

## 2022-10-02 MED ORDER — SILVER NITRATE-POT NITRATE 75-25 % EX MISC
1.0000 | Freq: Once | CUTANEOUS | Status: AC
  Administered 2022-10-02: 1 via TOPICAL
  Filled 2022-10-02: qty 10

## 2022-10-02 MED ORDER — LIDOCAINE-EPINEPHRINE (PF) 2 %-1:200000 IJ SOLN
10.0000 mL | Freq: Once | INTRAMUSCULAR | Status: AC
  Administered 2022-10-02: 10 mL
  Filled 2022-10-02: qty 20

## 2022-10-02 NOTE — Discharge Instructions (Signed)

## 2022-10-02 NOTE — ED Provider Notes (Signed)
Emergency Department Provider Note   I have reviewed the triage vital signs and the nursing notes.   HISTORY  Chief Complaint Epistaxis   HPI Charles Rowland is a 47 y.o. male with past history of hypertension and colon cancer in 2019, not actively on chemotherapy, presents to the emergency department with continued epistaxis from the left nostril.  Symptoms been going on for the past 5 to 6 weeks intermittently.  He seen his primary care doctor he apparently did silver nitrate in the office but continues to have bleeding.  No active bleeding at this time although some heavier epistaxis this morning.  He is feeling some fatigue.  No injury.  He has been using saline spray without relief and holding pressure when bleeding starts for 1-2 minutes at a time. He is not anticoagulated.    Past Medical History:  Diagnosis Date   Cancer [facility])    Colon Dx 2019   Family history of breast cancer    Hypertension     Review of Systems  Constitutional: No fever/chills ENT: No sore throat. Epistaxis from the left nostril.  Cardiovascular: Denies chest pain. Respiratory: Denies shortness of breath. Gastrointestinal: No abdominal pain.  Musculoskeletal: Negative for back pain. Skin: Negative for rash. Neurological: Negative for headaches.  ____________________________________________   PHYSICAL EXAM:  VITAL SIGNS: ED Triage Vitals  Enc Vitals Group     BP 10/02/22 1454 (!) 148/103     Pulse Rate 10/02/22 1454 98     Resp 10/02/22 1454 16     Temp 10/02/22 1454 97.8 F (36.6 C)     Temp Source 10/02/22 1545 Oral     SpO2 10/02/22 1454 100 %     Weight 10/02/22 1502 195 lb (88.5 kg)     Height 10/02/22 1502 5\' 10"  (1.778 m)   Constitutional: Alert and oriented. Well appearing and in no acute distress. Eyes: Conjunctivae are normal. Head: Atraumatic. Nose: No congestion/rhinnorhea.  Patient with point eschar to the left anterior nasal septum. No active bleeding. No other  conspicuous area of recent bleeding visualized.  Mouth/Throat: Mucous membranes are moist.  Neck: No stridor.  Cardiovascular: Normal rate, regular rhythm. Good peripheral circulation. Grossly normal heart sounds.   Respiratory: Normal respiratory effort.  No retractions. Lungs CTAB. Gastrointestinal: No distention.  Musculoskeletal: No gross deformities of extremities. Neurologic:  Normal speech and language.  Skin:  Skin is warm, dry and intact. No rash noted.  ____________________________________________   LABS (all labs ordered are listed, but only abnormal results are displayed)  Labs Reviewed  CBC WITH DIFFERENTIAL/PLATELET - Abnormal; Notable for the following components:      Result Value   RBC 4.16 (*)    Hemoglobin 12.7 (*)    HCT 37.3 (*)    All other components within normal limits   ____________________________________________   PROCEDURES  Procedure(s) performed:   .Epistaxis Management  Date/Time: 10/02/2022 5:16 PM  Performed by: Maia Plan, MD Authorized by: Maia Plan, MD   Consent:    Consent obtained:  Verbal   Consent given by:  Patient   Risks, benefits, and alternatives were discussed: yes     Risks discussed:  Infection, nasal injury, bleeding and pain   Alternatives discussed:  Delayed treatment Universal protocol:    Patient identity confirmed:  Verbally with patient Anesthesia:    Anesthesia method:  Topical application   Topical anesthesia: Topical lidocaine with Epi. Procedure details:    Treatment site:  L anterior  Treatment method:  Silver nitrate   Treatment complexity:  Limited   Treatment episode: recurring   Post-procedure details:    Assessment:  Bleeding stopped   Procedure completion:  Tolerated well, no immediate complications    ____________________________________________   INITIAL IMPRESSION / ASSESSMENT AND PLAN / ED COURSE  Pertinent labs & imaging results that were available during my care of the  patient were reviewed by me and considered in my medical decision making (see chart for details).   This patient is Presenting for Evaluation of epistaxis, which does require a range of treatment options, and is a complaint that involves a moderate risk of morbidity and mortality.  The Differential Diagnoses include anterior epistaxis, posterior epistaxis, coagulopathy, symptomatic anemia, nasal injury, etc.  Critical Interventions-    Medications  lidocaine-EPINEPHrine (XYLOCAINE W/EPI) 2 %-1:200000 (PF) injection 10 mL (10 mLs Infiltration Given 10/02/22 1640)  silver nitrate applicators applicator 1 Application (1 Application Topical Given 10/02/22 1641)    Reassessment after intervention: bleeding stopped.    I decided to review pertinent External Data, and in summary patient seen by PCP for epistaxis in the recent past.   Clinical Laboratory Tests Ordered, included CBC with only mild anemia at 12.7.  Medical Decision Making: Summary:  Patient presents to the emergency department with continued epistaxis from the left nostril.  No active bleeding at this time but I do visualize some anterior eschar along the nasal septum.  After lidocaine/epinephrine I was able to apply silver nitrate and cauterized the area.  Plan to observe.  Patient with mild anemia but not to the point requiring blood transfusion.   Reevaluation with update and discussion with patient. No re-bleeding on reassessment. Site of cauterization looks good. Plan for d/c with epistaxis mgmt plan and ENT follow up if symptoms continue.    Patient's presentation is most consistent with acute, uncomplicated illness.   Disposition: discharge  ____________________________________________  FINAL CLINICAL IMPRESSION(S) / ED DIAGNOSES  Final diagnoses:  Anterior epistaxis    Note:  This document was prepared using Dragon voice recognition software and may include unintentional dictation errors.  Alona Bene, MD,  [facility] Emergency Medicine    Taria Castrillo, Arlyss Repress, MD 10/02/22 [phone]

## 2022-10-02 NOTE — ED Triage Notes (Signed)
Pt arrived POV, caox4, ambulatory, NAD c/o frequent epistaxis over the past 1.5 months after being on chemo. Pt states he has had them even more frequent over the past few days and has had 7 since Sunday. Pt was tx by PCP last week for same but advised him to go to ED today d/t the increase. Pt does not take blood thinner med. No active bleeding at present.

## 2022-12-12 NOTE — Progress Notes (Signed)
[facility] PRIMARY [facility]ARE [facility] [address] [place] [address] Dept: [phone] Dept Fax: [phone]  Acute Care Office Visit  Subjective:   Charles Rowland 12/06/1975 12/13/2022  Chief Complaint  Patient presents with   Nail Problem    Started 3 week ago,  inflammation on left thumb    HPI: Discussed the use of AI scribe software for clinical note transcription with the patient, who gave verbal consent to proceed.  History of Present Illness   The patient presents with a nail condition that started in mid-May with significant irritation or infection, including pus. He applied a warm towel, which helped relieve the pus without applying pressure. A few days later, the skin around the nail was very irritated. He was prescribed Keflex by an ENT, which helped with the skin irritation. However, for the past three to four weeks, the patient has noticed changes in the fingernail. The skin around the nail is no longer sensitive or irritable, but the nail itself is being affected. The patient reports no pain, but is concerned about the nail's appearance and the possibility of the old nail falling off. He also mentions a recent inflammation in his thumb, which was painful to touch but has since resolved. The patient admits to a history of nail biting but has stopped since the onset of the nail condition.        The following portions of the patient's history were reviewed and updated as appropriate: past medical history, past surgical history, family history, social history, allergies, medications, and problem list.   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/2021   History of malignant neoplasm of colon 08/07/2021   GAD (generalized anxiety disorder) 01/19/2019   Essential hypertension 12/29/2018   History of herniated intervertebral disc 06/07/2018   Screening cholesterol level 05/21/2018   Family history of breast cancer    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   Rectal bleed 04/05/2018   Bright red blood per rectum 04/04/2018   Lower GI bleed 04/04/2018   Past Medical History:  Diagnosis Date   Cancer [facility])    Colon Dx 2019   Family history of breast cancer    Hypertension    Past Surgical History:  Procedure Laterality Date   BIOPSY  04/06/2018   Procedure: BIOPSY;  Surgeon: Kerin Salen, MD;  Location: [facility];  Service: Gastroenterology;;   COLONOSCOPY WITH PROPOFOL N/A 04/06/2018   Procedure: COLONOSCOPY WITH PROPOFOL;  Surgeon: Kerin Salen, MD;  Location: [facility] ENDOSCOPY;  Service: Gastroenterology;  Laterality: N/A;   IR IMAGING GUIDED PORT INSERTION  01/17/2022   LAPAROSCOPIC SMALL BOWEL RESECTION N/A 04/08/2018   Procedure: LAPAROSCOPIC ASSISTED LOW ANTERIOR RESECTION;  Surgeon: Griselda Miner, MD;  Location: [facility] ORS;  Service: General;  Laterality: N/A;   POLYPECTOMY  04/06/2018   Procedure: POLYPECTOMY;  Surgeon: Kerin Salen, MD;  Location: [facility] ENDOSCOPY;  Service: Gastroenterology;;   SUBMUCOSAL INJECTION  04/06/2018   Procedure: SUBMUCOSAL INJECTION;  Surgeon: Kerin Salen, MD;  Location: [facility] ENDOSCOPY;  Service: Gastroenterology;;   Family History  Problem Relation Age of Onset   Hypertension Mother  Hypertension Father    Kidney disease Maternal Aunt    Breast cancer Maternal Grandmother        d. 49   Cancer Maternal Grandfather        lung, smoker   Lung cancer Other    CAD Neg Hx    Stroke Neg Hx    Outpatient Medications Prior to Visit  Medication Sig Dispense Refill   amLODipine (NORVASC) 5 MG tablet Take 5 mg by mouth daily.      dexamethasone (DECADRON) 4 MG tablet Take 8 mg by mouth 2 (two) times daily. Starts on day 2 for 2 days     gabapentin (NEURONTIN) 100 MG capsule Take 100 mg by mouth 3 (three) times daily.     olmesartan (BENICAR) 20 MG tablet TAKE 1 TABLET BY MOUTH EVERY DAY 90 tablet 2   pantoprazole (PROTONIX) 40 MG tablet Take 40 mg by mouth daily.     ketoconazole (NIZORAL) 2 % cream Apply a thin coat to rash once daily for 14 days. (Patient not taking: Reported on 12/13/2022) 30 g 0   fluconazole (DIFLUCAN) 150 MG tablet Take 1 tablet today and repeat once in a week. (Patient not taking: Reported on 09/25/2022) 2 tablet 0   ibuprofen (ADVIL) 200 MG tablet Take 400 mg by mouth every 8 (eight) hours as needed for moderate pain.     No facility-administered medications prior to visit.   No Known Allergies   ROS: A complete ROS was performed with pertinent positives/negatives noted in the HPI. The remainder of the ROS are negative.    Objective:   Today's Vitals   12/13/22 0840  BP: 112/80  Pulse: 70  Temp: 98.3 F (36.8 C)  TempSrc: Temporal  SpO2: 97%  Weight: 191 lb 3.2 oz (86.7 kg)  Height: 5\' 10"  (1.778 m)    GENERAL: Well-appearing, in NAD. Well nourished.  SKIN: Pink, warm and dry. New nail growth to based of right 3rd nail bed. Old nail outgrowing. No redness, swelling, or drainage. RESPIRATORY: Chest wall symmetrical. Respirations even and non-labored. CARDIAC:  Peripheral pulses 2+ bilaterally.  EXTREMITIES: Without clubbing, cyanosis, or edema.  NEUROLOGIC: Steady, even gait.  PSYCH/MENTAL STATUS: Alert, oriented x 3. Cooperative, appropriate mood and affect.    No results found for any visits on 12/13/22.    Assessment & Plan:  Assessment and Plan    Paronychia and nail injury: History of significant infection in May with pus and cellulitis, treated with Keflex. Current presentation shows no signs of infection, but nail damage is evident. New nail growth observed. No pain  or sensitivity reported. -Advise to keep nail short and avoid snagging or ripping. -If old nail falls off before new one fully grows, consider using a bandage if skin is sensitive. -Monitor for signs of infection: redness, drainage, heat. -Expect full nail growth to take several months to a year.      No orders of the defined types were placed in this encounter.  Lab Orders  No laboratory test(s) ordered today   No images are attached to the encounter or orders placed in the encounter.  Return for Scheduled Routine Office Visits and as needed.   Of note, portions of this note may have been created with voice recognition software Physicist, medical). While this note has been edited for accuracy, occasional wrong-word or 'sound-a-like' substitutions may have occurred due to the inherent limitations of voice recognition software.  Salvatore Decent, FNP

## 2022-12-13 ENCOUNTER — Ambulatory Visit (INDEPENDENT_AMBULATORY_CARE_PROVIDER_SITE_OTHER): Payer: 59 | Admitting: Internal Medicine

## 2022-12-13 ENCOUNTER — Encounter: Payer: Self-pay | Admitting: Internal Medicine

## 2022-12-13 VITALS — BP 112/80 | HR 70 | Temp 98.3°F | Ht 70.0 in | Wt 191.2 lb

## 2022-12-13 DIAGNOSIS — S6991XA Unspecified injury of right wrist, hand and finger(s), initial encounter: Secondary | ICD-10-CM

## 2022-12-13 DIAGNOSIS — L03011 Cellulitis of right finger: Secondary | ICD-10-CM

## 2022-12-27 ENCOUNTER — Ambulatory Visit: Payer: 59 | Admitting: Family Medicine

## 2023-04-22 ENCOUNTER — Other Ambulatory Visit: Payer: Self-pay | Admitting: Family Medicine

## 2023-04-22 DIAGNOSIS — I10 Essential (primary) hypertension: Secondary | ICD-10-CM

## 2023-06-13 ENCOUNTER — Telehealth: Payer: Self-pay | Admitting: Family Medicine

## 2023-06-13 NOTE — Telephone Encounter (Signed)
 error

## 2023-06-18 ENCOUNTER — Telehealth: Payer: Self-pay | Admitting: Family Medicine

## 2023-06-18 NOTE — Telephone Encounter (Signed)
 ERROR

## 2023-06-24 ENCOUNTER — Telehealth: Payer: Self-pay | Admitting: Family Medicine

## 2023-06-24 NOTE — Telephone Encounter (Signed)
 error

## 2023-08-04 ENCOUNTER — Inpatient Hospital Stay: Payer: 59 | Attending: Nurse Practitioner

## 2023-08-04 DIAGNOSIS — Z452 Encounter for adjustment and management of vascular access device: Secondary | ICD-10-CM | POA: Diagnosis present

## 2023-08-04 DIAGNOSIS — C787 Secondary malignant neoplasm of liver and intrahepatic bile duct: Secondary | ICD-10-CM | POA: Diagnosis not present

## 2023-08-04 DIAGNOSIS — C187 Malignant neoplasm of sigmoid colon: Secondary | ICD-10-CM | POA: Diagnosis present

## 2023-08-14 ENCOUNTER — Encounter: Payer: 59 | Admitting: Family Medicine

## 2023-11-29 IMAGING — CT CT CHEST-ABD-PELV W/ CM
2 of 5 series · 12 of 36 positions shown, 14 images · IV contrast (APPLIED)
Comparison: CT chest, abdomen, pelvis 04/06/2018, ultrasound
abdomen 11/16/2021

CLINICAL DATA: Right upper quadrant pain. Polytrauma, blunt
Lymphadenopathy, chest or axilla. History of colon cancer.

EXAM:
CT CHEST, ABDOMEN, AND PELVIS WITH CONTRAST
TECHNIQUE: Multidetector CT imaging of the chest, abdomen and pelvis was
performed following the standard protocol during bolus
administration of intravenous contrast.

[Series 2: cap with · axial · 0.93mm/px · z∈[+1468,+2052]mm · 9 of 147 slices shown, 11 images]
[im 15/147  mediastinal]
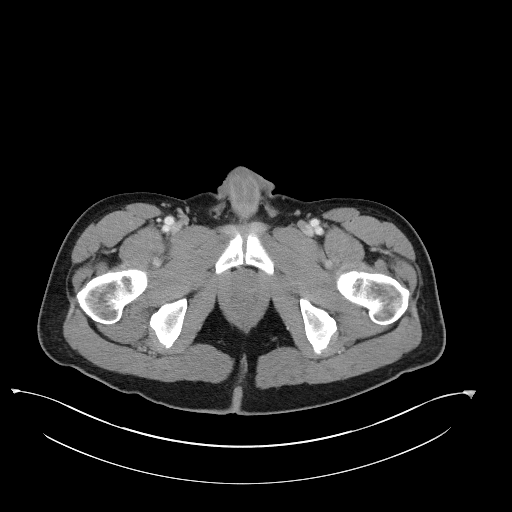
[im 15/147  bone]
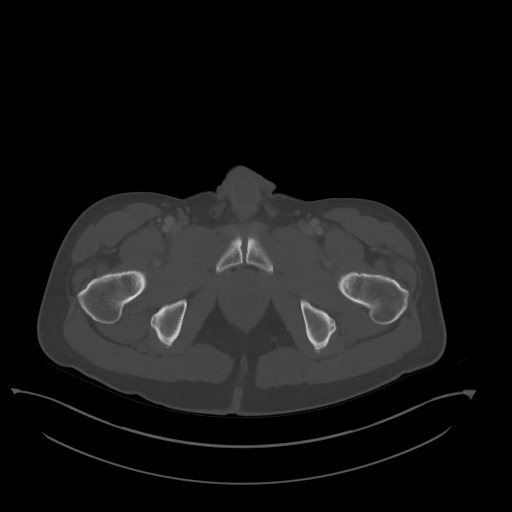
[im 30/147  mediastinal]
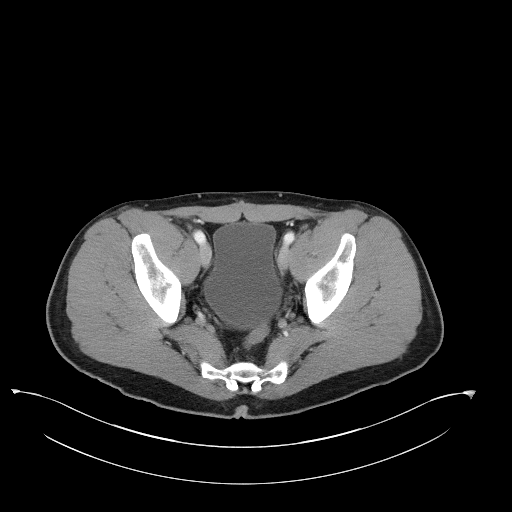
[im 44/147  mediastinal]
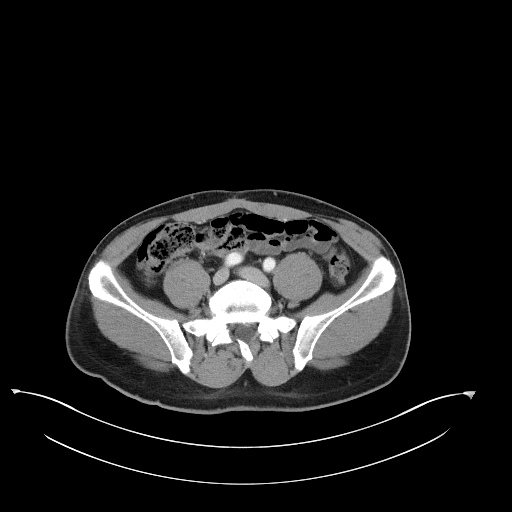
[im 59/147  mediastinal]
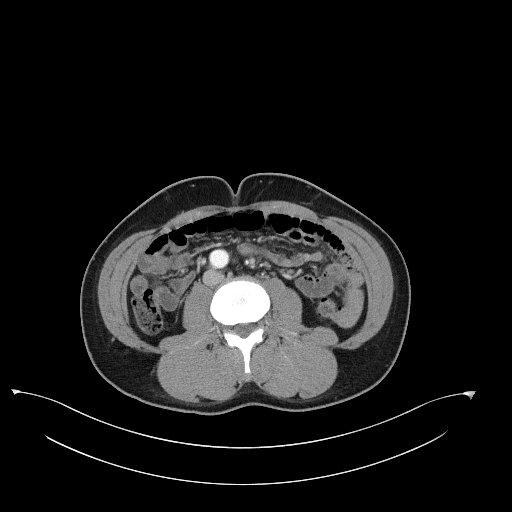
[im 74/147  mediastinal]
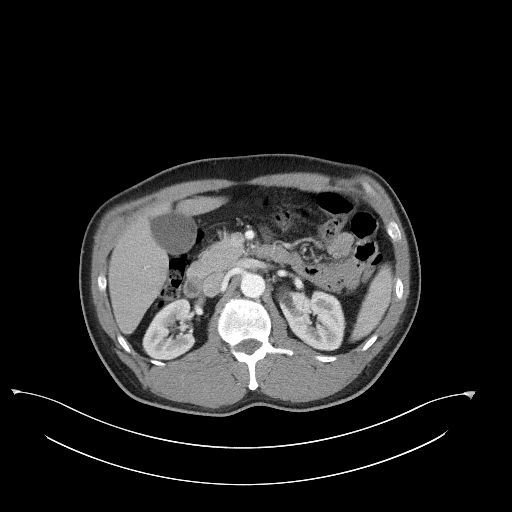
[im 88/147  mediastinal]
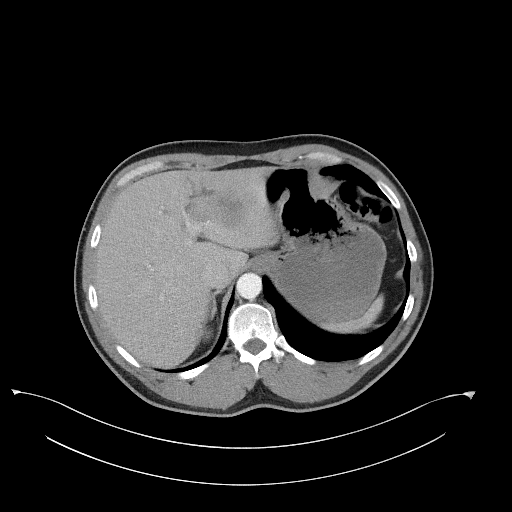
[im 103/147  mediastinal]
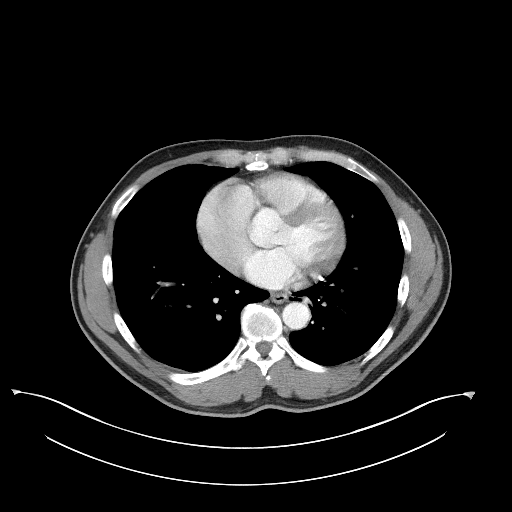
[im 117/147  mediastinal]
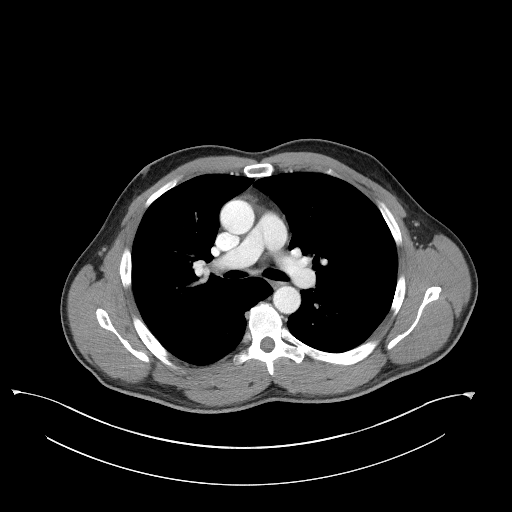
[im 132/147  mediastinal]
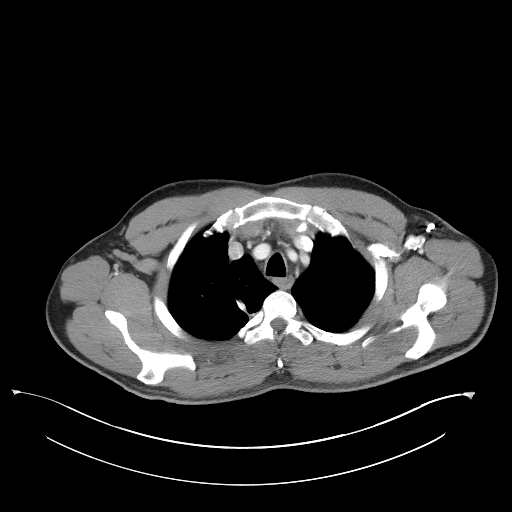
[im 132/147  bone]
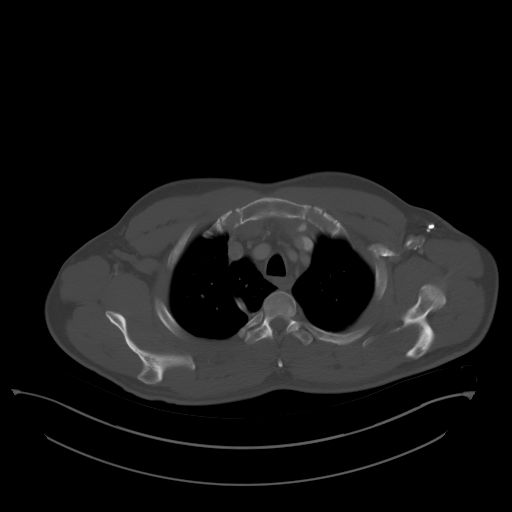

[Series 5: coronals · coronal · 0.90mm/px · 3 of 133 slices shown]
[im 27/133  mediastinal]
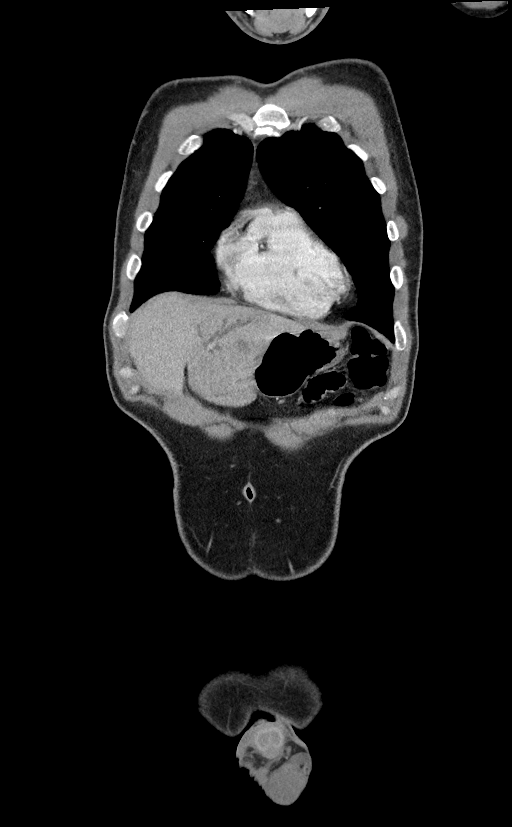
[im 53/133  mediastinal]
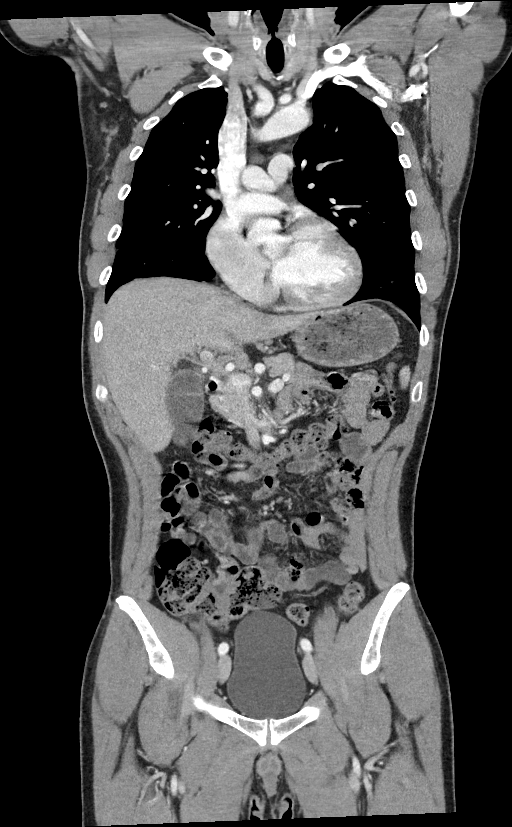
[im 80/133  mediastinal]
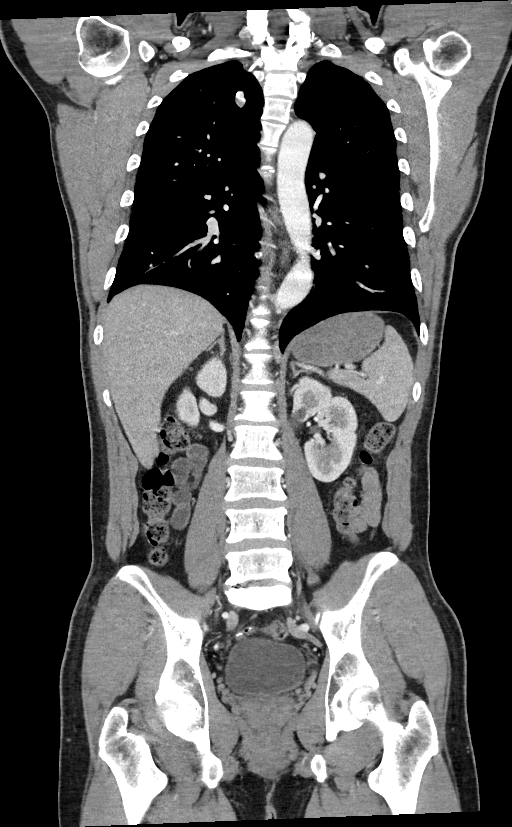

[12 of 36 positions shown; findings below may reference images not displayed]

RADIATION DOSE REDUCTION: This exam was performed according to the
departmental dose-optimization program which includes automated
exposure control, adjustment of the mA and/or kV according to
patient size and/or use of iterative reconstruction technique.

CONTRAST:  100mL OMNIPAQUE IOHEXOL 300 MG/ML  SOLN
FINDINGS: CHEST:
Ports and Devices: None.

Lungs/airways:

Incidentally noted azygous fissure. No focal consolidation. No
pulmonary nodule. No pulmonary mass. No pulmonary contusion or
laceration. No pneumatocele formation.

The central airways are patent.

Pleura: No pleural effusion. No pneumothorax. No hemothorax.

Lymph Nodes: No mediastinal, hilar, or axillary lymphadenopathy.

Mediastinum:

No pneumomediastinum. No aortic injury or mediastinal hematoma.

The thoracic aorta is normal in caliber. The heart is normal in
size. No significant pericardial effusion.

The esophagus is unremarkable.

The thyroid is unremarkable.

Chest Wall / Breasts: No chest wall mass.

Musculoskeletal: No acute rib or sternal fracture. No spinal
fracture.

ABDOMEN / PELVIS:
Liver: Not enlarged. Redemonstration of a left hepatic lobe poorly
defined, at least 5 cm, heterogeneous lesion. No laceration or
subcapsular hematoma.

Biliary System: The gallbladder is otherwise unremarkable with no
radio-opaque gallstones. Left intrahepatic biliary ductal
dilatation.

Pancreas: Normal pancreatic contour. No main pancreatic duct
dilatation.

Spleen: Not enlarged. No focal lesion. No laceration, subcapsular
hematoma, or vascular injury.

Adrenal Glands: No nodularity bilaterally.

Kidneys:

Bilateral kidneys enhance symmetrically. No hydronephrosis. No
contusion, laceration, or subcapsular hematoma. Bilateral fluid
density lesions within the kidneys likely represent simple renal
cysts. Simple renal cysts, in the absence of clinically indicated
signs/symptoms, require no independent follow-up. Subcentimeter
hypodensity too small to characterize.

No injury to the vascular structures or collecting systems. No
hydroureter.

The urinary bladder is unremarkable.

Bowel: Surgical changes related to a sigmoid resection. No small or
large bowel wall thickening or dilatation. Mobile medialized cecum.
The appendix is unremarkable.

Mesentery, Omentum, and Peritoneum: No simple free fluid ascites. No
pneumoperitoneum. No hemoperitoneum. No mesenteric hematoma
identified. No organized fluid collection.

Pelvic Organs: Prostate is unremarkable.

Lymph Nodes: Stable prominent but nonenlarged retroperitoneal lymph
node ([DATE]) measuring up to 0.7 cm. No abdominal, pelvic, inguinal
lymphadenopathy.

Vasculature: The main portal and splenic veins are patent. The
superior mesenteric vein is poorly visualized due to timing of
contrast. No abdominal aorta or iliac aneurysm. No active contrast
extravasation or pseudoaneurysm.

Musculoskeletal:

No significant soft tissue hematoma.

No acute pelvic fracture. No spinal fracture.
IMPRESSION: 1. A poorly defined at least 5 cm heterogeneous left hepatic lobe
lesion with associated adjacent left hepatic lobe biliary ductal
dilatation. Finding concerning for metastasis in the setting of
prior colon cancer versus primary malignancy. When the patient is
clinically stable and able to follow directions and hold their
breath (preferably as an outpatient) further evaluation with
dedicated abdominal MRI liver protocol should be considered.
2. Surgical changes related to a sigmoid resection.
3. No acute traumatic injury to the chest, abdomen, or pelvis.

4. No acute fracture or traumatic malalignment of the thoracic or
lumbar spine.

## 2023-11-29 IMAGING — US US ABDOMEN LIMITED
1 series · 14 of 25 positions shown · non-contrast
Comparison: None Available.

CLINICAL DATA: Right upper quadrant pain

EXAM:
ULTRASOUND ABDOMEN LIMITED RIGHT UPPER QUADRANT

[Series 1: us abdomen limited ruq (liver/gb) · 14 of 51 slices shown]
[im 1/51]
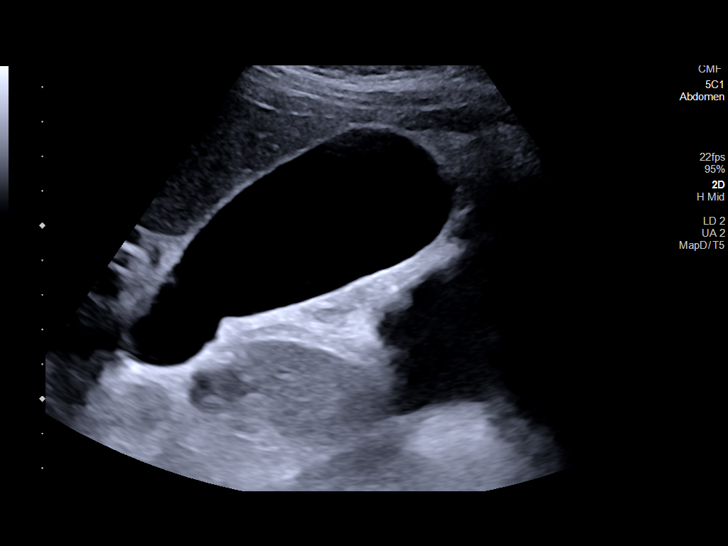
[im 5/51]
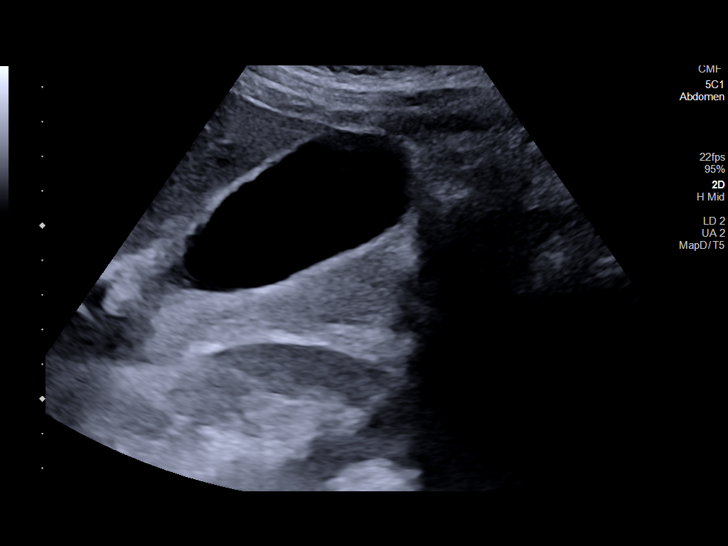
[im 9/51]
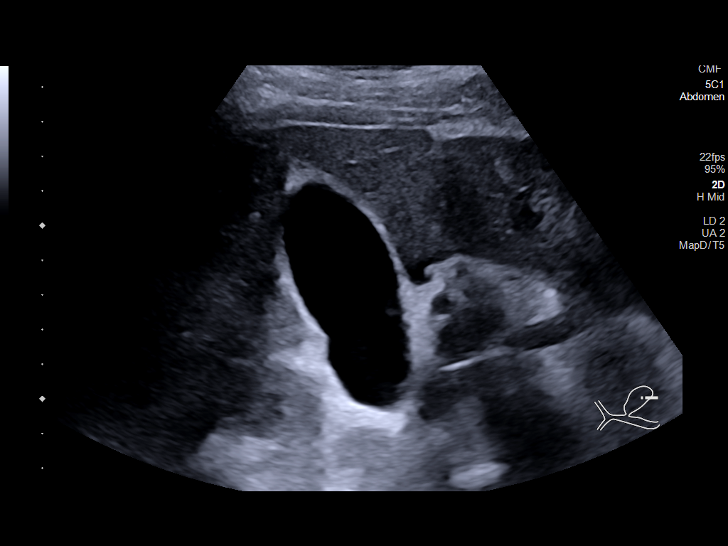
[im 13/51]
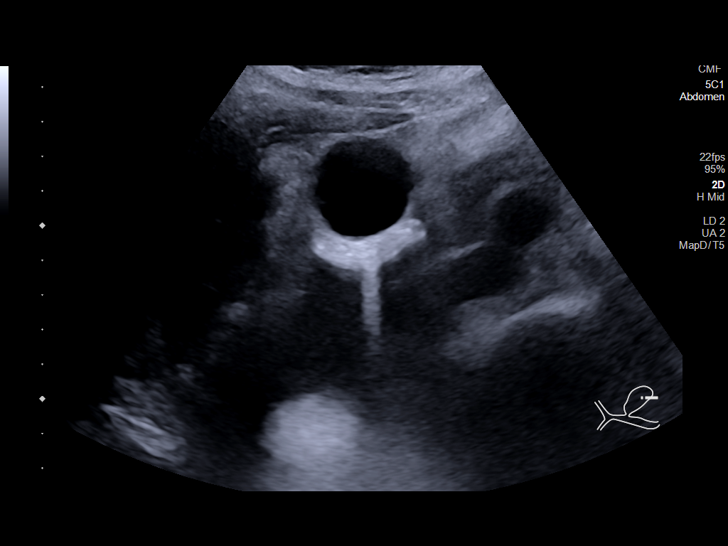
[im 17/51]
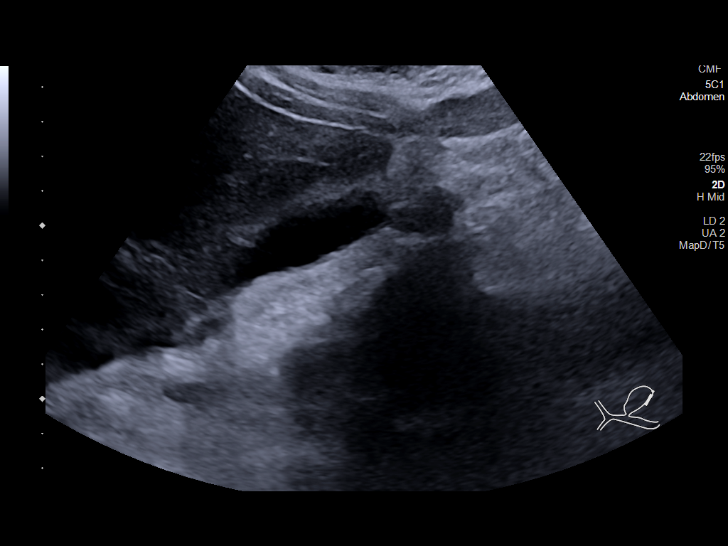
[im 19/51]
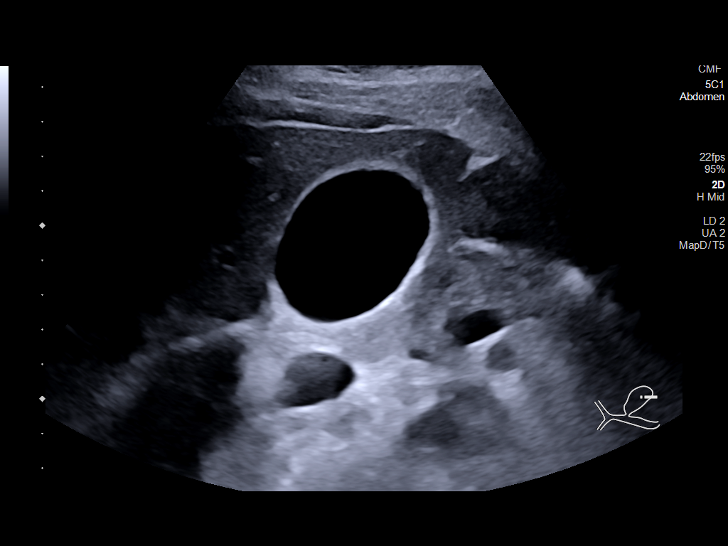
[im 23/51]
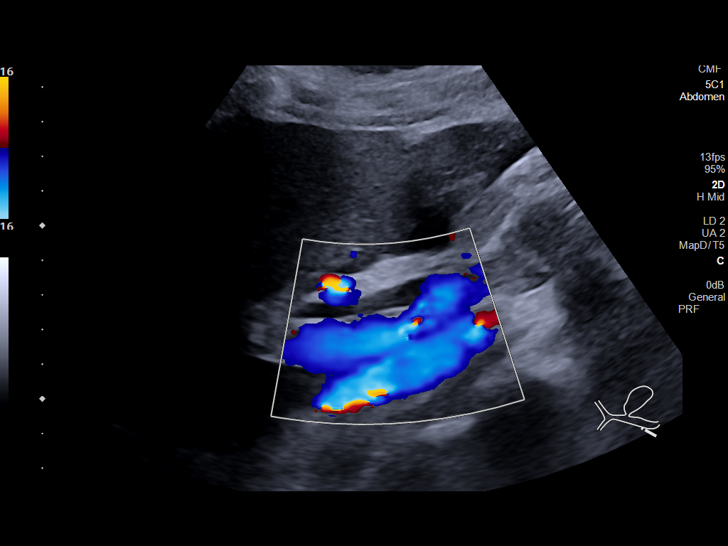
[im 28/51]
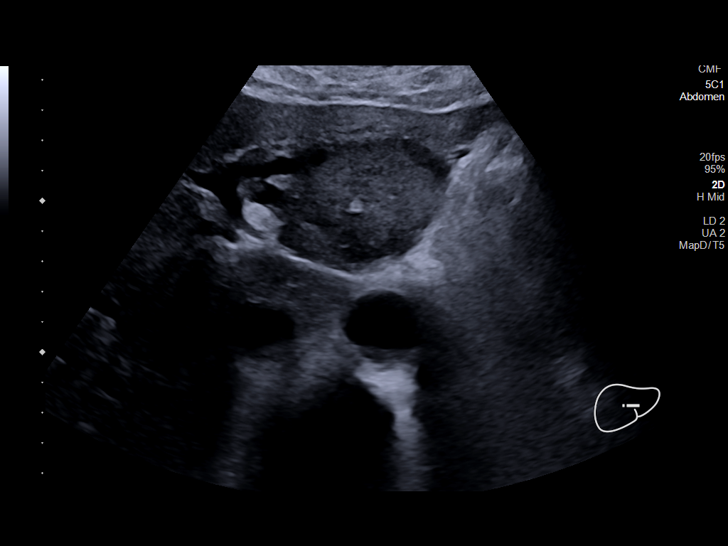
[im 32/51]
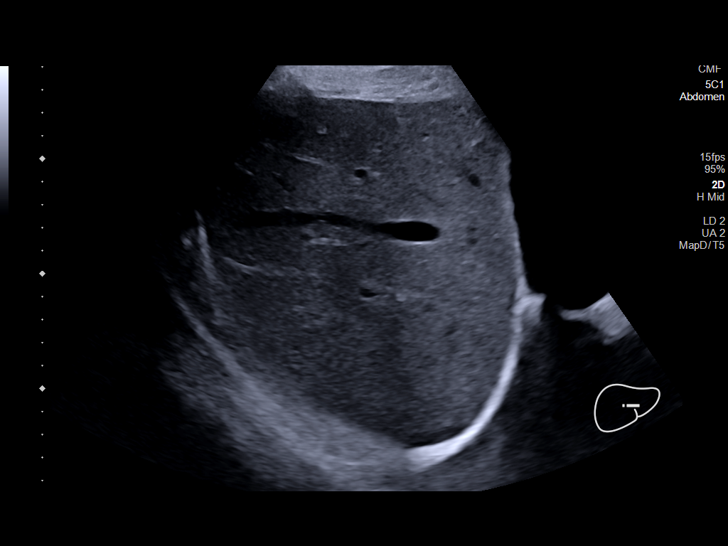
[im 34/51]
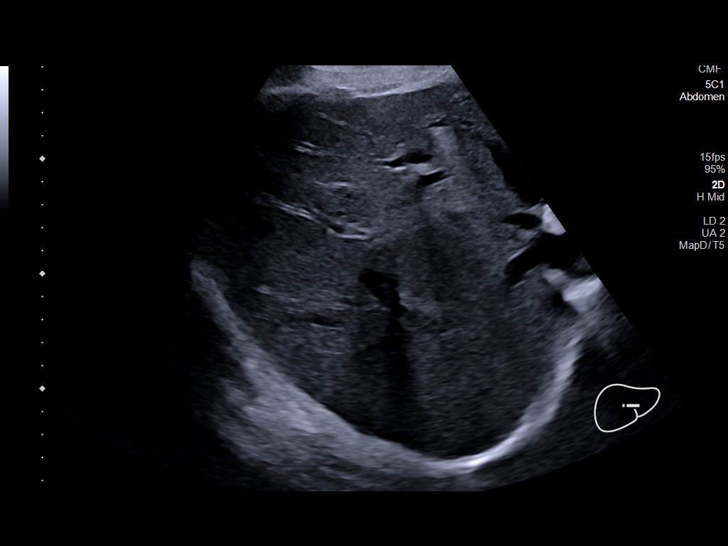
[im 38/51]
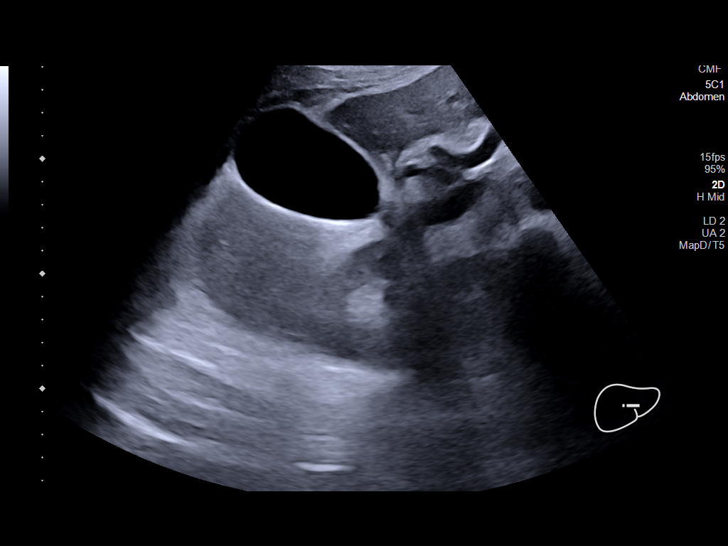
[im 42/51]
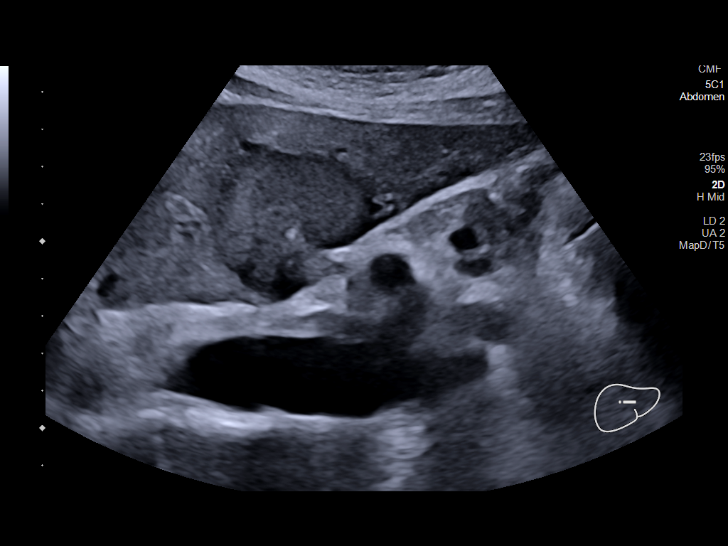
[im 46/51]
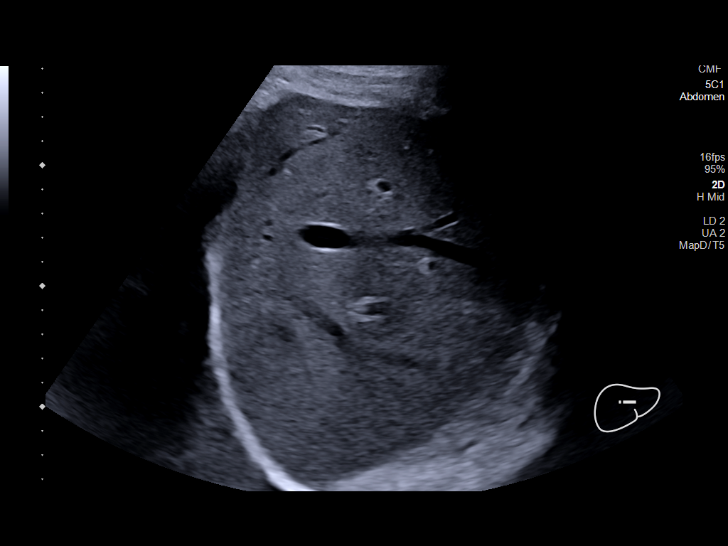
[im 51/51]
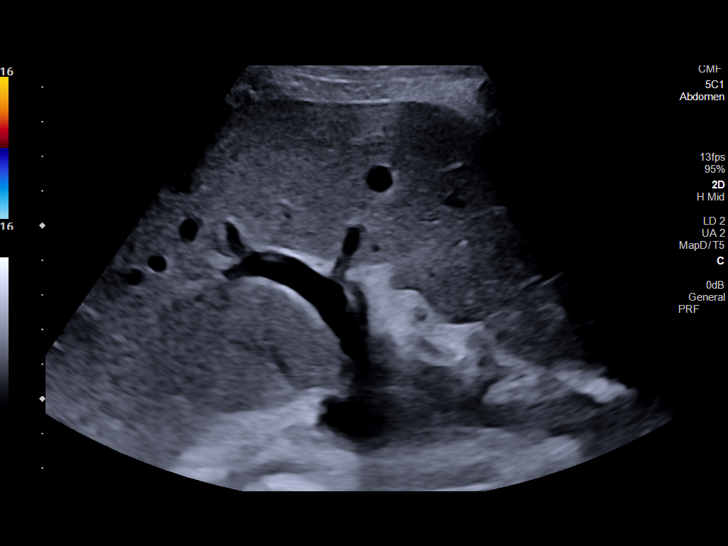

[14 of 25 positions shown; findings below may reference images not displayed]

FINDINGS: Gallbladder:

Gallbladder is distended. No gallstones or wall thickening
visualized. No sonographic Murphy sign noted by sonographer.

Common bile duct:

Diameter: 7 mm

Liver:

Mass of the left lobe of the liver measuring 3.7 x 2.0 x 4.7 cm with
hypoechoic halo. Within normal limits in parenchymal echogenicity.
Portal vein is patent on color Doppler imaging with normal direction
of blood flow towards the liver.

Other: None.
IMPRESSION: 1. Mass of the left lobe of the liver, measuring up to 4.7 cm.
Recommend contrast-enhanced liver MRI for further evaluation.
2. Distended gallbladder. No findings to suggest acute
cholecystitis.
3. Mildly dilated common bile duct, finding can also be further
evaluated with MRI/MRCP.

## 2024-02-02 ENCOUNTER — Other Ambulatory Visit: Payer: Self-pay | Admitting: Family Medicine

## 2024-02-02 DIAGNOSIS — I10 Essential (primary) hypertension: Secondary | ICD-10-CM

## 2024-04-21 LAB — HM COLONOSCOPY
